# Patient Record
Sex: Female | Born: 1945
Health system: Southern US, Community
[De-identification: ages and names within clinical notes are randomized; demographics above are authoritative.]

## PROBLEM LIST (undated history)

## (undated) DIAGNOSIS — R112 Nausea with vomiting, unspecified: Secondary | ICD-10-CM

## (undated) DIAGNOSIS — K219 Gastro-esophageal reflux disease without esophagitis: Secondary | ICD-10-CM

## (undated) DIAGNOSIS — Z9221 Personal history of antineoplastic chemotherapy: Secondary | ICD-10-CM

## (undated) DIAGNOSIS — R0600 Dyspnea, unspecified: Secondary | ICD-10-CM

## (undated) DIAGNOSIS — M1990 Unspecified osteoarthritis, unspecified site: Secondary | ICD-10-CM

## (undated) DIAGNOSIS — M8580 Other specified disorders of bone density and structure, unspecified site: Secondary | ICD-10-CM

## (undated) DIAGNOSIS — Z9889 Other specified postprocedural states: Secondary | ICD-10-CM

## (undated) DIAGNOSIS — E161 Other hypoglycemia: Secondary | ICD-10-CM

## (undated) DIAGNOSIS — C50919 Malignant neoplasm of unspecified site of unspecified female breast: Secondary | ICD-10-CM

## (undated) DIAGNOSIS — R42 Dizziness and giddiness: Secondary | ICD-10-CM

## (undated) HISTORY — PX: GANGLION CYST EXCISION: SHX1691

## (undated) HISTORY — DX: Gastro-esophageal reflux disease without esophagitis: K21.9

## (undated) HISTORY — DX: Malignant neoplasm of unspecified site of unspecified female breast: C50.919

## (undated) HISTORY — PX: TONSILLECTOMY: SUR1361

## (undated) HISTORY — DX: Other specified disorders of bone density and structure, unspecified site: M85.80

## (undated) HISTORY — PX: SHOULDER SURGERY: SHX246

## (undated) HISTORY — PX: OTHER SURGICAL HISTORY: SHX169

## (undated) HISTORY — PX: CATARACT EXTRACTION, BILATERAL: SHX1313

## (undated) HISTORY — DX: Dizziness and giddiness: R42

## (undated) HISTORY — PX: COLONOSCOPY: SHX174

## (undated) HISTORY — PX: TUBAL LIGATION: SHX77

---

## 2002-10-21 ENCOUNTER — Other Ambulatory Visit: Admission: RE | Admit: 2002-10-21 | Discharge: 2002-10-21 | Payer: Self-pay | Admitting: Obstetrics and Gynecology

## 2003-12-08 ENCOUNTER — Ambulatory Visit: Payer: Self-pay | Admitting: Family Medicine

## 2003-12-18 ENCOUNTER — Ambulatory Visit: Payer: Self-pay | Admitting: Internal Medicine

## 2003-12-21 HISTORY — PX: OTHER SURGICAL HISTORY: SHX169

## 2004-01-03 ENCOUNTER — Ambulatory Visit: Payer: Self-pay

## 2004-01-10 ENCOUNTER — Ambulatory Visit: Payer: Self-pay | Admitting: Family Medicine

## 2004-07-31 ENCOUNTER — Ambulatory Visit: Payer: Self-pay | Admitting: Family Medicine

## 2005-01-20 DIAGNOSIS — R42 Dizziness and giddiness: Secondary | ICD-10-CM

## 2005-01-20 HISTORY — DX: Dizziness and giddiness: R42

## 2005-02-19 ENCOUNTER — Ambulatory Visit: Payer: Self-pay | Admitting: Family Medicine

## 2005-03-25 ENCOUNTER — Ambulatory Visit: Payer: Self-pay | Admitting: Family Medicine

## 2005-04-01 ENCOUNTER — Encounter: Admission: RE | Admit: 2005-04-01 | Discharge: 2005-04-01 | Payer: Self-pay | Admitting: Family Medicine

## 2005-04-03 ENCOUNTER — Ambulatory Visit: Payer: Self-pay | Admitting: Family Medicine

## 2005-04-06 ENCOUNTER — Encounter: Admission: RE | Admit: 2005-04-06 | Discharge: 2005-04-06 | Payer: Self-pay | Admitting: Family Medicine

## 2005-04-15 ENCOUNTER — Ambulatory Visit: Payer: Self-pay | Admitting: Family Medicine

## 2005-04-24 ENCOUNTER — Ambulatory Visit: Payer: Self-pay | Admitting: Family Medicine

## 2005-05-09 ENCOUNTER — Ambulatory Visit: Payer: Self-pay | Admitting: Family Medicine

## 2005-09-18 ENCOUNTER — Ambulatory Visit: Payer: Self-pay | Admitting: Family Medicine

## 2005-10-20 HISTORY — PX: HAND SURGERY: SHX662

## 2006-02-24 ENCOUNTER — Ambulatory Visit: Payer: Self-pay | Admitting: Family Medicine

## 2006-04-02 ENCOUNTER — Encounter: Payer: Self-pay | Admitting: Family Medicine

## 2007-08-03 ENCOUNTER — Encounter: Admission: RE | Admit: 2007-08-03 | Discharge: 2007-08-03 | Payer: Self-pay | Admitting: Family Medicine

## 2007-08-12 ENCOUNTER — Encounter: Admission: RE | Admit: 2007-08-12 | Discharge: 2007-08-12 | Payer: Self-pay | Admitting: Family Medicine

## 2007-08-12 ENCOUNTER — Encounter: Payer: Self-pay | Admitting: Family Medicine

## 2007-08-12 ENCOUNTER — Encounter (INDEPENDENT_AMBULATORY_CARE_PROVIDER_SITE_OTHER): Payer: Self-pay | Admitting: Diagnostic Radiology

## 2007-08-19 ENCOUNTER — Encounter: Admission: RE | Admit: 2007-08-19 | Discharge: 2007-08-19 | Payer: Self-pay | Admitting: Family Medicine

## 2007-08-21 HISTORY — PX: MASTECTOMY: SHX3

## 2007-08-23 ENCOUNTER — Encounter: Payer: Self-pay | Admitting: Family Medicine

## 2007-08-24 ENCOUNTER — Ambulatory Visit: Payer: Self-pay | Admitting: Oncology

## 2007-09-01 ENCOUNTER — Encounter: Payer: Self-pay | Admitting: Family Medicine

## 2007-09-01 ENCOUNTER — Ambulatory Visit (HOSPITAL_COMMUNITY): Admission: RE | Admit: 2007-09-01 | Discharge: 2007-09-01 | Payer: Self-pay | Admitting: Oncology

## 2007-09-03 ENCOUNTER — Ambulatory Visit (HOSPITAL_COMMUNITY): Admission: RE | Admit: 2007-09-03 | Discharge: 2007-09-03 | Payer: Self-pay | Admitting: Oncology

## 2007-09-06 ENCOUNTER — Encounter: Payer: Self-pay | Admitting: Oncology

## 2007-09-06 ENCOUNTER — Ambulatory Visit: Payer: Self-pay

## 2007-09-16 ENCOUNTER — Encounter: Payer: Self-pay | Admitting: Family Medicine

## 2007-09-16 ENCOUNTER — Encounter (INDEPENDENT_AMBULATORY_CARE_PROVIDER_SITE_OTHER): Payer: Self-pay | Admitting: Surgery

## 2007-09-16 ENCOUNTER — Ambulatory Visit (HOSPITAL_BASED_OUTPATIENT_CLINIC_OR_DEPARTMENT_OTHER): Admission: RE | Admit: 2007-09-16 | Discharge: 2007-09-17 | Payer: Self-pay | Admitting: Surgery

## 2007-09-16 DIAGNOSIS — C50919 Malignant neoplasm of unspecified site of unspecified female breast: Secondary | ICD-10-CM

## 2007-09-16 HISTORY — DX: Malignant neoplasm of unspecified site of unspecified female breast: C50.919

## 2007-10-05 ENCOUNTER — Ambulatory Visit: Payer: Self-pay | Admitting: Oncology

## 2007-10-07 ENCOUNTER — Encounter: Payer: Self-pay | Admitting: Family Medicine

## 2007-10-12 LAB — CBC WITH DIFFERENTIAL/PLATELET
Basophils Absolute: 0.1 10*3/uL (ref 0.0–0.1)
Eosinophils Absolute: 0.2 10*3/uL (ref 0.0–0.5)
HGB: 13.7 g/dL (ref 11.6–15.9)
MCV: 86.4 fL (ref 81.0–101.0)
MONO#: 0.7 10*3/uL (ref 0.1–0.9)
MONO%: 10.2 % (ref 0.0–13.0)
NEUT#: 4.4 10*3/uL (ref 1.5–6.5)
RDW: 11.7 % (ref 11.3–14.5)
lymph#: 1.5 10*3/uL (ref 0.9–3.3)

## 2007-10-19 LAB — CBC WITH DIFFERENTIAL/PLATELET
Basophils Absolute: 0 10*3/uL (ref 0.0–0.1)
Eosinophils Absolute: 0.2 10*3/uL (ref 0.0–0.5)
HCT: 39 % (ref 34.8–46.6)
HGB: 13.4 g/dL (ref 11.6–15.9)
LYMPH%: 30.1 % (ref 14.0–48.0)
MCV: 88.7 fL (ref 81.0–101.0)
MONO%: 7.8 % (ref 0.0–13.0)
NEUT#: 3.6 10*3/uL (ref 1.5–6.5)
NEUT%: 58.8 % (ref 39.6–76.8)
Platelets: 243 10*3/uL (ref 145–400)

## 2007-10-25 ENCOUNTER — Encounter: Payer: Self-pay | Admitting: Family Medicine

## 2007-10-25 LAB — CBC WITH DIFFERENTIAL/PLATELET
BASO%: 0.7 % (ref 0.0–2.0)
Basophils Absolute: 0 10*3/uL (ref 0.0–0.1)
EOS%: 1.9 % (ref 0.0–7.0)
HGB: 13.1 g/dL (ref 11.6–15.9)
MCH: 30.6 pg (ref 26.0–34.0)
RDW: 12.4 % (ref 11.3–14.5)
lymph#: 1.4 10*3/uL (ref 0.9–3.3)

## 2007-11-02 ENCOUNTER — Encounter: Payer: Self-pay | Admitting: Family Medicine

## 2007-11-02 LAB — CBC WITH DIFFERENTIAL/PLATELET
Basophils Absolute: 0 10*3/uL (ref 0.0–0.1)
Eosinophils Absolute: 0.1 10*3/uL (ref 0.0–0.5)
HGB: 13.1 g/dL (ref 11.6–15.9)
MCV: 89.3 fL (ref 81.0–101.0)
MONO#: 0.4 10*3/uL (ref 0.1–0.9)
NEUT#: 4.3 10*3/uL (ref 1.5–6.5)
RDW: 12.9 % (ref 11.3–14.5)
lymph#: 1.5 10*3/uL (ref 0.9–3.3)

## 2007-11-09 ENCOUNTER — Encounter: Payer: Self-pay | Admitting: Family Medicine

## 2007-11-09 LAB — CBC WITH DIFFERENTIAL/PLATELET
Basophils Absolute: 0 10*3/uL (ref 0.0–0.1)
Eosinophils Absolute: 0.1 10*3/uL (ref 0.0–0.5)
HCT: 37.7 % (ref 34.8–46.6)
HGB: 12.9 g/dL (ref 11.6–15.9)
LYMPH%: 21.5 % (ref 14.0–48.0)
MCV: 89.8 fL (ref 81.0–101.0)
MONO%: 11.6 % (ref 0.0–13.0)
NEUT#: 4.5 10*3/uL (ref 1.5–6.5)
Platelets: 324 10*3/uL (ref 145–400)

## 2007-11-09 LAB — COMPREHENSIVE METABOLIC PANEL
Albumin: 3.9 g/dL (ref 3.5–5.2)
Alkaline Phosphatase: 70 U/L (ref 39–117)
BUN: 17 mg/dL (ref 6–23)
CO2: 22 mEq/L (ref 19–32)
Glucose, Bld: 99 mg/dL (ref 70–99)
Potassium: 4.2 mEq/L (ref 3.5–5.3)
Total Bilirubin: 0.4 mg/dL (ref 0.3–1.2)

## 2007-11-16 LAB — CBC WITH DIFFERENTIAL/PLATELET
Basophils Absolute: 0.1 10*3/uL (ref 0.0–0.1)
Eosinophils Absolute: 0.2 10*3/uL (ref 0.0–0.5)
HGB: 12.9 g/dL (ref 11.6–15.9)
MCV: 89.9 fL (ref 81.0–101.0)
MONO#: 0.5 10*3/uL (ref 0.1–0.9)
MONO%: 8 % (ref 0.0–13.0)
NEUT#: 4 10*3/uL (ref 1.5–6.5)
RDW: 13.4 % (ref 11.3–14.5)
WBC: 6.2 10*3/uL (ref 3.9–10.0)

## 2007-11-23 ENCOUNTER — Ambulatory Visit: Payer: Self-pay | Admitting: Oncology

## 2007-11-23 LAB — CBC WITH DIFFERENTIAL/PLATELET
BASO%: 0.7 % (ref 0.0–2.0)
EOS%: 3.7 % (ref 0.0–7.0)
LYMPH%: 20.1 % (ref 14.0–48.0)
MCH: 31 pg (ref 26.0–34.0)
MCHC: 34.1 g/dL (ref 32.0–36.0)
MONO#: 0.6 10*3/uL (ref 0.1–0.9)
RBC: 3.9 10*6/uL (ref 3.70–5.32)
WBC: 5.8 10*3/uL (ref 3.9–10.0)
lymph#: 1.2 10*3/uL (ref 0.9–3.3)

## 2007-11-30 LAB — CBC WITH DIFFERENTIAL/PLATELET
BASO%: 0.8 % (ref 0.0–2.0)
EOS%: 2.5 % (ref 0.0–7.0)
HCT: 34.5 % — ABNORMAL LOW (ref 34.8–46.6)
HGB: 12 g/dL (ref 11.6–15.9)
MCH: 31.3 pg (ref 26.0–34.0)
MCHC: 34.7 g/dL (ref 32.0–36.0)
MONO#: 0.6 10*3/uL (ref 0.1–0.9)
NEUT%: 64.4 % (ref 39.6–76.8)
RDW: 14.3 % (ref 11.3–14.5)
WBC: 6.9 10*3/uL (ref 3.9–10.0)
lymph#: 1.6 10*3/uL (ref 0.9–3.3)

## 2007-12-07 LAB — COMPREHENSIVE METABOLIC PANEL
ALT: 9 U/L (ref 0–35)
AST: 11 U/L (ref 0–37)
Albumin: 3.8 g/dL (ref 3.5–5.2)
CO2: 26 mEq/L (ref 19–32)
Calcium: 9 mg/dL (ref 8.4–10.5)
Chloride: 107 mEq/L (ref 96–112)
Creatinine, Ser: 0.9 mg/dL (ref 0.40–1.20)
Potassium: 4.5 mEq/L (ref 3.5–5.3)
Total Protein: 6.2 g/dL (ref 6.0–8.3)

## 2007-12-07 LAB — CBC WITH DIFFERENTIAL/PLATELET
BASO%: 1.7 % (ref 0.0–2.0)
EOS%: 2.2 % (ref 0.0–7.0)
MCH: 30.5 pg (ref 26.0–34.0)
MCHC: 34.7 g/dL (ref 32.0–36.0)
MONO#: 0.8 10*3/uL (ref 0.1–0.9)
NEUT%: 64.4 % (ref 39.6–76.8)
RBC: 3.98 10*6/uL (ref 3.70–5.32)
RDW: 12.6 % (ref 11.3–14.5)
WBC: 7.2 10*3/uL (ref 3.9–10.0)
lymph#: 1.4 10*3/uL (ref 0.9–3.3)

## 2007-12-14 LAB — CBC WITH DIFFERENTIAL/PLATELET
BASO%: 1.3 % (ref 0.0–2.0)
Basophils Absolute: 0.1 10*3/uL (ref 0.0–0.1)
EOS%: 2.7 % (ref 0.0–7.0)
HCT: 38.4 % (ref 34.8–46.6)
HGB: 13 g/dL (ref 11.6–15.9)
MCH: 31 pg (ref 26.0–34.0)
MONO#: 0.5 10*3/uL (ref 0.1–0.9)
NEUT#: 4.2 10*3/uL (ref 1.5–6.5)
NEUT%: 64 % (ref 39.6–76.8)
RDW: 13.9 % (ref 11.3–14.5)
WBC: 6.6 10*3/uL (ref 3.9–10.0)
lymph#: 1.6 10*3/uL (ref 0.9–3.3)

## 2007-12-21 ENCOUNTER — Encounter: Payer: Self-pay | Admitting: Family Medicine

## 2007-12-21 LAB — CBC WITH DIFFERENTIAL/PLATELET
BASO%: 0.6 % (ref 0.0–2.0)
LYMPH%: 22.7 % (ref 14.0–48.0)
MCH: 31.3 pg (ref 26.0–34.0)
MCHC: 34.2 g/dL (ref 32.0–36.0)
MCV: 91.7 fL (ref 81.0–101.0)
MONO%: 5.4 % (ref 0.0–13.0)
Platelets: 293 10*3/uL (ref 145–400)
RBC: 3.96 10*6/uL (ref 3.70–5.32)

## 2008-01-03 ENCOUNTER — Encounter: Payer: Self-pay | Admitting: Family Medicine

## 2008-01-03 LAB — CBC WITH DIFFERENTIAL/PLATELET
BASO%: 0.7 % (ref 0.0–2.0)
EOS%: 1.4 % (ref 0.0–7.0)
LYMPH%: 17.5 % (ref 14.0–48.0)
MCH: 31.5 pg (ref 26.0–34.0)
MCHC: 34.5 g/dL (ref 32.0–36.0)
MONO#: 0.9 10*3/uL (ref 0.1–0.9)
RBC: 4.01 10*6/uL (ref 3.70–5.32)
WBC: 8.7 10*3/uL (ref 3.9–10.0)
lymph#: 1.5 10*3/uL (ref 0.9–3.3)

## 2008-01-03 LAB — COMPREHENSIVE METABOLIC PANEL
ALT: 16 U/L (ref 0–35)
AST: 15 U/L (ref 0–37)
CO2: 23 mEq/L (ref 19–32)
Chloride: 106 mEq/L (ref 96–112)
Sodium: 143 mEq/L (ref 135–145)
Total Bilirubin: 0.4 mg/dL (ref 0.3–1.2)
Total Protein: 6.7 g/dL (ref 6.0–8.3)

## 2008-01-10 ENCOUNTER — Encounter: Payer: Self-pay | Admitting: Oncology

## 2008-01-10 ENCOUNTER — Ambulatory Visit: Payer: Self-pay

## 2008-01-11 ENCOUNTER — Ambulatory Visit: Payer: Self-pay | Admitting: Oncology

## 2008-01-11 LAB — CBC WITH DIFFERENTIAL/PLATELET
HCT: 37.7 % (ref 34.8–46.6)
LYMPH%: 27.5 % (ref 14.0–48.0)
MCH: 31.6 pg (ref 26.0–34.0)
MCHC: 34.4 g/dL (ref 32.0–36.0)
MONO#: 0.4 10*3/uL (ref 0.1–0.9)
MONO%: 7.3 % (ref 0.0–13.0)
NEUT%: 62.6 % (ref 39.6–76.8)
Platelets: 336 10*3/uL (ref 145–400)
RBC: 4.1 10*6/uL (ref 3.70–5.32)
WBC: 5.9 10*3/uL (ref 3.9–10.0)

## 2008-01-18 ENCOUNTER — Encounter: Payer: Self-pay | Admitting: Family Medicine

## 2008-01-18 LAB — COMPREHENSIVE METABOLIC PANEL
Albumin: 4.1 g/dL (ref 3.5–5.2)
BUN: 16 mg/dL (ref 6–23)
CO2: 24 mEq/L (ref 19–32)
Calcium: 8.9 mg/dL (ref 8.4–10.5)
Chloride: 108 mEq/L (ref 96–112)
Glucose, Bld: 86 mg/dL (ref 70–99)
Potassium: 4.1 mEq/L (ref 3.5–5.3)

## 2008-01-18 LAB — CBC WITH DIFFERENTIAL/PLATELET
BASO%: 0.7 % (ref 0.0–2.0)
LYMPH%: 25.5 % (ref 14.0–48.0)
MCH: 31.9 pg (ref 26.0–34.0)
MCHC: 34.7 g/dL (ref 32.0–36.0)
MCV: 92.1 fL (ref 81.0–101.0)
MONO%: 5.3 % (ref 0.0–13.0)
NEUT%: 67.1 % (ref 39.6–76.8)
Platelets: 280 10*3/uL (ref 145–400)
RBC: 3.84 10*6/uL (ref 3.70–5.32)
WBC: 5.9 10*3/uL (ref 3.9–10.0)

## 2008-01-25 LAB — COMPREHENSIVE METABOLIC PANEL
ALT: 13 U/L (ref 0–35)
AST: 14 U/L (ref 0–37)
Albumin: 4.3 g/dL (ref 3.5–5.2)
Calcium: 8.9 mg/dL (ref 8.4–10.5)
Glucose, Bld: 86 mg/dL (ref 70–99)
Sodium: 139 mEq/L (ref 135–145)
Total Protein: 6.6 g/dL (ref 6.0–8.3)

## 2008-01-25 LAB — CBC WITH DIFFERENTIAL/PLATELET
Eosinophils Absolute: 0.1 10*3/uL (ref 0.0–0.5)
LYMPH%: 25.9 % (ref 14.0–48.0)
MCV: 93.2 fL (ref 81.0–101.0)
MONO%: 5.6 % (ref 0.0–13.0)
NEUT#: 4.6 10*3/uL (ref 1.5–6.5)
Platelets: 314 10*3/uL (ref 145–400)
RBC: 3.99 10*6/uL (ref 3.70–5.32)

## 2008-02-15 ENCOUNTER — Encounter: Payer: Self-pay | Admitting: Family Medicine

## 2008-02-15 LAB — COMPREHENSIVE METABOLIC PANEL
ALT: 10 U/L (ref 0–35)
AST: 14 U/L (ref 0–37)
Alkaline Phosphatase: 55 U/L (ref 39–117)
Creatinine, Ser: 0.85 mg/dL (ref 0.40–1.20)
Sodium: 140 mEq/L (ref 135–145)
Total Bilirubin: 0.4 mg/dL (ref 0.3–1.2)
Total Protein: 6.2 g/dL (ref 6.0–8.3)

## 2008-03-03 ENCOUNTER — Ambulatory Visit: Payer: Self-pay | Admitting: Oncology

## 2008-03-07 ENCOUNTER — Encounter: Payer: Self-pay | Admitting: Family Medicine

## 2008-03-07 LAB — CBC WITH DIFFERENTIAL/PLATELET
Basophils Absolute: 0 10*3/uL (ref 0.0–0.1)
Eosinophils Absolute: 0.1 10*3/uL (ref 0.0–0.5)
HCT: 41.7 % (ref 34.8–46.6)
HGB: 14 g/dL (ref 11.6–15.9)
LYMPH%: 21.4 % (ref 14.0–48.0)
MCV: 91.3 fL (ref 81.0–101.0)
MONO%: 6.2 % (ref 0.0–13.0)
NEUT#: 5 10*3/uL (ref 1.5–6.5)
Platelets: 297 10*3/uL (ref 145–400)
RDW: 13.3 % (ref 11.3–14.5)

## 2008-03-07 LAB — COMPREHENSIVE METABOLIC PANEL
Alkaline Phosphatase: 66 U/L (ref 39–117)
BUN: 16 mg/dL (ref 6–23)
Creatinine, Ser: 0.89 mg/dL (ref 0.40–1.20)
Glucose, Bld: 67 mg/dL — ABNORMAL LOW (ref 70–99)
Total Bilirubin: 0.5 mg/dL (ref 0.3–1.2)

## 2008-03-28 LAB — CBC WITH DIFFERENTIAL/PLATELET
Basophils Absolute: 0.1 10*3/uL (ref 0.0–0.1)
Eosinophils Absolute: 0.1 10*3/uL (ref 0.0–0.5)
HGB: 13.4 g/dL (ref 11.6–15.9)
MCV: 89.7 fL (ref 79.5–101.0)
MONO#: 0.7 10*3/uL (ref 0.1–0.9)
MONO%: 8.6 % (ref 0.0–14.0)
NEUT#: 5.2 10*3/uL (ref 1.5–6.5)
RBC: 4.42 10*6/uL (ref 3.70–5.45)
RDW: 12.6 % (ref 11.2–14.5)
WBC: 7.9 10*3/uL (ref 3.9–10.3)
lymph#: 1.9 10*3/uL (ref 0.9–3.3)

## 2008-04-05 ENCOUNTER — Ambulatory Visit: Admission: RE | Admit: 2008-04-05 | Discharge: 2008-04-05 | Payer: Self-pay | Admitting: Oncology

## 2008-04-05 ENCOUNTER — Ambulatory Visit: Payer: Self-pay | Admitting: Cardiology

## 2008-04-05 ENCOUNTER — Encounter: Payer: Self-pay | Admitting: Oncology

## 2008-04-14 ENCOUNTER — Ambulatory Visit: Payer: Self-pay | Admitting: Oncology

## 2008-04-18 ENCOUNTER — Encounter: Payer: Self-pay | Admitting: Family Medicine

## 2008-04-18 LAB — CBC WITH DIFFERENTIAL/PLATELET
Basophils Absolute: 0 10*3/uL (ref 0.0–0.1)
Eosinophils Absolute: 0.1 10*3/uL (ref 0.0–0.5)
HGB: 13.7 g/dL (ref 11.6–15.9)
LYMPH%: 22.9 % (ref 14.0–49.7)
MCV: 88.7 fL (ref 79.5–101.0)
MONO#: 0.5 10*3/uL (ref 0.1–0.9)
MONO%: 7.1 % (ref 0.0–14.0)
NEUT#: 4.7 10*3/uL (ref 1.5–6.5)
Platelets: 255 10*3/uL (ref 145–400)
RBC: 4.56 10*6/uL (ref 3.70–5.45)
WBC: 6.8 10*3/uL (ref 3.9–10.3)

## 2008-04-18 LAB — COMPREHENSIVE METABOLIC PANEL
ALT: 9 U/L (ref 0–35)
Alkaline Phosphatase: 62 U/L (ref 39–117)
Sodium: 139 mEq/L (ref 135–145)
Total Bilirubin: 0.4 mg/dL (ref 0.3–1.2)
Total Protein: 6.7 g/dL (ref 6.0–8.3)

## 2008-05-02 ENCOUNTER — Encounter: Payer: Self-pay | Admitting: Family Medicine

## 2008-05-02 ENCOUNTER — Telehealth: Payer: Self-pay | Admitting: Family Medicine

## 2008-05-06 ENCOUNTER — Encounter: Payer: Self-pay | Admitting: Family Medicine

## 2008-05-09 LAB — CBC WITH DIFFERENTIAL/PLATELET
BASO%: 0.5 % (ref 0.0–2.0)
Basophils Absolute: 0 10*3/uL (ref 0.0–0.1)
EOS%: 1.1 % (ref 0.0–7.0)
HGB: 14 g/dL (ref 11.6–15.9)
MCH: 30 pg (ref 25.1–34.0)
MCHC: 34.1 g/dL (ref 31.5–36.0)
MCV: 87.9 fL (ref 79.5–101.0)
MONO%: 7.9 % (ref 0.0–14.0)
RBC: 4.67 10*6/uL (ref 3.70–5.45)
RDW: 12.5 % (ref 11.2–14.5)
lymph#: 1.5 10*3/uL (ref 0.9–3.3)

## 2008-05-26 ENCOUNTER — Ambulatory Visit: Payer: Self-pay | Admitting: Oncology

## 2008-05-30 ENCOUNTER — Encounter: Payer: Self-pay | Admitting: Family Medicine

## 2008-05-30 LAB — CBC WITH DIFFERENTIAL/PLATELET
Basophils Absolute: 0 10*3/uL (ref 0.0–0.1)
EOS%: 1.4 % (ref 0.0–7.0)
Eosinophils Absolute: 0.1 10*3/uL (ref 0.0–0.5)
RBC: 4.51 10*6/uL (ref 3.70–5.45)
RDW: 12.8 % (ref 11.2–14.5)
WBC: 7.2 10*3/uL (ref 3.9–10.3)
lymph#: 1.5 10*3/uL (ref 0.9–3.3)

## 2008-05-30 LAB — COMPREHENSIVE METABOLIC PANEL
AST: 16 U/L (ref 0–37)
Albumin: 4.2 g/dL (ref 3.5–5.2)
Alkaline Phosphatase: 64 U/L (ref 39–117)
BUN: 17 mg/dL (ref 6–23)
Calcium: 9.4 mg/dL (ref 8.4–10.5)
Chloride: 107 mEq/L (ref 96–112)
Potassium: 4.1 mEq/L (ref 3.5–5.3)
Sodium: 142 mEq/L (ref 135–145)
Total Protein: 6.7 g/dL (ref 6.0–8.3)

## 2008-06-20 LAB — CBC WITH DIFFERENTIAL/PLATELET
EOS%: 1.8 % (ref 0.0–7.0)
MCH: 30.3 pg (ref 25.1–34.0)
MCHC: 34.3 g/dL (ref 31.5–36.0)
MCV: 88.3 fL (ref 79.5–101.0)
MONO%: 8.2 % (ref 0.0–14.0)
RBC: 4.36 10*6/uL (ref 3.70–5.45)
RDW: 13.4 % (ref 11.2–14.5)

## 2008-06-27 ENCOUNTER — Ambulatory Visit: Payer: Self-pay | Admitting: Family Medicine

## 2008-06-30 ENCOUNTER — Telehealth: Payer: Self-pay | Admitting: Family Medicine

## 2008-07-04 ENCOUNTER — Ambulatory Visit: Payer: Self-pay | Admitting: Internal Medicine

## 2008-07-04 ENCOUNTER — Ambulatory Visit: Admission: RE | Admit: 2008-07-04 | Discharge: 2008-07-04 | Payer: Self-pay | Admitting: Oncology

## 2008-07-04 ENCOUNTER — Encounter: Payer: Self-pay | Admitting: Oncology

## 2008-07-07 ENCOUNTER — Ambulatory Visit: Payer: Self-pay | Admitting: Oncology

## 2008-07-11 LAB — CBC WITH DIFFERENTIAL/PLATELET
BASO%: 0.6 % (ref 0.0–2.0)
Eosinophils Absolute: 0.1 10*3/uL (ref 0.0–0.5)
MONO#: 0.7 10*3/uL (ref 0.1–0.9)
MONO%: 8.8 % (ref 0.0–14.0)
NEUT#: 5 10*3/uL (ref 1.5–6.5)
RBC: 4.41 10*6/uL (ref 3.70–5.45)
RDW: 13.3 % (ref 11.2–14.5)
WBC: 7.6 10*3/uL (ref 3.9–10.3)

## 2008-07-11 LAB — COMPREHENSIVE METABOLIC PANEL
ALT: 11 U/L (ref 0–35)
Albumin: 4.1 g/dL (ref 3.5–5.2)
Alkaline Phosphatase: 64 U/L (ref 39–117)
CO2: 23 mEq/L (ref 19–32)
Glucose, Bld: 88 mg/dL (ref 70–99)
Potassium: 4.3 mEq/L (ref 3.5–5.3)
Sodium: 140 mEq/L (ref 135–145)
Total Protein: 6.6 g/dL (ref 6.0–8.3)

## 2008-07-31 ENCOUNTER — Ambulatory Visit: Payer: Self-pay | Admitting: Family Medicine

## 2008-07-31 LAB — CONVERTED CEMR LAB
BUN: 15 mg/dL (ref 6–23)
CO2: 28 meq/L (ref 19–32)
Calcium: 9 mg/dL (ref 8.4–10.5)
Chloride: 107 meq/L (ref 96–112)
Creatinine, Ser: 0.9 mg/dL (ref 0.4–1.2)
GFR calc non Af Amer: 67.06 mL/min (ref >60)
Glucose, Bld: 74 mg/dL (ref 70–99)
Potassium: 4.4 meq/L (ref 3.5–5.1)
Sodium: 142 meq/L (ref 135–145)

## 2008-08-01 LAB — CBC WITH DIFFERENTIAL/PLATELET
Basophils Absolute: 0 10*3/uL (ref 0.0–0.1)
EOS%: 1.2 % (ref 0.0–7.0)
HCT: 38.5 % (ref 34.8–46.6)
HGB: 13.4 g/dL (ref 11.6–15.9)
MCH: 30.7 pg (ref 25.1–34.0)
MCV: 88 fL (ref 79.5–101.0)
MONO%: 8.4 % (ref 0.0–14.0)
NEUT%: 65.7 % (ref 38.4–76.8)
RDW: 12.9 % (ref 11.2–14.5)

## 2008-08-03 ENCOUNTER — Encounter: Admission: RE | Admit: 2008-08-03 | Discharge: 2008-08-03 | Payer: Self-pay | Admitting: Oncology

## 2008-08-04 ENCOUNTER — Encounter: Admission: RE | Admit: 2008-08-04 | Discharge: 2008-08-04 | Payer: Self-pay | Admitting: Family Medicine

## 2008-08-08 ENCOUNTER — Encounter (INDEPENDENT_AMBULATORY_CARE_PROVIDER_SITE_OTHER): Payer: Self-pay | Admitting: *Deleted

## 2008-08-18 ENCOUNTER — Ambulatory Visit: Payer: Self-pay | Admitting: Oncology

## 2008-08-22 LAB — CBC WITH DIFFERENTIAL/PLATELET
Eosinophils Absolute: 0.1 10*3/uL (ref 0.0–0.5)
HCT: 39.8 % (ref 34.8–46.6)
HGB: 13.5 g/dL (ref 11.6–15.9)
LYMPH%: 19.6 % (ref 14.0–49.7)
MONO#: 0.6 10*3/uL (ref 0.1–0.9)
NEUT#: 5 10*3/uL (ref 1.5–6.5)
NEUT%: 70.2 % (ref 38.4–76.8)
Platelets: 232 10*3/uL (ref 145–400)
WBC: 7.2 10*3/uL (ref 3.9–10.3)

## 2008-09-12 ENCOUNTER — Encounter: Payer: Self-pay | Admitting: Family Medicine

## 2008-09-12 LAB — CBC WITH DIFFERENTIAL/PLATELET
BASO%: 0.6 % (ref 0.0–2.0)
Basophils Absolute: 0 10*3/uL (ref 0.0–0.1)
HCT: 39.7 % (ref 34.8–46.6)
LYMPH%: 21.7 % (ref 14.0–49.7)
MCH: 30.3 pg (ref 25.1–34.0)
MCHC: 34.1 g/dL (ref 31.5–36.0)
MONO#: 0.6 10*3/uL (ref 0.1–0.9)
NEUT%: 68.7 % (ref 38.4–76.8)
Platelets: 257 10*3/uL (ref 145–400)

## 2008-09-12 LAB — COMPREHENSIVE METABOLIC PANEL
AST: 15 U/L (ref 0–37)
Albumin: 3.9 g/dL (ref 3.5–5.2)
Alkaline Phosphatase: 59 U/L (ref 39–117)
BUN: 19 mg/dL (ref 6–23)
Potassium: 4.2 mEq/L (ref 3.5–5.3)

## 2008-09-28 ENCOUNTER — Ambulatory Visit: Payer: Self-pay | Admitting: Cardiology

## 2008-09-28 ENCOUNTER — Ambulatory Visit: Admission: RE | Admit: 2008-09-28 | Discharge: 2008-09-28 | Payer: Self-pay | Admitting: Oncology

## 2008-09-28 ENCOUNTER — Encounter: Payer: Self-pay | Admitting: Oncology

## 2008-09-29 ENCOUNTER — Ambulatory Visit: Payer: Self-pay | Admitting: Oncology

## 2008-10-03 ENCOUNTER — Encounter: Payer: Self-pay | Admitting: Family Medicine

## 2008-10-03 LAB — CBC WITH DIFFERENTIAL/PLATELET
Basophils Absolute: 0.1 10*3/uL (ref 0.0–0.1)
Eosinophils Absolute: 0.1 10*3/uL (ref 0.0–0.5)
LYMPH%: 22 % (ref 14.0–49.7)
MCV: 89.3 fL (ref 79.5–101.0)
MONO%: 6.4 % (ref 0.0–14.0)
NEUT#: 5.1 10*3/uL (ref 1.5–6.5)
Platelets: 264 10*3/uL (ref 145–400)
RBC: 4.45 10*6/uL (ref 3.70–5.45)

## 2008-10-03 LAB — COMPREHENSIVE METABOLIC PANEL
Alkaline Phosphatase: 62 U/L (ref 39–117)
BUN: 20 mg/dL (ref 6–23)
Creatinine, Ser: 0.86 mg/dL (ref 0.40–1.20)
Glucose, Bld: 89 mg/dL (ref 70–99)
Sodium: 142 mEq/L (ref 135–145)
Total Bilirubin: 0.4 mg/dL (ref 0.3–1.2)

## 2008-10-05 ENCOUNTER — Encounter: Admission: RE | Admit: 2008-10-05 | Discharge: 2008-10-05 | Payer: Self-pay | Admitting: Surgery

## 2008-10-09 ENCOUNTER — Telehealth (INDEPENDENT_AMBULATORY_CARE_PROVIDER_SITE_OTHER): Payer: Self-pay | Admitting: *Deleted

## 2008-10-10 ENCOUNTER — Ambulatory Visit (HOSPITAL_COMMUNITY): Admission: RE | Admit: 2008-10-10 | Discharge: 2008-10-10 | Payer: Self-pay | Admitting: Surgery

## 2009-01-20 HISTORY — PX: HAND SURGERY: SHX662

## 2009-03-30 ENCOUNTER — Ambulatory Visit: Payer: Self-pay | Admitting: Oncology

## 2009-04-03 ENCOUNTER — Encounter: Payer: Self-pay | Admitting: Family Medicine

## 2009-04-03 LAB — COMPREHENSIVE METABOLIC PANEL
Albumin: 4.3 g/dL (ref 3.5–5.2)
BUN: 14 mg/dL (ref 6–23)
CO2: 24 mEq/L (ref 19–32)
Calcium: 9.3 mg/dL (ref 8.4–10.5)
Chloride: 106 mEq/L (ref 96–112)
Glucose, Bld: 86 mg/dL (ref 70–99)
Potassium: 4.1 mEq/L (ref 3.5–5.3)

## 2009-04-03 LAB — CBC WITH DIFFERENTIAL/PLATELET
Basophils Absolute: 0.1 10*3/uL (ref 0.0–0.1)
Eosinophils Absolute: 0.1 10*3/uL (ref 0.0–0.5)
HCT: 40.7 % (ref 34.8–46.6)
HGB: 14 g/dL (ref 11.6–15.9)
NEUT#: 5.1 10*3/uL (ref 1.5–6.5)
NEUT%: 65.3 % (ref 38.4–76.8)
RDW: 12.5 % (ref 11.2–14.5)
lymph#: 1.9 10*3/uL (ref 0.9–3.3)

## 2009-04-03 LAB — CANCER ANTIGEN 27.29: CA 27.29: 16 U/mL (ref 0–39)

## 2009-04-03 LAB — LACTATE DEHYDROGENASE: LDH: 136 U/L (ref 94–250)

## 2009-04-03 LAB — VITAMIN D 25 HYDROXY (VIT D DEFICIENCY, FRACTURES): Vit D, 25-Hydroxy: 31 ng/mL (ref 30–89)

## 2009-07-20 LAB — HM MAMMOGRAPHY: HM Mammogram: NORMAL

## 2009-08-07 ENCOUNTER — Encounter: Admission: RE | Admit: 2009-08-07 | Discharge: 2009-08-07 | Payer: Self-pay | Admitting: Oncology

## 2009-10-11 LAB — CONVERTED CEMR LAB: Pap Smear: NORMAL

## 2009-10-12 ENCOUNTER — Ambulatory Visit: Payer: Self-pay | Admitting: Oncology

## 2009-10-16 LAB — CBC WITH DIFFERENTIAL/PLATELET
BASO%: 0.7 % (ref 0.0–2.0)
EOS%: 1.7 % (ref 0.0–7.0)
HCT: 42.8 % (ref 34.8–46.6)
MCH: 30.3 pg (ref 25.1–34.0)
MCHC: 33.6 g/dL (ref 31.5–36.0)
MONO#: 0.5 10*3/uL (ref 0.1–0.9)
NEUT%: 66.5 % (ref 38.4–76.8)
RBC: 4.75 10*6/uL (ref 3.70–5.45)
RDW: 12.5 % (ref 11.2–14.5)
WBC: 8.2 10*3/uL (ref 3.9–10.3)
lymph#: 2.1 10*3/uL (ref 0.9–3.3)
nRBC: 0 % (ref 0–0)

## 2009-10-16 LAB — COMPREHENSIVE METABOLIC PANEL
ALT: 13 U/L (ref 0–35)
AST: 13 U/L (ref 0–37)
Albumin: 4.3 g/dL (ref 3.5–5.2)
Alkaline Phosphatase: 67 U/L (ref 39–117)
BUN: 21 mg/dL (ref 6–23)
Chloride: 105 mEq/L (ref 96–112)
Potassium: 3.8 mEq/L (ref 3.5–5.3)

## 2009-10-16 LAB — VITAMIN D 25 HYDROXY (VIT D DEFICIENCY, FRACTURES): Vit D, 25-Hydroxy: 34 ng/mL (ref 30–89)

## 2009-10-16 LAB — AFP TUMOR MARKER: AFP-Tumor Marker: 4.9 ng/mL (ref 0.0–8.0)

## 2009-10-30 ENCOUNTER — Encounter: Payer: Self-pay | Admitting: Family Medicine

## 2009-11-09 ENCOUNTER — Telehealth: Payer: Self-pay | Admitting: Family Medicine

## 2009-11-14 ENCOUNTER — Encounter: Admission: RE | Admit: 2009-11-14 | Discharge: 2009-11-14 | Payer: Self-pay | Admitting: Oncology

## 2010-01-08 ENCOUNTER — Ambulatory Visit: Payer: Self-pay | Admitting: Family Medicine

## 2010-01-08 LAB — CONVERTED CEMR LAB
Albumin: 3.8 g/dL (ref 3.5–5.2)
BUN: 13 mg/dL (ref 6–23)
CO2: 27 meq/L (ref 19–32)
Calcium: 9.4 mg/dL (ref 8.4–10.5)
Chloride: 104 meq/L (ref 96–112)
Cholesterol: 182 mg/dL (ref 0–200)
Creatinine, Ser: 0.9 mg/dL (ref 0.4–1.2)
GFR calc non Af Amer: 64.28 mL/min (ref >60.00)
Glucose, Bld: 78 mg/dL (ref 70–99)
HDL: 40.6 mg/dL (ref >39.00)
LDL Cholesterol: 127 mg/dL — ABNORMAL HIGH (ref 0–99)
Phosphorus: 4.2 mg/dL (ref 2.3–4.6)
Potassium: 4.3 meq/L (ref 3.5–5.1)
Sodium: 138 meq/L (ref 135–145)
Total CHOL/HDL Ratio: 4
Triglycerides: 73 mg/dL (ref 0.0–149.0)
VLDL: 14.6 mg/dL (ref 0.0–40.0)

## 2010-01-10 ENCOUNTER — Telehealth (INDEPENDENT_AMBULATORY_CARE_PROVIDER_SITE_OTHER): Payer: Self-pay | Admitting: *Deleted

## 2010-01-23 ENCOUNTER — Ambulatory Visit
Admission: RE | Admit: 2010-01-23 | Discharge: 2010-01-23 | Payer: Self-pay | Source: Home / Self Care | Attending: Family Medicine | Admitting: Family Medicine

## 2010-01-23 DIAGNOSIS — Z853 Personal history of malignant neoplasm of breast: Secondary | ICD-10-CM | POA: Insufficient documentation

## 2010-01-23 DIAGNOSIS — K219 Gastro-esophageal reflux disease without esophagitis: Secondary | ICD-10-CM | POA: Insufficient documentation

## 2010-02-11 ENCOUNTER — Encounter: Payer: Self-pay | Admitting: Oncology

## 2010-02-19 NOTE — Letter (Signed)
[facility]   Imported By: Lanelle Bal 04/24/2009 12:00:49  _____________________________________________________________________  External Attachment:    Type:   Image     Comment:   External Document

## 2010-02-19 NOTE — Progress Notes (Signed)
Summary: seeing black dots  Phone Note Call from Patient   Caller: Patient Summary of Call: Pt called and said she is seeing black dots, feels fine otherwise.  I suggested that she call her eye doctor. Initial call taken by: Lowella Petties CMA,  November 09, 2009 3:08 PM  Follow-up for Phone Call        that can come from a lot of different things - including migraine  if worse or loss of vision or other symptoms - call back or seek care  I do agree with plan to see eye doc - let me know if she needs ref Follow-up by: Judith Part MD,  November 09, 2009 3:10 PM  Additional Follow-up for Phone Call Additional follow up Details #1::        I advised pt to call back here if no help with her eye doctor. Additional Follow-up by: Lowella Petties CMA,  November 09, 2009 3:15 PM

## 2010-02-19 NOTE — Letter (Signed)
Summary[facility]   Imported By: Maryln Gottron 11/21/2009 14:29:17  _____________________________________________________________________  External Attachment:    Type:   Image     Comment:   External Document

## 2010-02-19 NOTE — Letter (Signed)
[facility]   Imported By: Lanelle Bal 03/19/2009 11:49:39  _____________________________________________________________________  External Attachment:    Type:   Image     Comment:   External Document

## 2010-02-21 NOTE — Progress Notes (Signed)
----   Converted from flag ---- ---- 01/07/2010 1:33 PM, Colon Flattery Tower MD wrote: she does not have any chronic problems to use - please ask her if she has any supplemental insurance so I can use v70.0 for wellness labs  if not will do renal and lipid for lipoid screen and diabetes screen -thanks   ---- 01/07/2010 10:38 AM, Liane Comber CMA (AAMA) wrote: Lab orders please! Good Morning! This pt is scheduled for cpx labs Tuesday, which labs to draw and dx codes to use? Thanks Tasha ------------------------------

## 2010-02-21 NOTE — Assessment & Plan Note (Signed)
Summary: MEDICARE PHYSICAL/CLE   Vital Signs:  Patient profile:   64 year old female Height:      63 inches Weight:      135.25 pounds BMI:     24.05 Temp:     98.3 degrees F oral Pulse rate:   80 / minute Pulse rhythm:   regular BP sitting:   104 / 60  (left arm) Cuff size:   regular  Vitals Entered By: Lewanda Rife LPN (January 23, 2010 10:21 AM) CC: check up of chronic med problems    History of Present Illness: here for check up of chronic med problems and to review health mt list  is feeling fine  no new problems  typical occ aches and pains   omeprazole for heartburn -- used at [employer] when diet was bad better now    gyn care- Debbora Dus at [facility] last visit 9/22 -- had a pap and breast and pelvic exam   wt is up 4 lb with good bmi of 24  hx of breast ca with unilat masectect  still doing mammogram of the L breast   openia - past  dexa-- was in october - normal bones - better than last time ca and d- is good about taking daily  colon screen--has not had a colonosc plans to go ahead and do that in april  will call Dr Ewing Schlein and make her own appt   lipids fair with trig 73 and HDL 40 and LDL 127 diet is getting back to normal after the holidays not enough exercise    Td -- ? when - did it at [facility] in past  flu- does not get  pneumovax does not get  not allergic to eggs   zoster -- does not want this vaccine    Allergies: 1)  Augmentin  Past History:  Past Surgical History: Last updated: 09/23/2007 Dexa - mild osteopenia 2000 Stress myoview - low risk 12/05 Nuclear Stress Test - negative  EF 77% 8/09 breast cancer with mastectomy  Family History: Last updated: 04/02/2006 Father: alive 49 Mother: Alive 39 Siblings: 3 brothers, 1 sister  Family History Hypertension Father's side Family History of Stroke M 1st degree relative <50 - maternal uncle  Risk Factors: Smoking Status: quit (04/02/2006)  Past Medical History: breast cancer  with mastectomy osteopenia past - now nl bmd   Review of Systems General:  Denies fatigue, fever, loss of appetite, and malaise. Eyes:  Denies blurring and eye irritation. CV:  Denies chest pain or discomfort, lightheadness, and near fainting. Resp:  Denies cough, shortness of breath, and wheezing. GI:  Denies abdominal pain, change in bowel habits, indigestion, and nausea. GU:  Denies dysuria and urinary frequency. MS:  Denies muscle aches and cramps. Derm:  Denies itching, lesion(s), poor wound healing, and rash. Neuro:  Denies numbness and tingling. Psych:  Denies anxiety and depression. Endo:  Denies excessive thirst and excessive urination. Heme:  Denies abnormal bruising and bleeding.  Physical Exam  General:  Well-developed,well-nourished,in no acute distress; alert,appropriate and cooperative throughout examination Head:  normocephalic, atraumatic, and no abnormalities observed.   Eyes:  vision grossly intact, pupils equal, pupils round, and pupils reactive to light.  no conjunctival pallor, injection or icterus  Ears:  R ear normal and L ear normal.   Nose:  no nasal discharge.   Neck:  supple with full rom and no masses or thyromegally, no JVD or carotid bruit  Chest Wall:  No deformities, masses, or  tenderness noted. Lungs:  Normal respiratory effort, chest expands symmetrically. Lungs are clear to auscultation, no crackles or wheezes. Heart:  Normal rate and regular rhythm. S1 and S2 normal without gallop, murmur, click, rub or other extra sounds. Abdomen:  Bowel sounds positive,abdomen soft and non-tender without masses, organomegaly or hernias noted. no renal bruits  Msk:  No deformity or scoliosis noted of thoracic or lumbar spine.  no acute joint changes  Pulses:  R and L carotid,radial,femoral,dorsalis pedis and posterior tibial pulses are full and equal bilaterally Extremities:  No clubbing, cyanosis, edema, or deformity noted with normal full range of motion of all  joints.   Neurologic:  sensation intact to light touch, gait normal, and DTRs symmetrical and normal.   Skin:  Intact without suspicious lesions or rashes Cervical Nodes:  No lymphadenopathy noted Inguinal Nodes:  No significant adenopathy Psych:  normal affect, talkative and pleasant    Impression & Recommendations:  Problem # 1:  SCREENING FOR LIPOID DISORDERS (ICD-V77.91) Assessment Comment Only lipids are fair with LDL of 127 disc low sat fat diet and plan for indoor exercise in the winter   Problem # 2:  SCREENING FOR DIABETES MELLITUS (ICD-V77.1) Assessment: Comment Only sugar is good urged to stay slim and avoid sweets   Problem # 3:  Preventive Health Care (ICD-V70.0) Assessment: Comment Only rev imms due  pt declines pneumovax and flu for now  absolutely does not want zoster she will call her ins about Td coverage  is utd gyn care will plan her own colonosc  nl recent dexa- good news   Problem # 4:  NEOPLASM, MALIGNANT, BREAST, HX OF (ICD-V10.3) Assessment: Unchanged this is stable after mastectomy and chemo  has regular f/u with her oncol  dexa shows nl bone density  Problem # 5:  GERD (ICD-530.81) Assessment: Unchanged will continue omeprazole as needed watching bone density Her updated medication list for this problem includes:    Tums 500 Mg Chew (Calcium carbonate antacid) ..... Sometimes    Omeprazole 20 Mg Tbec (Omeprazole) .[name] Kitchen... Take 1 tablet by mouth once a day as needed  Complete Medication List: 1)  Aspirin 81 Mg Tabs (Aspirin) .... One daily 2)  Tylenol Extra Strength 500 Mg Tabs (Acetaminophen) .... Otc as directed 3)  Tums 500 Mg Chew (Calcium carbonate antacid) .... Sometimes 4)  Fish Oil 1200 Mg Caps (Omega-3 fatty acids) .... Take 1 capsule by mouth once a day 5)  Vitamin D3 1000 Unit Tabs (Cholecalciferol) .... Take 1 tablet by mouth once a day 6)  Caltrate 600+d 600-400 Mg-unit Tabs (Calcium carbonate-vitamin d) .... Take 1 tablet by mouth  once a day 7)  Benadryl 25 Mg Tabs (Diphenhydramine hcl) .... Otc as directed. 8)  Omeprazole 20 Mg Tbec (Omeprazole) .... Take 1 tablet by mouth once a day as needed  Patient Instructions: 1)  call Dr Ewing Schlein yourself to schedule colonosc this year  2)  if you are interested in a tetnus shot - call your insurance about coverage  3)  if you want a flu shot - you can get it at a pharmacy 4)  call us back if you want a pneumonia vaccine in the future 5)  no change in medicines   Orders Added: 1)  Est. Patient Level III [40347]    Current Allergies (reviewed today): AUGMENTIN   Preventive Care Screening  Bone Density:    Date:  10/20/2009    Results:  normal std dev  Pap Smear:  Date:  10/11/2009    Results:  normal   Mammogram:    Date:  07/20/2009    Results:  normal left   Contraindications of Treatment or Deferment of Test/Procedure:    Test/Procedure: Zoster vaccine    Reason for deferment: declined

## 2010-04-24 ENCOUNTER — Other Ambulatory Visit: Payer: Self-pay | Admitting: Oncology

## 2010-04-24 ENCOUNTER — Encounter (HOSPITAL_BASED_OUTPATIENT_CLINIC_OR_DEPARTMENT_OTHER): Payer: Medicare Other | Admitting: Oncology

## 2010-04-24 DIAGNOSIS — C50419 Malignant neoplasm of upper-outer quadrant of unspecified female breast: Secondary | ICD-10-CM

## 2010-04-24 LAB — CBC WITH DIFFERENTIAL/PLATELET
BASO%: 0.5 % (ref 0.0–2.0)
EOS%: 2 % (ref 0.0–7.0)
HCT: 42.7 % (ref 34.8–46.6)
MCH: 30 pg (ref 25.1–34.0)
MCHC: 33.7 g/dL (ref 31.5–36.0)
NEUT%: 59.5 % (ref 38.4–76.8)
RBC: 4.8 10*6/uL (ref 3.70–5.45)
RDW: 12.7 % (ref 11.2–14.5)
lymph#: 2.4 10*3/uL (ref 0.9–3.3)

## 2010-04-25 LAB — COMPREHENSIVE METABOLIC PANEL
ALT: 10 U/L (ref 0–35)
AST: 13 U/L (ref 0–37)
Calcium: 10.2 mg/dL (ref 8.4–10.5)
Chloride: 105 mEq/L (ref 96–112)
Creatinine, Ser: 0.96 mg/dL (ref 0.40–1.20)

## 2010-04-26 LAB — CBC
Hemoglobin: 13.7 g/dL (ref 12.0–15.0)
MCHC: 34.1 g/dL (ref 30.0–36.0)
MCV: 90.7 fL (ref 78.0–100.0)
RBC: 4.44 MIL/uL (ref 3.87–5.11)

## 2010-05-02 ENCOUNTER — Encounter (HOSPITAL_BASED_OUTPATIENT_CLINIC_OR_DEPARTMENT_OTHER): Payer: Medicare Other | Admitting: Oncology

## 2010-05-02 ENCOUNTER — Other Ambulatory Visit: Payer: Self-pay | Admitting: Oncology

## 2010-05-02 DIAGNOSIS — C50419 Malignant neoplasm of upper-outer quadrant of unspecified female breast: Secondary | ICD-10-CM

## 2010-05-02 DIAGNOSIS — Z1231 Encounter for screening mammogram for malignant neoplasm of breast: Secondary | ICD-10-CM

## 2010-05-02 DIAGNOSIS — Z9011 Acquired absence of right breast and nipple: Secondary | ICD-10-CM

## 2010-05-14 ENCOUNTER — Encounter: Payer: Self-pay | Admitting: Family Medicine

## 2010-05-15 ENCOUNTER — Ambulatory Visit (INDEPENDENT_AMBULATORY_CARE_PROVIDER_SITE_OTHER): Payer: Medicare Other | Admitting: Family Medicine

## 2010-05-15 ENCOUNTER — Encounter: Payer: Self-pay | Admitting: Family Medicine

## 2010-05-15 VITALS — BP 106/60 | HR 88 | Temp 98.7°F | Wt 135.0 lb

## 2010-05-15 DIAGNOSIS — J069 Acute upper respiratory infection, unspecified: Secondary | ICD-10-CM

## 2010-05-15 DIAGNOSIS — J029 Acute pharyngitis, unspecified: Secondary | ICD-10-CM

## 2010-05-15 MED ORDER — AMOXICILLIN 875 MG PO TABS: 875.0000 mg | ORAL_TABLET | Freq: Two times a day (BID) | ORAL | Status: AC

## 2010-05-15 MED ORDER — FLUTICASONE PROPIONATE 50 MCG/ACT NA SUSP: NASAL | Status: DC

## 2010-05-15 NOTE — Patient Instructions (Signed)
Drink plenty of fluids, take tylenol as needed, and gargle with warm salt water for your throat.  This should gradually improve.  Use the flonase and warm compresses on your face. Take care.  Let us know if you have other concerns.  I would start the amoxil next week if you aren't improved.

## 2010-05-15 NOTE — Progress Notes (Signed)
L ear pain since Monday afternoon. L max sinus pressure.  Congestion.  Today with R ear itching. ST.  No fevers, no vomiting. Dec in appetite.  Dry cough, worse at night with 'tickle' in throat.  No known sick contacts, but had been visiting at [facility] recently.  Used afrin yesterday, she knows to use it for very brief intervals.   Meds, vitals, and allergies reviewed.  I specifically asked her about the amoxil.  She has GI upset from the clavulanic acid, not a true allergy with amoxil itself.   ROS: See HPI.  Otherwise, noncontributory.  GEN: nad, alert and oriented HEENT: mucous membranes moist, tm w/o erythema, nasal exam w/o erythema but L>R side is stuffy, clear discharge noted,  OP with cobblestoning, L max sinus is slightly ttp NECK: supple w/o LA CV: rrr.   PULM: ctab, no inc wob EXT: no edema SKIN: no acute rash  RST neg.

## 2010-05-15 NOTE — Assessment & Plan Note (Signed)
Hold abx for now.  Use nasal saline.  Okay to use afrin sparingly.  Start flonase and use warm compresses on face.  Fu prn.

## 2010-06-04 NOTE — Op Note (Signed)
NAMEBYRD, TERRERO NO.:  [id number]   MEDICAL RECORD NO.:  [id number]          PATIENT TYPE:  AMB   LOCATION:  [facility]                          FACILITY:  [facility]   PHYSICIAN:  Thomas A. Cornett, M.D.DATE OF BIRTH:  11/02/1945   DATE OF PROCEDURE:  09/16/2007  DATE OF DISCHARGE:                               OPERATIVE REPORT   PREOPERATIVE DIAGNOSIS:  Multifocal right breast cancer.   POSTOPERATIVE DIAGNOSIS:  Multifocal right breast cancer.   PROCEDURES:  1. Right simple mastectomy.  2. Right axillary sentinel lymph node mapping with injection of      methylene blue dye.  3. Placement of 8-French left subclavian Port-A-Cath with fluoroscopy.   SURGEON:  Maisie Fus A. Cornett, MD   ASSISTANT:  Almond Lint, MD   ANESTHESIA:  LMA anesthesia with 0.25% Sensorcaine with epinephrine for  Port-A-Cath.   ESTIMATED BLOOD LOSS:  30 mL.   SPECIMENS:  1. Right breast.  2. Three right axillary sentinel lymph nodes, negative by touch prep.   DRAINS:  #19 Blake drains to right mastectomy incision.   INDICATIONS FOR PROCEDURE:  The patient is a 65 year old female found to  have multifocal right breast cancer.  She will need a right mastectomy  for this as well as chemotherapy, and presents today for a right simple  mastectomy with sentinel lymph node mapping and placement of a Port-A-  Cath for postop chemotherapy after seeing the oncologist.  Risk of  bleeding, infection, cardiac tamponade, pneumothorax, injury to other  structures in the mediastinum or neck were discussed with the patient  prior to Port-A-Cath placement.  I also talked about migration as well  as the catheter malfunctioning or getting infected or clotted.  Risk of  the mastectomy, bleeding, infection, wound healing issues, skin  necrosis, numbness, pain, stiffness, and weakness of the right shoulder.  She understood the above and agreed to proceed.   DESCRIPTION OF PROCEDURE:  The patient was brought  to the operating room  and placed supine.  Right arm was extended and left arm was tucked.  Methylene blue dye 4 mL were injected into the right nipple in a  subareolar position after induction of LMA anesthesia and massaged for 5  minutes sterilely.  We then prepped and draped the entire upper chest.  The left subclavian Port-A-Cath was placed first.  The patient was  placed in Trendelenburg.  We then were able to cannulate the left  subclavian vein with the needle and pass a wire through this.  The wire  was then visualized to go down the left subclavian vein across  innominate vein into the superior vena cava without difficulty.  After  we assured adequate wire placed with the fluoroscopy, I removed the  needle.  We then made a small incision below the needle entrance site  after flattening the patient out to create a pocket for a Port-A-Cath  down to the chest wall.  An 8-French PowerPort was used and the catheter  was attached to the actual port hub.  This was flushed out and secured.  We then tunneled the port  from the lower incision to the upper incision  where the wire exited.  We then cut the catheter to about 21 cm at the  skin edge.  I then placed the port down into the actual pocket created  for and put the patient back in Trendelenburg.  We then fed a dilator  over the wire, moving the dilator to-and-fro, as we advanced the  dilator, we were meeting no resistance.  We then placed a dilator  introducer complex together.  We put this over the wire and again  advanced this to-and-fro without resistance.  Once this entire complex  was in, I removed the wire and dilator and passed this off the field.  The catheter was then put down the peel-away sheath and the peel-away  sheath was peeled away without difficulty.  Fluoroscopy then showed the  tip of the catheter to be actually in the right atrium, so I pulled this  back.  After I did this, it appeared to be at the junction of the  SVC  and right atrium.  There was a small amount of redundant catheter within  the port wound itself, which I flattened out.  We then secured the Port-  A-Cath to the chest wall with 2-0 Prolene.  The Port-A-Cath drew back  very easily and flushed quite easily.  Dark nonpulsatile blood was  noted.  I did not see any signs of the pneumothorax, of note, on the  fluoroscopic views.  The tip appeared to be the junction of the superior  vena cava and the right atrium.  We then closed these wounds after  placing 5 mL of 100 mL per unit of heparin into the port itself.  Wound  was closed with interrupted 3-0 Vicryl and 4-0 Monocryl subcuticular  stitches.  Dermabond was applied.   Next, the right mastectomy was done.  Superior and inferior skin  incisions around the nipple-areolar complex were used to create a  superior and inferior flap.  This was taken all the way up to the  clavicle and down to the inframammary fold.  Once these flaps were  created, we then used a NeoProbe and did our axillary lymph node mapping  procedure.  Of note, she was injected in the holding area with  technetium sulfur colloid.  We were able to identify 3 blue hot sentinel  nodes, and these were sent to pathology, and were negative by touch  prep.  These were level 1 axillary nodes.  There was no other background  activity at this point of any significance upon reinspection of the  right axillary bed.  We then after doing this, did the inferior flap and  took this all the way down to the inframammary fold.  The breast was  then excised off the chest wall in a medial to lateral fashion and  passed off the field.  Hemostasis was achieved.  Irrigation was used and  suction down and it was clear.  Through 2 separate stab wounds, 19 Blake  drains were placed and secured to the skin with 2-0 nylon.  We then  closed the mastectomy wound with a deep layer of 3-0 Vicryl to secure  the skin down to the chest wall and then used  3-0 Monocryl in a  subcuticular fashion to close the skin edges.  All final counts of  sponge, needle, and instruments were found to be correct at this portion  of the case.  Dermabond was applied.  The patient was  then awoken and  taken to recovery in satisfactory condition.      Thomas A. Cornett, M.D.  Electronically Signed     TAC/MEDQ  D:  09/16/2007  T:  09/17/2007  Job:  [id number]   cc:   Marne A. Milinda Antis, MD  Pierce Crane, MD

## 2010-08-13 ENCOUNTER — Ambulatory Visit
Admission: RE | Admit: 2010-08-13 | Discharge: 2010-08-13 | Disposition: A | Discharge status: Home / Self Care | Payer: Medicare Other | Source: Ambulatory Visit | Attending: Oncology | Admitting: Oncology

## 2010-08-13 DIAGNOSIS — Z9011 Acquired absence of right breast and nipple: Secondary | ICD-10-CM

## 2010-08-13 DIAGNOSIS — Z1231 Encounter for screening mammogram for malignant neoplasm of breast: Secondary | ICD-10-CM

## 2010-10-24 ENCOUNTER — Other Ambulatory Visit: Payer: Self-pay | Admitting: Oncology

## 2010-10-24 ENCOUNTER — Encounter (HOSPITAL_BASED_OUTPATIENT_CLINIC_OR_DEPARTMENT_OTHER): Payer: Medicare Other | Admitting: Oncology

## 2010-10-24 DIAGNOSIS — Z5111 Encounter for antineoplastic chemotherapy: Secondary | ICD-10-CM

## 2010-10-24 DIAGNOSIS — C50419 Malignant neoplasm of upper-outer quadrant of unspecified female breast: Secondary | ICD-10-CM

## 2010-10-24 LAB — CBC WITH DIFFERENTIAL/PLATELET
BASO%: 0.6 % (ref 0.0–2.0)
HCT: 39.4 % (ref 34.8–46.6)
LYMPH%: 22.8 % (ref 14.0–49.7)
MCHC: 35 g/dL (ref 31.5–36.0)
MCV: 88.3 fL (ref 79.5–101.0)
MONO%: 7.3 % (ref 0.0–14.0)
NEUT%: 68 % (ref 38.4–76.8)
Platelets: 240 10*3/uL (ref 145–400)
RBC: 4.46 10*6/uL (ref 3.70–5.45)

## 2010-10-25 LAB — COMPREHENSIVE METABOLIC PANEL
Albumin: 4.1 g/dL (ref 3.5–5.2)
BUN: 13 mg/dL (ref 6–23)
Calcium: 9 mg/dL (ref 8.4–10.5)
Chloride: 106 mEq/L (ref 96–112)
Creatinine, Ser: 0.87 mg/dL (ref 0.50–1.10)
Glucose, Bld: 139 mg/dL — ABNORMAL HIGH (ref 70–99)
Potassium: 4 mEq/L (ref 3.5–5.3)

## 2010-10-25 LAB — VITAMIN D 25 HYDROXY (VIT D DEFICIENCY, FRACTURES): Vit D, 25-Hydroxy: 38 ng/mL (ref 30–89)

## 2010-10-31 ENCOUNTER — Encounter (HOSPITAL_BASED_OUTPATIENT_CLINIC_OR_DEPARTMENT_OTHER): Payer: Medicare Other | Admitting: Oncology

## 2010-10-31 DIAGNOSIS — Z9010 Acquired absence of unspecified breast and nipple: Secondary | ICD-10-CM

## 2010-10-31 DIAGNOSIS — Z853 Personal history of malignant neoplasm of breast: Secondary | ICD-10-CM

## 2010-12-18 NOTE — Progress Notes (Signed)
CC:   Gina Williams, M.D.  PROBLEM:  ER PR negative, HER-2 positive breast cancer status post mastectomy and node dissection followed by 4 cycles of Taxol with Herceptin, Herceptin given for a year, last treated 10/04/2010 with regular followup.  INTERVAL HISTORY:  Gina Williams is here for followup.  She is doing pretty well.  She has no intercurrent complaints.  Appetite is good. Weight is stable.  Her last mammogram was in July of the left breast. This was unremarkable.  Last bone density test done a year ago showed mild osteopenia T score -2.2 in the spine, -1.9 in the hip.  MEDICATIONS:  Medication list was reviewed.  She does take some vitamin E and multivitamins.  PHYSICAL EXAMINATION:  Pleasant, alert woman looking stated age.  Vital signs:  Blood pressure 113/75, temperature 98, pulse 78, respiratory rate 20, weight 136.  No palpable adenopathy in the head or neck area. Oropharynx normal.  Lungs:  Clear.  Cardiovascular:  Heart sounds are normal.  She is status post mastectomy.  Contralateral breast is normal. No masses, no nipple retraction or skin changes.  Both axilla negative. There is no palpable hepatosplenomegaly.  No inguinal adenopathy.  No peripheral cyanosis, clubbing or edema.  LABORATORY DATA:  From 10/4 are within normal limits.  Vitamin D level 38.  Tumor marker normal.  IMPRESSION/PLAN:  Gina Williams is doing well with no clinical evidence of recurrence.  I will see her again in followup in 6 months time.  Of note is that the last mammogram performed of the left breast in July of this year was normal.    ______________________________ Pierce Crane, M.D., F.R.C.P.C. PR/MEDQ  D:  12/18/2010  T:  12/18/2010  Job:  [id number]

## 2010-12-23 ENCOUNTER — Telehealth: Payer: Self-pay | Admitting: *Deleted

## 2010-12-23 NOTE — Telephone Encounter (Signed)
patient confirmed over the phone the new date and time on 03-25-2011 starting at 8:30am

## 2010-12-26 ENCOUNTER — Encounter: Payer: Self-pay | Admitting: Family Medicine

## 2010-12-26 ENCOUNTER — Ambulatory Visit (INDEPENDENT_AMBULATORY_CARE_PROVIDER_SITE_OTHER): Payer: Medicare Other | Admitting: Family Medicine

## 2010-12-26 VITALS — BP 126/80 | HR 84 | Temp 98.0°F | Wt 139.0 lb

## 2010-12-26 DIAGNOSIS — R21 Rash and other nonspecific skin eruption: Secondary | ICD-10-CM

## 2010-12-26 NOTE — Assessment & Plan Note (Signed)
?  allergic rxn to soap, rec stop. ?bug bites. Has already washed all bedding. Not consistent with shingles. Will treat with OTC anti-itch and sarna cream. Update [place] if sxs not improving as expected. Did not use oral steroids given recent cataract surgery, planned future cataract surgery.

## 2010-12-26 NOTE — Patient Instructions (Signed)
I'm not sure where this rash is coming from.  Could be allergic reaction to something or less likely bug bites. It's not shingles. May continue anti itch cream as well as try sarna cream. Let us know if not improving Good to see you today, call us with questions.

## 2010-12-26 NOTE — Progress Notes (Signed)
  Subjective:    Patient ID: Gina Williams, female    DOB: 13-Apr-1945, 65 y.o.   MRN: [id number]  HPI CC: rash  3d h/o rash on lower back that itches.  Also spot on upper neck that itches as well.  Does not cross midline.  Significant pruritis.  Red bumps.   No tingling/burning prior to rash.  No pain.  Not vesicular.  No fevers/chills, abd pain, n/v, oral lesions.  No new lotions, detergents, soaps, shampoos, meds, foods.  No h/o shingles in past.  Has used new soap for last 1 month. Her chihuahua may have gotten into sumac or other outdoor allergen, and does cuddle with her.  Review of Systems Per HPI    Objective:   Physical Exam  Nursing note and vitals reviewed. Constitutional: She appears well-developed and well-nourished. No distress.  HENT:  Mouth/Throat: Oropharynx is clear and moist. No oropharyngeal exudate.  Eyes: Conjunctivae and EOM are normal. Pupils are equal, round, and reactive to light. No scleral icterus.  Skin: Skin is warm and dry. Rash noted.          Faint papular pruritic rash throughout abd and back, some on neck, some with central punctum, some excoriations. Spares arms, legs, soles, palms, and face nonvesicular       Assessment & Plan:

## 2011-01-10 ENCOUNTER — Encounter: Payer: Self-pay | Admitting: *Deleted

## 2011-01-10 NOTE — Progress Notes (Unsigned)
Pt reports that  she rec'd a bill for services 04/24/2010 that should have been covered by her insurance. Pt states that there has been a mistake in diagnosis coding. Message left  for  [employer] in revenue cycle for assistance

## 2011-02-05 ENCOUNTER — Other Ambulatory Visit: Payer: Self-pay | Admitting: Internal Medicine

## 2011-02-05 MED ORDER — ESOMEPRAZOLE MAGNESIUM 40 MG PO CPDR: 40.0000 mg | DELAYED_RELEASE_CAPSULE | Freq: Every day | ORAL | Status: DC

## 2011-02-05 NOTE — Telephone Encounter (Signed)
She was seen on 12.6.12 by Dr. Reece Agar but her last appointment with you on 01/08/10.  They are requesting Nexium 40mg  1x daily but in her chart it's Omeprazole 20mg  1 daily PRN.  Please advise.

## 2011-02-05 NOTE — Telephone Encounter (Signed)
Spoke with pt. She has not taken the Nexium since 2011 but the Omeprazole is not working now and pt wants to try taking Nexium again. Pt said she would ck with [facility] on Thur for refill so no need to call her back.  I added Nexium to med list incase Dr Milinda Antis will prescribe.

## 2011-02-05 NOTE — Telephone Encounter (Signed)
Will change to Nexium  Will refill electronically Schedule f/u this winter please

## 2011-02-05 NOTE — Telephone Encounter (Signed)
Please call pt to clarify this

## 2011-03-03 DIAGNOSIS — M65839 Other synovitis and tenosynovitis, unspecified forearm: Secondary | ICD-10-CM | POA: Diagnosis not present

## 2011-03-03 DIAGNOSIS — M65849 Other synovitis and tenosynovitis, unspecified hand: Secondary | ICD-10-CM | POA: Diagnosis not present

## 2011-03-03 DIAGNOSIS — M7130 Other bursal cyst, unspecified site: Secondary | ICD-10-CM | POA: Diagnosis not present

## 2011-03-03 DIAGNOSIS — M659 Synovitis and tenosynovitis, unspecified: Secondary | ICD-10-CM | POA: Diagnosis not present

## 2011-03-25 ENCOUNTER — Other Ambulatory Visit: Payer: Medicare Other | Admitting: Lab

## 2011-03-25 ENCOUNTER — Ambulatory Visit: Payer: Medicare Other | Admitting: Oncology

## 2011-04-29 DIAGNOSIS — M659 Synovitis and tenosynovitis, unspecified: Secondary | ICD-10-CM | POA: Diagnosis not present

## 2011-05-07 ENCOUNTER — Ambulatory Visit (HOSPITAL_BASED_OUTPATIENT_CLINIC_OR_DEPARTMENT_OTHER): Payer: Medicare Other | Admitting: Oncology

## 2011-05-07 ENCOUNTER — Other Ambulatory Visit (HOSPITAL_BASED_OUTPATIENT_CLINIC_OR_DEPARTMENT_OTHER): Payer: Medicare Other | Admitting: Lab

## 2011-05-07 ENCOUNTER — Telehealth: Payer: Self-pay | Admitting: *Deleted

## 2011-05-07 VITALS — BP 112/68 | HR 76 | Temp 97.8°F | Ht 63.0 in | Wt 136.2 lb

## 2011-05-07 DIAGNOSIS — Z853 Personal history of malignant neoplasm of breast: Secondary | ICD-10-CM

## 2011-05-07 DIAGNOSIS — Z171 Estrogen receptor negative status [ER-]: Secondary | ICD-10-CM

## 2011-05-07 DIAGNOSIS — C50419 Malignant neoplasm of upper-outer quadrant of unspecified female breast: Secondary | ICD-10-CM

## 2011-05-07 DIAGNOSIS — Z170 Estrogen receptor positive status [ER+]: Secondary | ICD-10-CM

## 2011-05-07 DIAGNOSIS — Z5112 Encounter for antineoplastic immunotherapy: Secondary | ICD-10-CM

## 2011-05-07 DIAGNOSIS — E559 Vitamin D deficiency, unspecified: Secondary | ICD-10-CM

## 2011-05-07 LAB — CBC WITH DIFFERENTIAL/PLATELET
BASO%: 1.1 % (ref 0.0–2.0)
HCT: 41.5 % (ref 34.8–46.6)
HGB: 13.8 g/dL (ref 11.6–15.9)
MCHC: 33.2 g/dL (ref 31.5–36.0)
MONO#: 0.6 10*3/uL (ref 0.1–0.9)
NEUT%: 67 % (ref 38.4–76.8)
WBC: 8.3 10*3/uL (ref 3.9–10.3)
lymph#: 1.9 10*3/uL (ref 0.9–3.3)

## 2011-05-07 NOTE — Telephone Encounter (Signed)
made patient appointment for mammogram in 07-2011 printed out calendar and gave to the patient

## 2011-05-08 LAB — COMPREHENSIVE METABOLIC PANEL
ALT: 14 U/L (ref 0–35)
AST: 16 U/L (ref 0–37)
Albumin: 4.1 g/dL (ref 3.5–5.2)
BUN: 16 mg/dL (ref 6–23)
Calcium: 9.1 mg/dL (ref 8.4–10.5)
Chloride: 108 mEq/L (ref 96–112)
Potassium: 4.2 mEq/L (ref 3.5–5.3)
Total Protein: 6.5 g/dL (ref 6.0–8.3)

## 2011-05-08 NOTE — Progress Notes (Signed)
Hematology and Oncology Follow Up Visit  MYLDRED RAJU [id number] [birth year] 66 y.o. 05/08/2011 7:12 AM PCP  Principle Diagnosis: 66 year old woman with history of T1 A. N0 ER/PR negative HER-2 positive breast cancer, status post mastectomy 09/16/2007, status post 4 cycles of Taxol Herceptin, Herceptin completed for a year in 10/04/2010.  Interim History:  There have been no intercurrent illness, hospitalizations or medication changes. She feels well no current problems  Medications: I have reviewed the patient's current medications.  Allergies:  Allergies  Allergen Reactions  . WUJ:WJXBJYNWGNF+AOZHYQMVH+QIONGEXBMW Acid+Aspartame     REACTION: nausea (tolerates amoxil)  . Omeprazole     ineffective     Past Medical History, Surgical history, Social history, and Family History were reviewed and updated.  Review of Systems: Constitutional:  Negative for fever, chills, night sweats, anorexia, weight loss, pain. Cardiovascular: no chest pain or dyspnea on exertion Respiratory: negative Neurological: negative Dermatological: negative ENT: negative Skin Gastrointestinal: negative Genito-Urinary: negative Hematological and Lymphatic: negative Breast: negative Musculoskeletal: negative Remaining ROS negative.  Physical Exam: Blood pressure 112/68, pulse 76, temperature 97.8 F (36.6 C), height 5\' 3"  (1.6 m), weight 136 lb 3.2 oz (61.78 kg). ECOG:  General appearance: alert, cooperative and appears stated age Head: Normocephalic, without obvious abnormality, atraumatic Neck: no adenopathy, no carotid bruit, no JVD, supple, symmetrical, trachea midline and thyroid not enlarged, symmetric, no tenderness/mass/nodules Lymph nodes: Cervical, supraclavicular, and axillary nodes normal. Cardiac : regular rate and rhythm, no murmurs or gallops Pulmonary:clear to auscultation bilaterally and normal percussion bilaterally Breasts: inspection negative, no nipple discharge or bleeding,  no masses or nodularity palpable Abdomen:soft, non-tender; bowel sounds normal; no masses,  no organomegaly Extremities negative Neuro: alert, oriented, normal speech, no focal findings or movement disorder noted  Lab Results: Lab Results  Component Value Date   WBC 8.3 05/07/2011   HGB 13.8 05/07/2011   HCT 41.5 05/07/2011   MCV 88.8 05/07/2011   PLT 266 05/07/2011     Chemistry      Component Value Date/Time   NA 140 05/07/2011 1332   K 4.2 05/07/2011 1332   CL 108 05/07/2011 1332   CO2 24 05/07/2011 1332   BUN 16 05/07/2011 1332   CREATININE 0.84 05/07/2011 1332      Component Value Date/Time   CALCIUM 9.1 05/07/2011 1332   ALKPHOS 69 05/07/2011 1332   AST 16 05/07/2011 1332   ALT 14 05/07/2011 1332   BILITOT 0.4 05/07/2011 1332      .pathology. Radiological Studies: chest X-ray n/a Mammogram Due 7/13 Bone density Due 10/13  Impression and Plan: Iwalani is doing well. She is here with her aunt. It is apparent that despite her very strong family history genetic testing has not been done despite my encouragement oh over the past few years. I scheduled her and to go for genetic testing at least to get some input regarding the indications to do so. I did explain the risk of subsequent hearing cancer. Neither she nor her aunt have any children.  I will see Delma back in 6 months.  More than 50% of the visit was spent in patient-related counselling   Pierce Crane, MD 4/18/20137:12 AM

## 2011-05-14 ENCOUNTER — Telehealth: Payer: Self-pay | Admitting: *Deleted

## 2011-05-14 NOTE — Telephone Encounter (Signed)
Pt. Called re: labs from 05/07/11.  Discussed with Dr. Donnie Coffin.   Her level is low and she should increase her Vitamin D from 1000 units to 2000units. Called pt.  Let her know level is 31  Normal is 30-89.   She is currently not taking any Vitamin D.  She should go again and start the 2000 units daily.

## 2011-08-14 ENCOUNTER — Ambulatory Visit: Payer: Medicare Other

## 2011-08-14 ENCOUNTER — Ambulatory Visit
Admission: RE | Admit: 2011-08-14 | Discharge: 2011-08-14 | Disposition: A | Discharge status: Home / Self Care | Payer: Medicare Other | Source: Ambulatory Visit | Attending: Oncology | Admitting: Oncology

## 2011-08-14 DIAGNOSIS — Z853 Personal history of malignant neoplasm of breast: Secondary | ICD-10-CM

## 2011-08-14 DIAGNOSIS — Z1231 Encounter for screening mammogram for malignant neoplasm of breast: Secondary | ICD-10-CM

## 2011-08-14 DIAGNOSIS — E559 Vitamin D deficiency, unspecified: Secondary | ICD-10-CM

## 2011-08-21 DIAGNOSIS — D235 Other benign neoplasm of skin of trunk: Secondary | ICD-10-CM | POA: Diagnosis not present

## 2011-08-21 DIAGNOSIS — L820 Inflamed seborrheic keratosis: Secondary | ICD-10-CM | POA: Diagnosis not present

## 2011-09-08 ENCOUNTER — Other Ambulatory Visit: Payer: Self-pay | Admitting: *Deleted

## 2011-09-08 MED ORDER — FLUTICASONE PROPIONATE 50 MCG/ACT NA SUSP: NASAL | Status: DC

## 2011-09-08 NOTE — Telephone Encounter (Signed)
Will refill electronically  

## 2011-09-08 NOTE — Telephone Encounter (Signed)
Patient not seen for cpx just acute in past year okay to refill?

## 2011-12-11 ENCOUNTER — Ambulatory Visit (HOSPITAL_BASED_OUTPATIENT_CLINIC_OR_DEPARTMENT_OTHER): Payer: Medicare Other | Admitting: Oncology

## 2011-12-11 ENCOUNTER — Telehealth: Payer: Self-pay | Admitting: *Deleted

## 2011-12-11 VITALS — BP 119/76 | HR 78 | Temp 98.3°F | Resp 20 | Ht 63.0 in | Wt 138.9 lb

## 2011-12-11 DIAGNOSIS — C50419 Malignant neoplasm of upper-outer quadrant of unspecified female breast: Secondary | ICD-10-CM | POA: Diagnosis not present

## 2011-12-11 DIAGNOSIS — Z853 Personal history of malignant neoplasm of breast: Secondary | ICD-10-CM

## 2011-12-11 DIAGNOSIS — M899 Disorder of bone, unspecified: Secondary | ICD-10-CM | POA: Diagnosis not present

## 2011-12-11 DIAGNOSIS — M8580 Other specified disorders of bone density and structure, unspecified site: Secondary | ICD-10-CM

## 2011-12-11 NOTE — Progress Notes (Signed)
Hematology and Oncology Follow Up Visit  Gina Williams [id number] 1945/06/18 66 y.o. 12/11/2011 11:15 AM PCP  Principle Diagnosis: 66 year old woman with history of T1 A. N0 ER/PR negative HER-2 positive breast cancer, status post mastectomy 09/16/2007, status post 4 cycles of Taxol Herceptin, Herceptin completed for a year in 10/04/2010.  Interim History:  There have been no intercurrent illness, hospitalizations or medication changes. She feels well no current problems, her brother was hospitalized with a small stroke and so she was looking after him and his he is now in a nursing home. His brother as well another brother both mentally retarded and she looks after both. She also looks after her sister who is here today who also has a history of breast cancer and multiple other siblings. She is off her medications. She is taking vitamin D.  Medications: I have reviewed the patient's current medications.  Allergies:  Allergies  Allergen Reactions  . Amoxicillin-Pot Clavulanate     REACTION: nausea (tolerates amoxil)  . Omeprazole     ineffective     Past Medical History, Surgical history, Social history, and Family History were reviewed and updated.  Review of Systems: Constitutional:  Negative for fever, chills, night sweats, anorexia, weight loss, pain. Cardiovascular: no chest pain or dyspnea on exertion Respiratory: negative Neurological: negative Dermatological: negative ENT: negative Skin Gastrointestinal: negative Genito-Urinary: negative Hematological and Lymphatic: negative Breast: negative Musculoskeletal: negative Remaining ROS negative.  Physical Exam: Blood pressure 119/76, pulse 78, temperature 98.3 F (36.8 C), resp. rate 20, height 5\' 3"  (1.6 m), weight 138 lb 14.4 oz (63.005 kg). ECOG:  General appearance: alert, cooperative and appears stated age Head: Normocephalic, without obvious abnormality, atraumatic Neck: no adenopathy, no carotid bruit, no  JVD, supple, symmetrical, trachea midline and thyroid not enlarged, symmetric, no tenderness/mass/nodules Lymph nodes: Cervical, supraclavicular, and axillary nodes normal. Cardiac : regular rate and rhythm, no murmurs or gallops Pulmonary:clear to auscultation bilaterally and normal percussion bilaterally Breasts: inspection negative, no nipple discharge or bleeding, no masses or nodularity palpable, status post right mastectomy, chest wall is normal. Left breast normal both axilla negative. Abdomen:soft, non-tender; bowel sounds normal; no masses,  no organomegaly Extremities negative Neuro: alert, oriented, normal speech, no focal findings or movement disorder noted  Lab Results: Lab Results  Component Value Date   WBC 8.3 05/07/2011   HGB 13.8 05/07/2011   HCT 41.5 05/07/2011   MCV 88.8 05/07/2011   PLT 266 05/07/2011     Chemistry      Component Value Date/Time   NA 140 05/07/2011 1332   K 4.2 05/07/2011 1332   CL 108 05/07/2011 1332   CO2 24 05/07/2011 1332   BUN 16 05/07/2011 1332   CREATININE 0.84 05/07/2011 1332      Component Value Date/Time   CALCIUM 9.1 05/07/2011 1332   ALKPHOS 69 05/07/2011 1332   AST 16 05/07/2011 1332   ALT 14 05/07/2011 1332   BILITOT 0.4 05/07/2011 1332      .pathology. Radiological Studies: chest X-ray n/a Mammogram Due 7/13-within normal limits Bone density Due 10/13  Impression and Plan: Gina Williams is doing well. No evidence of recurrence. All see her in 6 months. We'll schedule her bone density test in October. I will schedule labs when she returns. She and her aunt who were with her today they are not inclined to pursue genetic testing as per previous discussions..  I will see Gina Williams back in 6 months.  More than 50% of the visit was spent in  patient-related counselling   Pierce Crane, MD 11/21/201311:15 AM

## 2011-12-11 NOTE — Telephone Encounter (Signed)
Gave patient appointment for 06-10-2012

## 2011-12-12 ENCOUNTER — Other Ambulatory Visit: Payer: Medicare Other | Admitting: Lab

## 2011-12-30 ENCOUNTER — Ambulatory Visit
Admission: RE | Admit: 2011-12-30 | Discharge: 2011-12-30 | Disposition: A | Discharge status: Home / Self Care | Payer: Medicare Other | Source: Ambulatory Visit | Attending: Oncology | Admitting: Oncology

## 2011-12-30 DIAGNOSIS — M8580 Other specified disorders of bone density and structure, unspecified site: Secondary | ICD-10-CM

## 2011-12-30 DIAGNOSIS — M899 Disorder of bone, unspecified: Secondary | ICD-10-CM | POA: Diagnosis not present

## 2011-12-30 DIAGNOSIS — M949 Disorder of cartilage, unspecified: Secondary | ICD-10-CM | POA: Diagnosis not present

## 2011-12-30 DIAGNOSIS — Z853 Personal history of malignant neoplasm of breast: Secondary | ICD-10-CM

## 2012-02-16 ENCOUNTER — Telehealth: Payer: Self-pay | Admitting: *Deleted

## 2012-02-16 ENCOUNTER — Encounter: Payer: Self-pay | Admitting: Oncology

## 2012-02-16 NOTE — Telephone Encounter (Signed)
Pt called about her appt.  Confirmed 06/10/12 appt w/ pt.  Mailed letter & calendar to pt.

## 2012-03-11 DIAGNOSIS — H52209 Unspecified astigmatism, unspecified eye: Secondary | ICD-10-CM | POA: Diagnosis not present

## 2012-03-11 DIAGNOSIS — H01009 Unspecified blepharitis unspecified eye, unspecified eyelid: Secondary | ICD-10-CM | POA: Diagnosis not present

## 2012-03-11 DIAGNOSIS — Z961 Presence of intraocular lens: Secondary | ICD-10-CM | POA: Diagnosis not present

## 2012-03-11 DIAGNOSIS — H43819 Vitreous degeneration, unspecified eye: Secondary | ICD-10-CM | POA: Diagnosis not present

## 2012-03-30 ENCOUNTER — Other Ambulatory Visit: Payer: Self-pay | Admitting: Family Medicine

## 2012-03-30 NOTE — Telephone Encounter (Signed)
Ok to refill? No recent appt and no future appt 

## 2012-03-30 NOTE — Telephone Encounter (Signed)
Please give her 6 months of refils 

## 2012-06-10 ENCOUNTER — Ambulatory Visit: Payer: Medicare Other | Admitting: Oncology

## 2012-06-10 ENCOUNTER — Ambulatory Visit (HOSPITAL_BASED_OUTPATIENT_CLINIC_OR_DEPARTMENT_OTHER): Payer: Medicare Other | Admitting: Family

## 2012-06-10 ENCOUNTER — Telehealth: Payer: Self-pay | Admitting: *Deleted

## 2012-06-10 ENCOUNTER — Encounter: Payer: Self-pay | Admitting: Family

## 2012-06-10 ENCOUNTER — Other Ambulatory Visit: Payer: Medicare Other | Admitting: Lab

## 2012-06-10 VITALS — BP 112/73 | HR 71 | Temp 98.1°F | Resp 20 | Ht 63.0 in | Wt 134.0 lb

## 2012-06-10 DIAGNOSIS — Z853 Personal history of malignant neoplasm of breast: Secondary | ICD-10-CM

## 2012-06-10 NOTE — Patient Instructions (Addendum)
Please contact us at [phone] if you have any questions or concerns.  Please continue to do well and enjoy life!!!  Get plenty of rest, drink plenty of water, exercise daily (walking), eat a balanced diet.  Continue to take calcium daily in addition to vitamin D3 daily.   Complete monthly self-breast examinations.  Have a clinical breast exam by a physician every year  Have your mammogram completed every year.

## 2012-06-10 NOTE — Progress Notes (Signed)
[facility]  [phone] [phone]  OFFICE PROGRESS NOTE   ID: Gina Williams   DOB: February 02, 1945  MR#: [id number]  GNF#:[id number]   PCP: Gina Manns, MD GYN: Gina Better, MD SU: Gina Bouillon, MD OTHER MD:   HISTORY OF PRESENT ILLNESS: From Dr. Theron Arista Williams's 2 patient evaluation note dated 09/01/2007: "This is a delightful 67 year old woman with a strong family history of breast cancer.  She has not had a recent mammogram since about 2001.  She, after some duress when her aunt was seen in our office for evaluation of breast cancer, she ultimately had a mammogram on 08/03/07.  This showed calcifications in the right breast.  Additional views were recommended.  Right diagnostic mammogram showed an area of calcification measuring 2 cm in length in the upper outer quadrant.  There was a smaller cluster of calcifications in the lower outer quadrant in the right breast 5.8 cm from the first cluster.  These calcifications were suspicious but varied in shape and size.  Biopsies of both these areas were performed on 08/12/07 and the right breast lateral superior area showed invasive mammary carcinoma.  This was ER and PR negative.  Proliferative index 78%, HER-2 was 3+.  The other area was DCIS, which was strongly ER and PR positive at 97% and 98% respectively.  She was seen by Dr. Luisa Williams and a discussion of mastectomy occurred because of the distance between these two areas.  An MRI scan has been performed on 08/19/07.  This showed a larger area in the upper outer posterior portion of the right breast measuring 2.5 x 1.2 x 2.6 cm.  In the lower outer quadrant, there is a smaller area measuring 1.4 x 1.5 x 1.5 cm.  No other abnormalities were seen."  Her subsequent history is as detailed below.  INTERVAL HISTORY: Dr. Darnelle Williams and I saw Gina Williams today for followup of invasive ductal carcinoma and DCIS of the right breast .  The patient was last seen by Dr.  Donnie Williams on 12/11/2011 .  Since her last office visit, the patient has been doing relatively well.  She is establishing herself with Dr. Darrall Williams service today.  REVIEW OF SYSTEMS: A 10 point review of systems was completed and is negative except occasional constipation.  The patient denies any other symptomatology.   PAST MEDICAL HISTORY: Past Medical History  Diagnosis Date  . Osteopenia     In the past, now nl. BMD  . Breast cancer 09/16/2007    Mastectomy  . Vertigo 2007  . GERD (gastroesophageal reflux disease)     PAST SURGICAL HISTORY: Past Surgical History  Procedure Laterality Date  . Mastectomy  8/09    Breast Cancer  . Stress myoview  12/05    Low risk  . Nuclear stress test      Negative  EF 77%  . Hand surgery Left 10/2005    Nerve damage  . Ganglion cyst excision Left 1970s    Multiple surgeries  . Tonsillectomy  Late 1970s  . Hand surgery Left 2011    Torn ligament surgery  . Shoulder surgery Bilateral Late 1980s    Frozen shoulders bilaterally  Includes torn ligament, left hand surgery 10/2005, tonsillectomy in late 70s, ganglion left wrist early 70s, and frozen shoulder in late 80s.   FAMILY HISTORY Family History  Problem Relation Age of Onset  . Asthma Mother   . Aortic aneurysm Mother   . Stroke Maternal Uncle   .  Hypertension Other     HTN on Father's side of family  . Stroke Father   . Alzheimer's disease Father   . Cancer Paternal Aunt     Breast Cancer  . Cancer Paternal Aunt     Breast Cancer  . Cancer Paternal Aunt     Breast Cancer  She has three paternal aunts who have had breast cancer, has one cousin with breast cancer.     GYNECOLOGIC HISTORY: Menarche age 5, menopause 61.  Prempro for eight years, discontinued a number of years ago.  No recent hormone replacement therapy.  SOCIAL HISTORY: Gina Williams was divorced in 1999.  She has been divorced twice, each marriage lasting about 15 years.  She has no children.  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  She retired from [employer] as a Merchandiser, retail. She, while not having any children of her own, does look after two mentally retarded brothers and a father who had a stroke a while ago.  She is also the caregiver of her mother.  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  In her spare time she enjoys gardening and going to church.   ADVANCED DIRECTIVES:  Not on file  HEALTH MAINTENANCE: History  Substance Use Topics  . Smoking status: Former Smoker    Quit date: 05/21/1998  . Smokeless tobacco: Never Used  . Alcohol Use: No    Colonoscopy:  Not on file PAP:  10/11/2009 Bone density:  The patient's last bone density scan on 12/30/2011 should a T score of -2.1 (osteopenia). Lipid panel:  Not on file  Allergies  Allergen Reactions  . Amoxicillin-Pot Clavulanate     REACTION: nausea (tolerates amoxil)  . Omeprazole     ineffective     Current Outpatient Prescriptions  Medication Sig Dispense Refill  . acetaminophen (TYLENOL) 500 MG tablet Take 500 mg by mouth every 4 (four) hours as needed.        . calcium carbonate (TUMS - DOSED IN MG ELEMENTAL CALCIUM) 500 MG chewable tablet Chew 1 tablet by mouth daily as needed.        . cholecalciferol (VITAMIN D) 1000 UNITS tablet Take 1,000 Units by mouth daily.        . Fish Oil-Cholecalciferol (OMEGA-3 FISH OIL-VITAMIN D3 PO) Take 1 capsule by mouth daily. Fish oil is 1200 mg and Vitamin D3 is  360 IUs      . ibuprofen (ADVIL,MOTRIN) 200 MG tablet Take 200 mg by mouth as needed.      [name] Kitchen NEXIUM 40 MG capsule TAKE 1 CAPSULE BY MOUTH DAILY BEFORE BREAKFAST.  30 capsule  5   No current facility-administered medications for this visit.    OBJECTIVE: Filed Vitals:   06/10/12 1322  BP: 112/73  Pulse: 71  Temp: 98.1 F (36.7 C)  Resp: 20     Body mass index is 23.74 kg/(m^2).      ECOG FS: 0 - Asymptomatic  General  appearance: Alert, cooperative, well nourished, no apparent distress Head: Normocephalic, without obvious abnormality, atraumatic Eyes: Arcus senilis, PERRLA, EOMI Nose: Nares, septum and mucosa are normal, no drainage or sinus tenderness Neck: No adenopathy, supple, symmetrical, trachea midline, thyroid not enlarged, no tenderness Resp: Clear to auscultation bilaterally Cardio: Regular rate and rhythm, S1, S2 normal, no murmur, click, rub or gallop Breasts:  Right breast is surgically absent, right chest wall has well-healed  surgical scars, breast is pendulous, no lymphadenopathy, no nipple inversion, no axilla fullness GI: Soft, slightly distended, non-tender, hypoactive bowel sounds, no organomegaly Extremities: Extremities normal, atraumatic, no cyanosis or edema, bilateral lower extremity varicose veins Lymph nodes: Cervical, supraclavicular, and axillary nodes normal Neurologic: Grossly normal   LAB RESULTS: Lab Results  Component Value Date   WBC 8.3 05/07/2011   NEUTROABS 5.5 05/07/2011   HGB 13.8 05/07/2011   HCT 41.5 05/07/2011   MCV 88.8 05/07/2011   PLT 266 05/07/2011      Chemistry      Component Value Date/Time   NA 140 05/07/2011 1332   K 4.2 05/07/2011 1332   CL 108 05/07/2011 1332   CO2 24 05/07/2011 1332   BUN 16 05/07/2011 1332   CREATININE 0.84 05/07/2011 1332      Component Value Date/Time   CALCIUM 9.1 05/07/2011 1332   ALKPHOS 69 05/07/2011 1332   AST 16 05/07/2011 1332   ALT 14 05/07/2011 1332   BILITOT 0.4 05/07/2011 1332       Lab Results  Component Value Date   LABCA2 16 10/24/2010    Urinalysis No results found for this basename: colorurine,  appearanceur,  labspec,  phurine,  glucoseu,  hgbur,  bilirubinur,  ketonesur,  proteinur,  urobilinogen,  nitrite,  leukocytesur    STUDIES: No results found.  ASSESSMENT: 67 y.o. [place], [place] woman: 1.  Status post right breast lateral superior needle core biopsy which showed invasive mammary  carcinoma and right breast lateral lower needle core biopsy which showed ductal carcinoma in situ on 08/12/2007, right breast lateral superior prognostic marker panel showed ER 0%, PR 0%, Ki-67 78%, HER-2/neu 3+, right breast lateral lower prognostic marker panel showed ER 97%, PR 98%.  2.  Status post 2-D echocardiogram on 09/06/2007 which showed a left ventricular ejection fraction estimated range being 60% to 65%.  3.  Status post right breast mastectomy with right axilla sentinel node biopsy on 09/16/2007 which showed a stage I, pT1a pN0, 0.4 cm invasive ductal carcinoma grade 3 and ductal carcinoma in situ high-grade, margins not involved, ER 0%, PR 0%, Ki-67 78%, HER-2/neu by FISH 3+, 0/3 positive lymph nodes.  4.  Status post adjuvant chemotherapy with Taxol and Herceptin from 10/12/2007 through 01/25/2008  x 4 cycles.  Herceptin therapy continued until 10/03/2008.  5.  The patient's last bone density scan on 12/30/2011 showed a T score of -2.1 (osteopenia).  6.  The patient's last unilateral left digital screening mammogram on 08/14/2011 showed no evidence of malignancy.  PLAN: The patient is nearing 6  years from her time of diagnosis and will officially become a graduate of [employer]'s breast cancer program today.  We asked that she continue annual clinical breast examinations by a physician in addition to annual mammography.  Her last mammogram results are listed above. She is due for her annual mammogram in 07/2012, and we will order this for her.  The patient is taking calcium and vitamin D daily for osteopenia.  All questions were answered.  The patient was encouraged to contact us with any problems, questions or concerns.   Larina Bras, NP-C 06/12/2012, 11:00 PM

## 2012-06-10 NOTE — Telephone Encounter (Signed)
appts made and printed...td 

## 2012-06-29 DIAGNOSIS — Z23 Encounter for immunization: Secondary | ICD-10-CM | POA: Diagnosis not present

## 2012-06-29 DIAGNOSIS — N8111 Cystocele, midline: Secondary | ICD-10-CM | POA: Diagnosis not present

## 2012-06-29 DIAGNOSIS — Z01419 Encounter for gynecological examination (general) (routine) without abnormal findings: Secondary | ICD-10-CM | POA: Diagnosis not present

## 2012-06-29 DIAGNOSIS — Z124 Encounter for screening for malignant neoplasm of cervix: Secondary | ICD-10-CM | POA: Diagnosis not present

## 2012-06-29 DIAGNOSIS — R350 Frequency of micturition: Secondary | ICD-10-CM | POA: Diagnosis not present

## 2012-06-29 DIAGNOSIS — Z1151 Encounter for screening for human papillomavirus (HPV): Secondary | ICD-10-CM | POA: Diagnosis not present

## 2012-08-17 ENCOUNTER — Ambulatory Visit: Payer: Medicare Other

## 2012-08-18 ENCOUNTER — Ambulatory Visit: Payer: Medicare Other

## 2012-08-19 ENCOUNTER — Ambulatory Visit
Admission: RE | Admit: 2012-08-19 | Discharge: 2012-08-19 | Disposition: A | Discharge status: Home / Self Care | Payer: Medicare Other | Source: Ambulatory Visit | Attending: Family | Admitting: Family

## 2012-08-19 DIAGNOSIS — Z853 Personal history of malignant neoplasm of breast: Secondary | ICD-10-CM

## 2012-08-19 DIAGNOSIS — Z1231 Encounter for screening mammogram for malignant neoplasm of breast: Secondary | ICD-10-CM | POA: Diagnosis not present

## 2012-08-20 ENCOUNTER — Telehealth: Payer: Self-pay | Admitting: Family

## 2012-08-20 NOTE — Telephone Encounter (Signed)
Let the patient know that her recent unilateral left digital screening mammogram stated no mammographic evidence of malignancy.  The patient voiced understanding.

## 2012-10-26 ENCOUNTER — Telehealth: Payer: Self-pay | Admitting: Family Medicine

## 2012-10-26 DIAGNOSIS — Z0000 Encounter for general adult medical examination without abnormal findings: Secondary | ICD-10-CM | POA: Insufficient documentation

## 2012-10-26 NOTE — Telephone Encounter (Signed)
Message copied by Judy Pimple on Tue Oct 26, 2012  8:46 AM ------      Message from: Alvina Chou      Created: Wed Oct 20, 2012 11:27 AM      Regarding: Lab orders for Wednesday, 10.8.14       Patient is scheduled for CPX labs, please order future labs, Thanks , Terri       ------

## 2012-10-27 ENCOUNTER — Other Ambulatory Visit (INDEPENDENT_AMBULATORY_CARE_PROVIDER_SITE_OTHER): Payer: Medicare Other

## 2012-10-27 DIAGNOSIS — E785 Hyperlipidemia, unspecified: Secondary | ICD-10-CM | POA: Diagnosis not present

## 2012-10-27 DIAGNOSIS — Z0000 Encounter for general adult medical examination without abnormal findings: Secondary | ICD-10-CM | POA: Diagnosis not present

## 2012-10-27 LAB — COMPREHENSIVE METABOLIC PANEL
AST: 21 U/L (ref 0–37)
Albumin: 4.2 g/dL (ref 3.5–5.2)
Alkaline Phosphatase: 67 U/L (ref 39–117)
BUN: 15 mg/dL (ref 6–23)
Creatinine, Ser: 0.9 mg/dL (ref 0.4–1.2)
Potassium: 4.1 mEq/L (ref 3.5–5.1)

## 2012-10-27 LAB — CBC WITH DIFFERENTIAL/PLATELET
Basophils Relative: 0.9 % (ref 0.0–3.0)
Eosinophils Absolute: 0.2 10*3/uL (ref 0.0–0.7)
MCHC: 34.1 g/dL (ref 30.0–36.0)
MCV: 87.6 fl (ref 78.0–100.0)
Monocytes Absolute: 0.6 10*3/uL (ref 0.1–1.0)
Neutrophils Relative %: 63.7 % (ref 43.0–77.0)
Platelets: 302 10*3/uL (ref 150.0–400.0)
RBC: 4.98 Mil/uL (ref 3.87–5.11)
RDW: 13.2 % (ref 11.5–14.6)

## 2012-10-27 LAB — LIPID PANEL
HDL: 51.7 mg/dL (ref >39.00)
Total CHOL/HDL Ratio: 4
Triglycerides: 83 mg/dL (ref 0.0–149.0)

## 2012-10-27 LAB — TSH: TSH: 1.4 u[IU]/mL (ref 0.35–5.50)

## 2012-11-03 ENCOUNTER — Ambulatory Visit (INDEPENDENT_AMBULATORY_CARE_PROVIDER_SITE_OTHER): Payer: Medicare Other | Admitting: Family Medicine

## 2012-11-03 ENCOUNTER — Encounter: Payer: Self-pay | Admitting: Family Medicine

## 2012-11-03 VITALS — BP 108/66 | HR 77 | Temp 97.3°F | Ht 63.0 in | Wt 135.5 lb

## 2012-11-03 DIAGNOSIS — Z853 Personal history of malignant neoplasm of breast: Secondary | ICD-10-CM | POA: Diagnosis not present

## 2012-11-03 DIAGNOSIS — E785 Hyperlipidemia, unspecified: Secondary | ICD-10-CM | POA: Diagnosis not present

## 2012-11-03 DIAGNOSIS — Z0000 Encounter for general adult medical examination without abnormal findings: Secondary | ICD-10-CM | POA: Diagnosis not present

## 2012-11-03 DIAGNOSIS — Z1211 Encounter for screening for malignant neoplasm of colon: Secondary | ICD-10-CM | POA: Diagnosis not present

## 2012-11-03 MED ORDER — ESOMEPRAZOLE MAGNESIUM 40 MG PO CPDR: 40.0000 mg | DELAYED_RELEASE_CAPSULE | Freq: Every day | ORAL | Status: DC

## 2012-11-03 NOTE — Patient Instructions (Addendum)
Please do IFOB card for colon cancer screening  Cholesterol went up and we want to watch that (Avoid red meat/ fried foods/ egg yolks/ fatty breakfast meats/ butter, cheese and high fat dairy/ and shellfish  ) Take care of yourself

## 2012-11-03 NOTE — Assessment & Plan Note (Signed)
Disc goals for lipids and reasons to control them Rev labs with pt Rev low sat fat diet in detail  Higher than past Info given  Re check  1y

## 2012-11-03 NOTE — Assessment & Plan Note (Signed)
Reviewed health habits including diet and exercise and skin cancer prevention Also reviewed health mt list, fam hx and immunizations  See HPI Rev last labs

## 2012-11-03 NOTE — Progress Notes (Signed)
Subjective:    Patient ID: Gina Williams, female    DOB: 1945-12-15, 67 y.o.   MRN: [id number]  HPI I have personally reviewed the Medicare Annual Wellness questionnaire and have noted 1. The patient's medical and social history 2. Their use of alcohol, tobacco or illicit drugs 3. Their current medications and supplements 4. The patient's functional ability including ADL's, fall risks, home safety risks and hearing or visual             impairment. 5. Diet and physical activities 6. Evidence for depression or mood disorders  The patients weight, height, BMI have been recorded in the chart and visual acuity is per eye clinic.  I have made referrals, counseling and provided education to the patient based review of the above and I have provided the pt with a written personalized care plan for preventive services.  Nothing new going on   See scanned forms.  Routine anticipatory guidance given to patient.  See health maintenance. Flu- has not had a flu vaccine, thinks she got sick from one  Shingles- declines vaccine  PNA= declines  Tetanus- had it last year  Colon cancer screen - not ready for a colonoscopy yet- will do a stool card  Breast cancer screening-hx of breast cancer - was turned loose from her oncologist  unilat mammo 7/14  Pap smear also 7/14 - normal  Self exam - no lumps or changes  Advance directive does not have a living will - got papers and has not filled them out - she will work on that  Cognitive function addressed- see scanned forms- and if abnormal then additional documentation follows.   Falls none   Mood- is pretty perky despite stress- does pretty well/ not depressed   PMH and SH reviewed  Meds, vitals, and allergies reviewed.   ROS: See HPI.  Otherwise negative.    Is very very active  Labs:     Chemistry      Component Value Date/Time   NA 143 10/27/2012 0836   K 4.1 10/27/2012 0836   CL 107 10/27/2012 0836   CO2 26 10/27/2012 0836   BUN 15  10/27/2012 0836   CREATININE 0.9 10/27/2012 0836      Component Value Date/Time   CALCIUM 9.4 10/27/2012 0836   ALKPHOS 67 10/27/2012 0836   AST 21 10/27/2012 0836   ALT 22 10/27/2012 0836   BILITOT 0.9 10/27/2012 0836     Lab Results  Component Value Date   CHOL 228* 10/27/2012   CHOL 182 01/08/2010   Lab Results  Component Value Date   HDL 51.70 10/27/2012   HDL 40.98 01/08/2010   Lab Results  Component Value Date   LDLCALC 127* 01/08/2010   Lab Results  Component Value Date   TRIG 83.0 10/27/2012   TRIG 73.0 01/08/2010   Lab Results  Component Value Date   CHOLHDL 4 10/27/2012   CHOLHDL 4 01/08/2010   Lab Results  Component Value Date   LDLDIRECT 156.2 10/27/2012   gets more exercise  Eating too many fatty sweets lately   Patient Active Problem List   Diagnosis Date Noted  . Encounter for Medicare annual wellness exam 10/26/2012  . Skin rash 12/26/2010  . GERD 01/23/2010  . NEOPLASM, MALIGNANT, BREAST, HX OF 01/23/2010   Past Medical History  Diagnosis Date  . Osteopenia     In the past, now nl. BMD  . Breast cancer 09/16/2007    Mastectomy  . Vertigo 2007  .  GERD (gastroesophageal reflux disease)    Past Surgical History  Procedure Laterality Date  . Mastectomy  8/09    Breast Cancer  . Stress myoview  12/05    Low risk  . Nuclear stress test      Negative  EF 77%  . Hand surgery Left 10/2005    Nerve damage  . Ganglion cyst excision Left 1970s    Multiple surgeries  . Tonsillectomy  Late 1970s  . Hand surgery Left 2011    Torn ligament surgery  . Shoulder surgery Bilateral Late 1980s    Frozen shoulders bilaterally   History  Substance Use Topics  . Smoking status: Former Smoker    Quit date: 05/21/1998  . Smokeless tobacco: Never Used  . Alcohol Use: No   Family History  Problem Relation Age of Onset  . Asthma Mother   . Aortic aneurysm Mother   . Stroke Maternal Uncle   . Hypertension Other     HTN on Father's side of family  . Stroke  Father   . Alzheimer's disease Father   . Cancer Paternal Aunt     Breast Cancer  . Cancer Paternal Aunt     Breast Cancer  . Cancer Paternal Aunt     Breast Cancer   Allergies  Allergen Reactions  . Amoxicillin-Pot Clavulanate     REACTION: nausea (tolerates amoxil)  . Omeprazole     ineffective    Current Outpatient Prescriptions on File Prior to Visit  Medication Sig Dispense Refill  . acetaminophen (TYLENOL) 500 MG tablet Take 500 mg by mouth every 4 (four) hours as needed.        . calcium carbonate (TUMS - DOSED IN MG ELEMENTAL CALCIUM) 500 MG chewable tablet Chew 1 tablet by mouth daily as needed.        . cholecalciferol (VITAMIN D) 1000 UNITS tablet Take 1,000 Units by mouth daily.        . Fish Oil-Cholecalciferol (OMEGA-3 FISH OIL-VITAMIN D3 PO) Take 1 capsule by mouth daily. Fish oil is 1200 mg and Vitamin D3 is  360 IUs      . ibuprofen (ADVIL,MOTRIN) 200 MG tablet Take 200 mg by mouth as needed.      [name] Kitchen NEXIUM 40 MG capsule TAKE 1 CAPSULE BY MOUTH DAILY BEFORE BREAKFAST.  30 capsule  5   No current facility-administered medications on file prior to visit.     Review of Systems Review of Systems  Constitutional: Negative for fever, appetite change, fatigue and unexpected weight change.  Eyes: Negative for pain and visual disturbance.  Respiratory: Negative for cough and shortness of breath.   Cardiovascular: Negative for cp or palpitations    Gastrointestinal: Negative for nausea, diarrhea and constipation.  Genitourinary: Negative for urgency and frequency.  Skin: Negative for pallor or rash   Neurological: Negative for weakness, light-headedness, numbness and headaches.  Hematological: Negative for adenopathy. Does not bruise/bleed easily.  Psychiatric/Behavioral: Negative for dysphoric mood. The patient is not nervous/anxious.          Objective:   Physical Exam  Constitutional: She appears well-developed and well-nourished. No distress.  HENT:  Head:  Normocephalic and atraumatic.  Right Ear: External ear normal.  Left Ear: External ear normal.  Mouth/Throat: Oropharynx is clear and moist.  Eyes: Conjunctivae and EOM are normal. Pupils are equal, round, and reactive to light. No scleral icterus.  Neck: Normal range of motion. Neck supple. No JVD present. Carotid bruit is not present. No thyromegaly  present.  Cardiovascular: Normal rate, regular rhythm, normal heart sounds and intact distal pulses.  Exam reveals no gallop.   Pulmonary/Chest: Effort normal and breath sounds normal. No respiratory distress. She has no wheezes. She exhibits no tenderness.  Abdominal: Soft. Bowel sounds are normal. She exhibits no distension, no abdominal bruit and no mass. There is no tenderness.  Genitourinary:  Per gyn visit 7/14   Musculoskeletal: Normal range of motion. She exhibits no edema and no tenderness.  Lymphadenopathy:    She has no cervical adenopathy.  Neurological: She is alert. She has normal reflexes. No cranial nerve deficit. She exhibits normal muscle tone. Coordination normal.  Skin: Skin is warm and dry. No rash noted. No erythema. No pallor.  SKs diffusely  Psychiatric: She has a normal mood and affect.          Assessment & Plan:

## 2012-11-04 NOTE — Assessment & Plan Note (Signed)
D/w patient [id number] for colon cancer screening, including IFOB vs. colonoscopy.  Risks and benefits of both were discussed and patient voiced understanding.  Pt elects for: IFOB (she declines colonosc)

## 2012-11-04 NOTE — Assessment & Plan Note (Signed)
Doing well -nl unilat mammogram  No longer seeing oncology Nl self exam  Gyn does her yearly gyn care

## 2012-11-16 ENCOUNTER — Other Ambulatory Visit (INDEPENDENT_AMBULATORY_CARE_PROVIDER_SITE_OTHER): Payer: Medicare Other

## 2012-11-16 DIAGNOSIS — Z1211 Encounter for screening for malignant neoplasm of colon: Secondary | ICD-10-CM | POA: Diagnosis not present

## 2012-11-17 ENCOUNTER — Telehealth: Payer: Self-pay | Admitting: Family Medicine

## 2012-11-17 DIAGNOSIS — R195 Other fecal abnormalities: Secondary | ICD-10-CM

## 2012-11-17 DIAGNOSIS — Z1211 Encounter for screening for malignant neoplasm of colon: Secondary | ICD-10-CM

## 2012-11-17 NOTE — Telephone Encounter (Signed)
Message copied by Judy Pimple on Wed Nov 17, 2012  1:28 PM ------      Message from: Shon Millet      Created: Wed Nov 17, 2012 12:53 PM       Pt notified of ifob results and agrees with colonoscopy, I advise pt Marion/Adlean will call to schedule appt ------

## 2012-11-17 NOTE — Telephone Encounter (Signed)
GI ref 

## 2012-11-18 ENCOUNTER — Ambulatory Visit (INDEPENDENT_AMBULATORY_CARE_PROVIDER_SITE_OTHER): Payer: Medicare Other

## 2012-11-18 DIAGNOSIS — Z23 Encounter for immunization: Secondary | ICD-10-CM

## 2012-12-15 DIAGNOSIS — K921 Melena: Secondary | ICD-10-CM | POA: Diagnosis not present

## 2012-12-15 DIAGNOSIS — Z1211 Encounter for screening for malignant neoplasm of colon: Secondary | ICD-10-CM | POA: Diagnosis not present

## 2013-01-31 DIAGNOSIS — Z1211 Encounter for screening for malignant neoplasm of colon: Secondary | ICD-10-CM | POA: Diagnosis not present

## 2013-04-01 DIAGNOSIS — K2100 Gastro-esophageal reflux disease with esophagitis, without bleeding: Secondary | ICD-10-CM | POA: Diagnosis not present

## 2013-04-01 DIAGNOSIS — K921 Melena: Secondary | ICD-10-CM | POA: Diagnosis not present

## 2013-04-11 DIAGNOSIS — Z961 Presence of intraocular lens: Secondary | ICD-10-CM | POA: Diagnosis not present

## 2013-04-11 DIAGNOSIS — H04129 Dry eye syndrome of unspecified lacrimal gland: Secondary | ICD-10-CM | POA: Diagnosis not present

## 2013-04-11 DIAGNOSIS — H52209 Unspecified astigmatism, unspecified eye: Secondary | ICD-10-CM | POA: Diagnosis not present

## 2013-07-07 DIAGNOSIS — D485 Neoplasm of uncertain behavior of skin: Secondary | ICD-10-CM | POA: Diagnosis not present

## 2013-07-07 DIAGNOSIS — D235 Other benign neoplasm of skin of trunk: Secondary | ICD-10-CM | POA: Diagnosis not present

## 2013-07-19 ENCOUNTER — Other Ambulatory Visit: Payer: Self-pay

## 2013-07-19 DIAGNOSIS — Z853 Personal history of malignant neoplasm of breast: Secondary | ICD-10-CM

## 2013-07-19 DIAGNOSIS — Z9011 Acquired absence of right breast and nipple: Secondary | ICD-10-CM

## 2013-07-19 DIAGNOSIS — Z1231 Encounter for screening mammogram for malignant neoplasm of breast: Secondary | ICD-10-CM

## 2013-08-31 ENCOUNTER — Ambulatory Visit: Payer: Medicare Other

## 2013-09-07 ENCOUNTER — Other Ambulatory Visit: Payer: Self-pay

## 2013-09-07 ENCOUNTER — Ambulatory Visit
Admission: RE | Admit: 2013-09-07 | Discharge: 2013-09-07 | Disposition: A | Discharge status: Home / Self Care | Payer: Medicare Other | Source: Ambulatory Visit

## 2013-09-07 DIAGNOSIS — Z853 Personal history of malignant neoplasm of breast: Secondary | ICD-10-CM

## 2013-09-07 DIAGNOSIS — Z1231 Encounter for screening mammogram for malignant neoplasm of breast: Secondary | ICD-10-CM

## 2013-09-08 ENCOUNTER — Encounter: Payer: Self-pay | Admitting: *Deleted

## 2013-11-04 ENCOUNTER — Other Ambulatory Visit: Payer: Self-pay | Admitting: Family Medicine

## 2013-11-07 NOTE — Telephone Encounter (Signed)
Please schedule appt for annual exam and refill until then

## 2013-11-07 NOTE — Telephone Encounter (Signed)
Pt left v/m requesting status of refill. Pt request cb.

## 2013-11-07 NOTE — Telephone Encounter (Signed)
Electronic refill request, no recent/future appt., please advise  

## 2013-11-08 NOTE — Telephone Encounter (Signed)
Pt is out of town so she will call back tomorrow to schedule appt

## 2013-11-10 NOTE — Telephone Encounter (Signed)
appt scheduled with front desk and med refilled

## 2013-11-10 NOTE — Telephone Encounter (Signed)
Pt called to verify nexium had been refilled; advised pt sent to [facility] this AM at 8:30. Pt voiced understanding.

## 2013-11-13 ENCOUNTER — Telehealth: Payer: Self-pay | Admitting: Family Medicine

## 2013-11-13 DIAGNOSIS — Z0000 Encounter for general adult medical examination without abnormal findings: Secondary | ICD-10-CM

## 2013-11-13 DIAGNOSIS — E785 Hyperlipidemia, unspecified: Secondary | ICD-10-CM

## 2013-11-13 NOTE — Telephone Encounter (Signed)
Message copied by Abner Greenspan on Sun Nov 13, 2013 10:44 AM ------      Message from: Ellamae Sia      Created: Wed Nov 09, 2013  4:19 PM      Regarding: Lab orders for Monday, 10.26.15       Patient is scheduled for CPX labs, please order future labs, Thanks , Terri       ------

## 2013-11-14 ENCOUNTER — Other Ambulatory Visit (INDEPENDENT_AMBULATORY_CARE_PROVIDER_SITE_OTHER): Payer: Medicare Other

## 2013-11-14 DIAGNOSIS — E785 Hyperlipidemia, unspecified: Secondary | ICD-10-CM | POA: Diagnosis not present

## 2013-11-14 DIAGNOSIS — Z0000 Encounter for general adult medical examination without abnormal findings: Secondary | ICD-10-CM | POA: Diagnosis not present

## 2013-11-14 LAB — LIPID PANEL
CHOLESTEROL: 196 mg/dL (ref 0–200)
HDL: 50.9 mg/dL (ref >39.00)
LDL Cholesterol: 129 mg/dL — ABNORMAL HIGH (ref 0–99)
NONHDL: 145.1
Total CHOL/HDL Ratio: 4
Triglycerides: 83 mg/dL (ref 0.0–149.0)
VLDL: 16.6 mg/dL (ref 0.0–40.0)

## 2013-11-14 LAB — COMPREHENSIVE METABOLIC PANEL
ALT: 17 U/L (ref 0–35)
AST: 20 U/L (ref 0–37)
Albumin: 3.5 g/dL (ref 3.5–5.2)
Alkaline Phosphatase: 66 U/L (ref 39–117)
BUN: 12 mg/dL (ref 6–23)
CO2: 25 mEq/L (ref 19–32)
Calcium: 9.2 mg/dL (ref 8.4–10.5)
Chloride: 105 mEq/L (ref 96–112)
Creatinine, Ser: 0.9 mg/dL (ref 0.4–1.2)
GFR: 64.33 mL/min (ref >60.00)
Glucose, Bld: 81 mg/dL (ref 70–99)
Potassium: 4.2 mEq/L (ref 3.5–5.1)
Sodium: 138 mEq/L (ref 135–145)
Total Bilirubin: 0.7 mg/dL (ref 0.2–1.2)
Total Protein: 7.2 g/dL (ref 6.0–8.3)

## 2013-11-14 LAB — CBC WITH DIFFERENTIAL/PLATELET
Basophils Absolute: 0 10*3/uL (ref 0.0–0.1)
Basophils Relative: 0.6 % (ref 0.0–3.0)
Eosinophils Absolute: 0.2 10*3/uL (ref 0.0–0.7)
Eosinophils Relative: 2.3 % (ref 0.0–5.0)
HCT: 43.8 % (ref 36.0–46.0)
Hemoglobin: 14.3 g/dL (ref 12.0–15.0)
Lymphocytes Relative: 26.1 % (ref 12.0–46.0)
Lymphs Abs: 2.1 10*3/uL (ref 0.7–4.0)
MCHC: 32.7 g/dL (ref 30.0–36.0)
MCV: 90.6 fl (ref 78.0–100.0)
Monocytes Absolute: 0.7 10*3/uL (ref 0.1–1.0)
Monocytes Relative: 9.1 % (ref 3.0–12.0)
Neutro Abs: 5 10*3/uL (ref 1.4–7.7)
Neutrophils Relative %: 61.9 % (ref 43.0–77.0)
Platelets: 282 10*3/uL (ref 150.0–400.0)
RBC: 4.83 Mil/uL (ref 3.87–5.11)
RDW: 13.4 % (ref 11.5–15.5)
WBC: 8.1 10*3/uL (ref 4.0–10.5)

## 2013-11-14 LAB — TSH: TSH: 1.82 u[IU]/mL (ref 0.35–4.50)

## 2013-11-18 ENCOUNTER — Encounter: Payer: Medicare Other | Admitting: Family Medicine

## 2013-11-28 ENCOUNTER — Ambulatory Visit (INDEPENDENT_AMBULATORY_CARE_PROVIDER_SITE_OTHER): Payer: Medicare Other | Admitting: Family Medicine

## 2013-11-28 ENCOUNTER — Encounter: Payer: Self-pay | Admitting: Family Medicine

## 2013-11-28 VITALS — BP 102/66 | HR 83 | Temp 98.0°F | Ht 63.0 in | Wt 136.5 lb

## 2013-11-28 DIAGNOSIS — E785 Hyperlipidemia, unspecified: Secondary | ICD-10-CM | POA: Diagnosis not present

## 2013-11-28 DIAGNOSIS — Z0000 Encounter for general adult medical examination without abnormal findings: Secondary | ICD-10-CM | POA: Diagnosis not present

## 2013-11-28 DIAGNOSIS — M8580 Other specified disorders of bone density and structure, unspecified site: Secondary | ICD-10-CM | POA: Diagnosis not present

## 2013-11-28 DIAGNOSIS — K219 Gastro-esophageal reflux disease without esophagitis: Secondary | ICD-10-CM

## 2013-11-28 MED ORDER — NEXIUM 40 MG PO CPDR: DELAYED_RELEASE_CAPSULE | ORAL | Status: DC

## 2013-11-28 NOTE — Assessment & Plan Note (Signed)
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

## 2013-11-28 NOTE — Progress Notes (Signed)
Subjective:    Patient ID: Gina Williams, female    DOB: 1945-02-27, 68 y.o.   MRN: [id number]  HPI Here for annual medicare wellness visit and also for chronic medical problems  Wt is up 1 lb with bmi of 24  I have personally reviewed the Medicare Annual Wellness questionnaire and have noted 1. The patient's medical and social history 2. Their use of alcohol, tobacco or illicit drugs 3. Their current medications and supplements 4. The patient's functional ability including ADL's, fall risks, home safety risks and hearing or visual             impairment. 5. Diet and physical activities 6. Evidence for depression or mood disorders  The patients weight, height, BMI have been recorded in the chart and visual acuity is per eye clinic.  I have made referrals, counseling and provided education to the patient based review of the above and I have provided the pt with a written personalized care plan for preventive services.  Doing pretty well  Can't complain   See scanned forms.  Routine anticipatory guidance given to patient.  See health maintenance. Colon cancer screening 1/15 - was normal / 10 year recall  Breast cancer screening 8/15 - was ok / unilat (hx of mastectomy for breast cancer) Self breast exam-no lumps  Still goes to gyn - 2014 (gets pap every 2 years in light of prev breast cancer)  Flu vaccine prefers not to take / declines  Tetanus vaccine 1/13 - is up to date  Pneumovax- declines  zostavax -declines    Advance directive- does not have a living will or POA - given a packet today and will look into it  Cognitive function addressed- see scanned forms- and if abnormal then additional documentation follows. - she sees some changes with age/ but no significant problems  She is active / not a big social person - she does talk a lot on the phone quite a bit  She has to care for a lot of people and keep track of their appointments   PMH and SH reviewed  Meds,  vitals, and allergies reviewed.   ROS: See HPI.  Otherwise negative.     Hyperlipidemia Lab Results  Component Value Date   CHOL 196 11/14/2013   CHOL 228* 10/27/2012   CHOL 182 01/08/2010   Lab Results  Component Value Date   HDL 50.90 11/14/2013   HDL 51.70 10/27/2012   HDL 40.60 01/08/2010   Lab Results  Component Value Date   LDLCALC 129* 11/14/2013   LDLCALC 127* 01/08/2010   Lab Results  Component Value Date   TRIG 83.0 11/14/2013   TRIG 83.0 10/27/2012   TRIG 73.0 01/08/2010   Lab Results  Component Value Date   CHOLHDL 4 11/14/2013   CHOLHDL 4 10/27/2012   CHOLHDL 4 01/08/2010   Lab Results  Component Value Date   LDLDIRECT 156.2 10/27/2012   diet controlled  Has managed to keep this stable  She cut back on fats (ice cream)  Walks regularly  Likes candy quite a bit   GERD-on nexium and well controlled  On DAW and ins will stop paying for it next year unfortunately  No heartburn while on this   Osteopenia - dexa was 12/13  She would like to get that done in Jan or Feb  No falls or fx   Patient Active Problem List   Diagnosis Date Noted  . Osteopenia 11/28/2013  . Positive occult stool  blood test 11/17/2012  . Colon cancer screening 11/03/2012  . Hyperlipidemia 11/03/2012  . Encounter for Medicare annual wellness exam 10/26/2012  . Skin rash 12/26/2010  . GERD 01/23/2010  . NEOPLASM, MALIGNANT, BREAST, HX OF 01/23/2010   Past Medical History  Diagnosis Date  . Osteopenia     In the past, now nl. BMD  . Breast cancer 09/16/2007    Mastectomy  . Vertigo 2007  . GERD (gastroesophageal reflux disease)    Past Surgical History  Procedure Laterality Date  . Mastectomy  8/09    Breast Cancer  . Stress myoview  12/05    Low risk  . Nuclear stress test      Negative  EF 77%  . Hand surgery Left 10/2005    Nerve damage  . Ganglion cyst excision Left 1970s    Multiple surgeries  . Tonsillectomy  Late 1970s  . Hand surgery Left 2011     Torn ligament surgery  . Shoulder surgery Bilateral Late 1980s    Frozen shoulders bilaterally   History  Substance Use Topics  . Smoking status: Former Smoker    Quit date: 05/21/1998  . Smokeless tobacco: Never Used  . Alcohol Use: No   Family History  Problem Relation Age of Onset  . Asthma Mother   . Aortic aneurysm Mother   . Stroke Maternal Uncle   . Hypertension Other     HTN on Father's side of family  . Stroke Father   . Alzheimer's disease Father   . Cancer Paternal Aunt     Breast Cancer  . Cancer Paternal Aunt     Breast Cancer  . Cancer Paternal Aunt     Breast Cancer   Allergies  Allergen Reactions  . Amoxicillin-Pot Clavulanate     REACTION: nausea (tolerates amoxil)  . Omeprazole     ineffective    Current Outpatient Prescriptions on File Prior to Visit  Medication Sig Dispense Refill  . NEXIUM 40 MG capsule TAKE ONE (1) CAPSULE BY MOUTH EACH DAY BEFORE BREAKFAST 30 capsule 0   No current facility-administered medications on file prior to visit.       Review of Systems    Review of Systems  Constitutional: Negative for fever, appetite change, fatigue and unexpected weight change.  Eyes: Negative for pain and visual disturbance.  Respiratory: Negative for cough and shortness of breath.   Cardiovascular: Negative for cp or palpitations    Gastrointestinal: Negative for nausea, diarrhea and constipation.  Genitourinary: Negative for urgency and frequency.  Skin: Negative for pallor or rash   Neurological: Negative for weakness, light-headedness, numbness and headaches.  Hematological: Negative for adenopathy. Does not bruise/bleed easily.  Psychiatric/Behavioral: Negative for dysphoric mood. The patient is not nervous/anxious.      Objective:   Physical Exam  Constitutional: She appears well-developed and well-nourished. No distress.  HENT:  Head: Normocephalic and atraumatic.  Right Ear: External ear normal.  Left Ear: External ear  normal.  Nose: Nose normal.  Mouth/Throat: Oropharynx is clear and moist.  Eyes: Conjunctivae and EOM are normal. Pupils are equal, round, and reactive to light. Right eye exhibits no discharge. Left eye exhibits no discharge. No scleral icterus.  Neck: Normal range of motion. Neck supple. No JVD present. No thyromegaly present.  Cardiovascular: Normal rate, regular rhythm, normal heart sounds and intact distal pulses.  Exam reveals no gallop.   Pulmonary/Chest: Effort normal and breath sounds normal. No respiratory distress. She has no wheezes. She has  no rales.  Abdominal: Soft. Bowel sounds are normal. She exhibits no distension and no mass. There is no tenderness.  Genitourinary:  L breast --Breast exam: No mass, nodules, thickening, tenderness, bulging, retraction, inflamation, nipple discharge or skin changes noted.  No axillary or clavicular LA.    S/p R mastectomy-site is clear   Musculoskeletal: She exhibits no edema or tenderness.  Lymphadenopathy:    She has no cervical adenopathy.  Neurological: She is alert. She has normal reflexes. No cranial nerve deficit. She exhibits normal muscle tone. Coordination normal.  Skin: Skin is warm and dry. No rash noted. No erythema. No pallor.  Psychiatric: She has a normal mood and affect.          Assessment & Plan:   Problem List Items Addressed This Visit      Digestive   GERD    Refilled nexium which works well  She pref DAW -generic has not worked well for her in the past  Unsure if ins will continue to cover this     Relevant Medications      NEXIUM 40 MG capsule     Musculoskeletal and Integument   Osteopenia - Primary    Disc need for calcium/ vitamin D/ wt bearing exercise and bone density test every 2 y to monitor Disc safety/ fracture risk in detail   Schedule dexa for early 2016- 2 year follow up  No falls or fractures     Relevant Orders      DG Bone Density     Other   Encounter for Medicare annual wellness  exam    Reviewed health habits including diet and exercise and skin cancer prevention Reviewed appropriate screening tests for age  Also reviewed health mt list, fam hx and immunization status , as well as social and family history   See HPI 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

## 2013-11-28 NOTE — Assessment & Plan Note (Signed)
Disc need for calcium/ vitamin D/ wt bearing exercise and bone density test every 2 y to monitor Disc safety/ fracture risk in detail   Schedule dexa for early 2016- 2 year follow up  No falls or fractures

## 2013-11-28 NOTE — Patient Instructions (Signed)
Work on an Scientist, physiological - take a look at the materials I gave you  Stop at check out for referral for bone density test Take calcium and vitamin D for your bones   (Try to get 1200-1500 mg of calcium per day with at least 1000 iu of vitamin D - for bone health) Labs look ok overall  For cholesterol (Avoid red meat/ fried foods/ egg yolks/ fatty breakfast meats/ butter, cheese and high fat dairy/ and shellfish )

## 2013-11-28 NOTE — Assessment & Plan Note (Signed)
Fairly stable Disc goals for lipids and reasons to control them Rev labs with pt Rev low sat fat diet in detail

## 2013-11-28 NOTE — Progress Notes (Signed)
Pre visit review using our clinic review tool, if applicable. No additional management support is needed unless otherwise documented below in the visit note. 

## 2013-11-28 NOTE — Assessment & Plan Note (Signed)
Refilled nexium which works well  She pref DAW -generic has not worked well for her in the past  Unsure if ins will continue to cover this

## 2014-04-25 DIAGNOSIS — H01004 Unspecified blepharitis left upper eyelid: Secondary | ICD-10-CM | POA: Diagnosis not present

## 2014-04-25 DIAGNOSIS — H01001 Unspecified blepharitis right upper eyelid: Secondary | ICD-10-CM | POA: Diagnosis not present

## 2014-04-25 DIAGNOSIS — Z961 Presence of intraocular lens: Secondary | ICD-10-CM | POA: Diagnosis not present

## 2014-04-25 DIAGNOSIS — H04123 Dry eye syndrome of bilateral lacrimal glands: Secondary | ICD-10-CM | POA: Diagnosis not present

## 2014-07-25 ENCOUNTER — Other Ambulatory Visit: Payer: Self-pay

## 2014-07-25 DIAGNOSIS — Z853 Personal history of malignant neoplasm of breast: Secondary | ICD-10-CM

## 2014-07-25 DIAGNOSIS — Z1231 Encounter for screening mammogram for malignant neoplasm of breast: Secondary | ICD-10-CM

## 2014-07-25 DIAGNOSIS — Z9011 Acquired absence of right breast and nipple: Secondary | ICD-10-CM

## 2014-07-26 DIAGNOSIS — N8111 Cystocele, midline: Secondary | ICD-10-CM | POA: Diagnosis not present

## 2014-07-26 DIAGNOSIS — Z124 Encounter for screening for malignant neoplasm of cervix: Secondary | ICD-10-CM | POA: Diagnosis not present

## 2014-08-05 HISTORY — PX: OTHER SURGICAL HISTORY: SHX169

## 2014-09-13 ENCOUNTER — Ambulatory Visit
Admission: RE | Admit: 2014-09-13 | Discharge: 2014-09-13 | Disposition: A | Discharge status: Home / Self Care | Payer: Medicare Other | Source: Ambulatory Visit

## 2014-09-13 DIAGNOSIS — Z853 Personal history of malignant neoplasm of breast: Secondary | ICD-10-CM

## 2014-09-13 DIAGNOSIS — Z1231 Encounter for screening mammogram for malignant neoplasm of breast: Secondary | ICD-10-CM

## 2014-09-13 DIAGNOSIS — Z9011 Acquired absence of right breast and nipple: Secondary | ICD-10-CM

## 2014-09-14 ENCOUNTER — Encounter: Payer: Self-pay | Admitting: *Deleted

## 2014-09-14 LAB — HM MAMMOGRAPHY: HM MAMMO: NORMAL

## 2014-11-23 ENCOUNTER — Telehealth: Payer: Self-pay | Admitting: Family Medicine

## 2014-11-23 DIAGNOSIS — Z0000 Encounter for general adult medical examination without abnormal findings: Secondary | ICD-10-CM

## 2014-11-23 DIAGNOSIS — E785 Hyperlipidemia, unspecified: Secondary | ICD-10-CM

## 2014-11-23 NOTE — Telephone Encounter (Signed)
-----   Message from Ellamae Sia sent at 11/21/2014  8:38 AM EDT ----- Regarding: Lab orders for Friday, 11.4.16 Patient is scheduled for CPX labs, please order future labs, Thanks , Karna Christmas

## 2014-11-24 ENCOUNTER — Other Ambulatory Visit (INDEPENDENT_AMBULATORY_CARE_PROVIDER_SITE_OTHER): Payer: Medicare Other

## 2014-11-24 DIAGNOSIS — E785 Hyperlipidemia, unspecified: Secondary | ICD-10-CM

## 2014-11-24 DIAGNOSIS — Z0000 Encounter for general adult medical examination without abnormal findings: Secondary | ICD-10-CM

## 2014-11-24 LAB — COMPREHENSIVE METABOLIC PANEL
ALK PHOS: 63 U/L (ref 39–117)
ALT: 15 U/L (ref 0–35)
AST: 16 U/L (ref 0–37)
Albumin: 3.9 g/dL (ref 3.5–5.2)
BUN: 12 mg/dL (ref 6–23)
CO2: 26 mEq/L (ref 19–32)
CREATININE: 0.82 mg/dL (ref 0.40–1.20)
Calcium: 9.4 mg/dL (ref 8.4–10.5)
Chloride: 108 mEq/L (ref 96–112)
GFR: 73.25 mL/min (ref >60.00)
GLUCOSE: 91 mg/dL (ref 70–99)
POTASSIUM: 4.2 meq/L (ref 3.5–5.1)
SODIUM: 141 meq/L (ref 135–145)
TOTAL PROTEIN: 6.7 g/dL (ref 6.0–8.3)
Total Bilirubin: 0.5 mg/dL (ref 0.2–1.2)

## 2014-11-24 LAB — CBC WITH DIFFERENTIAL/PLATELET
BASOS PCT: 0.8 % (ref 0.0–3.0)
Basophils Absolute: 0.1 10*3/uL (ref 0.0–0.1)
EOS PCT: 2.6 % (ref 0.0–5.0)
Eosinophils Absolute: 0.2 10*3/uL (ref 0.0–0.7)
HCT: 42.8 % (ref 36.0–46.0)
Hemoglobin: 14.4 g/dL (ref 12.0–15.0)
LYMPHS PCT: 23.1 % (ref 12.0–46.0)
Lymphs Abs: 1.8 10*3/uL (ref 0.7–4.0)
MCHC: 33.5 g/dL (ref 30.0–36.0)
MCV: 88.1 fl (ref 78.0–100.0)
MONOS PCT: 8.3 % (ref 3.0–12.0)
Monocytes Absolute: 0.7 10*3/uL (ref 0.1–1.0)
NEUTROS PCT: 65.2 % (ref 43.0–77.0)
Neutro Abs: 5.2 10*3/uL (ref 1.4–7.7)
Platelets: 282 10*3/uL (ref 150.0–400.0)
RBC: 4.86 Mil/uL (ref 3.87–5.11)
RDW: 13.3 % (ref 11.5–15.5)
WBC: 8 10*3/uL (ref 4.0–10.5)

## 2014-11-24 LAB — LIPID PANEL
Cholesterol: 170 mg/dL (ref 0–200)
HDL: 45.2 mg/dL (ref >39.00)
LDL CALC: 108 mg/dL — AB (ref 0–99)
NONHDL: 124.41
Total CHOL/HDL Ratio: 4
Triglycerides: 81 mg/dL (ref 0.0–149.0)
VLDL: 16.2 mg/dL (ref 0.0–40.0)

## 2014-11-24 LAB — TSH: TSH: 1.53 u[IU]/mL (ref 0.35–4.50)

## 2014-12-01 ENCOUNTER — Ambulatory Visit (INDEPENDENT_AMBULATORY_CARE_PROVIDER_SITE_OTHER): Payer: Medicare Other | Admitting: Family Medicine

## 2014-12-01 ENCOUNTER — Encounter: Payer: Self-pay | Admitting: Family Medicine

## 2014-12-01 VITALS — BP 112/66 | HR 71 | Temp 98.5°F | Ht 63.25 in | Wt 138.0 lb

## 2014-12-01 DIAGNOSIS — M8580 Other specified disorders of bone density and structure, unspecified site: Secondary | ICD-10-CM

## 2014-12-01 DIAGNOSIS — Z0000 Encounter for general adult medical examination without abnormal findings: Secondary | ICD-10-CM | POA: Insufficient documentation

## 2014-12-01 DIAGNOSIS — E785 Hyperlipidemia, unspecified: Secondary | ICD-10-CM

## 2014-12-01 DIAGNOSIS — E2839 Other primary ovarian failure: Secondary | ICD-10-CM | POA: Insufficient documentation

## 2014-12-01 NOTE — Progress Notes (Signed)
Pre visit review using our clinic review tool, if applicable. No additional management support is needed unless otherwise documented below in the visit note. 

## 2014-12-01 NOTE — Patient Instructions (Signed)
Make appointment with Dr Lorelei Pont about your hip Stop at check out for referral for bone density test  Try to get 1200-1500 mg of calcium per day with at least 1000 iu of vitamin D - for bone health  Please work on an advance directive as we discussed   Take care of yourself

## 2014-12-01 NOTE — Progress Notes (Signed)
Subjective:    Patient ID: Gina Williams, female    DOB: [birth year], 69 y.o.   MRN: [id number]  HPI Here for annual medicare wellness visit as well as chronic/acute medical problems as well as annual preventative exam  I have personally reviewed the Medicare Annual Wellness questionnaire and have noted 1. The patient's medical and social history 2. Their use of alcohol, tobacco or illicit drugs 3. Their current medications and supplements 4. The patient's functional ability including ADL's, fall risks, home safety risks and hearing or visual             impairment. 5. Diet and physical activities 6. Evidence for depression or mood disorders  The patients weight, height, BMI have been recorded in the chart and visual acuity is per eye clinic.  I have made referrals, counseling and provided education to the patient based review of the above and I have provided the pt with a written personalized care plan for preventive services. Reviewed and updated provider list, see scanned forms.  Has been doing very well   Is starting to have R hip    See scanned forms.  Routine anticipatory guidance given to patient.  See health maintenance. Colon cancer screening 1/15 - 10 year recall  Breast cancer screening 8/16 nl  Self breast exam-no lumps /is a cancer survivor with unilat mastectomy (gyn did a breast exam also) Gets pap every 2 y from gyn as well  Flu vaccine- declines  Tetanus vaccine 2013 up to date  Pneumovax- declines  Zoster vaccine - declines  dexa 12/13 osteopenia, wants to set that up (breast [facility]), no falls or fractures , not taking ca or D Advance directive - she does not have it (lost living will/ no POA)  Cognitive function addressed- see scanned forms- and if abnormal then additional documentation follows. - No big concerns about memory    PMH and SH reviewed  Meds, vitals, and allergies reviewed.   ROS: See HPI.  Otherwise negative.    Hx of  hyperlipidemia Lab Results  Component Value Date   CHOL 170 11/24/2014   CHOL 196 11/14/2013   CHOL 228* 10/27/2012   Lab Results  Component Value Date   HDL 45.20 11/24/2014   HDL 50.90 11/14/2013   HDL 51.70 10/27/2012   Lab Results  Component Value Date   LDLCALC 108* 11/24/2014   LDLCALC 129* 11/14/2013   LDLCALC 127* 01/08/2010   Lab Results  Component Value Date   TRIG 81.0 11/24/2014   TRIG 83.0 11/14/2013   TRIG 83.0 10/27/2012   Lab Results  Component Value Date   CHOLHDL 4 11/24/2014   CHOLHDL 4 11/14/2013   CHOLHDL 4 10/27/2012   Lab Results  Component Value Date   LDLDIRECT 156.2 10/27/2012   Overall improved LDL and slt dec HDL  Omega 3 and she still exercises (gardening a lot)  Eats a lot of fruit and veg  Some ice cream Otherwise avoids fats    Patient Active Problem List   Diagnosis Date Noted  . Routine general medical examination at a health care facility 12/01/2014  . Osteopenia 11/28/2013  . Colon cancer screening 11/03/2012  . Hyperlipidemia 11/03/2012  . Encounter for Medicare annual wellness exam 10/26/2012  . GERD 01/23/2010  . NEOPLASM, MALIGNANT, BREAST, HX OF 01/23/2010   Past Medical History  Diagnosis Date  . Osteopenia     In the past, now nl. BMD  . Breast cancer ([place]) 09/16/2007    Mastectomy  .  Vertigo 2007  . GERD (gastroesophageal reflux disease)    Past Surgical History  Procedure Laterality Date  . Mastectomy  8/09    Breast Cancer  . Stress myoview  12/05    Low risk  . Nuclear stress test      Negative  EF 77%  . Hand surgery Left 10/2005    Nerve damage  . Ganglion cyst excision Left 1970s    Multiple surgeries  . Tonsillectomy  Late 1970s  . Hand surgery Left 2011    Torn ligament surgery  . Shoulder surgery Bilateral Late 1980s    Frozen shoulders bilaterally   Social History  Substance Use Topics  . Smoking status: Former Smoker    Quit date: 05/21/1998  . Smokeless tobacco: Never Used  .  Alcohol Use: No   Family History  Problem Relation Age of Onset  . Asthma Mother   . Aortic aneurysm Mother   . Stroke Maternal Uncle   . Hypertension Other     HTN on Father's side of family  . Stroke Father   . Alzheimer's disease Father   . Cancer Paternal Aunt     Breast Cancer  . Cancer Paternal Aunt     Breast Cancer  . Cancer Paternal Aunt     Breast Cancer   Allergies  Allergen Reactions  . Amoxicillin-Pot Clavulanate     REACTION: nausea (tolerates amoxil)  . Omeprazole     ineffective    Current Outpatient Prescriptions on File Prior to Visit  Medication Sig Dispense Refill  . NEXIUM 40 MG capsule TAKE ONE (1) CAPSULE BY MOUTH EACH DAY BEFORE BREAKFAST 30 capsule 11   No current facility-administered medications on file prior to visit.    Review of Systems Review of Systems  Constitutional: Negative for fever, appetite change, fatigue and unexpected weight change.  Eyes: Negative for pain and visual disturbance.  Respiratory: Negative for cough and shortness of breath.   Cardiovascular: Negative for cp or palpitations    Gastrointestinal: Negative for nausea, diarrhea and constipation.  Genitourinary: Negative for urgency and frequency.  Skin: Negative for pallor or rash   Neurological: Negative for weakness, light-headedness, numbness and headaches.  Hematological: Negative for adenopathy. Does not bruise/bleed easily.  Psychiatric/Behavioral: Negative for dysphoric mood. The patient is not nervous/anxious.         Objective:   Physical Exam  Constitutional: She appears well-developed and well-nourished. No distress.  Well appearing   HENT:  Head: Normocephalic and atraumatic.  Right Ear: External ear normal.  Left Ear: External ear normal.  Mouth/Throat: Oropharynx is clear and moist.  Eyes: Conjunctivae and EOM are normal. Pupils are equal, round, and reactive to light. No scleral icterus.  Neck: Normal range of motion. Neck supple. No JVD  present. Carotid bruit is not present. No thyromegaly present.  Cardiovascular: Normal rate, regular rhythm, normal heart sounds and intact distal pulses.  Exam reveals no gallop.   Pulmonary/Chest: Effort normal and breath sounds normal. No respiratory distress. She has no wheezes. She exhibits no tenderness.  Abdominal: Soft. Bowel sounds are normal. She exhibits no distension, no abdominal bruit and no mass. There is no tenderness.  Genitourinary: No breast swelling, tenderness, discharge or bleeding.  S/p R mastectomy- site looks normal  L breast: Breast exam: No mass, nodules, thickening, tenderness, bulging, retraction, inflamation, nipple discharge or skin changes noted.  No axillary or clavicular LA.      Musculoskeletal: Normal range of motion. She exhibits no edema  or tenderness.  No kyphosis   Lymphadenopathy:    She has no cervical adenopathy.  Neurological: She is alert. She has normal reflexes. No cranial nerve deficit. She exhibits normal muscle tone. Coordination normal.  Skin: Skin is warm and dry. No rash noted. No erythema. No pallor.  Psychiatric: She has a normal mood and affect.          Assessment & Plan:   Problem List Items Addressed This Visit      Musculoskeletal and Integument   Osteopenia    Ref for dexa  No fractures Disc need for calcium/ vitamin D/ wt bearing exercise and bone density test every 2 y to monitor Disc safety/ fracture risk in detail           Other   Encounter for Medicare annual wellness exam - Primary    Reviewed health habits including diet and exercise and skin cancer prevention Reviewed appropriate screening tests for age  Also reviewed health mt list, fam hx and immunization status , as well as social and family history   See HPI Labs rev Make appointment with Dr Lorelei Pont about your hip Stop at check out for referral for bone density test  Try to get 1200-1500 mg of calcium per day with at least 1000 iu of vitamin D - for  bone health  Please work on an advance directive as we discussed  Pt declines some immunizations      Estrogen deficiency   Relevant Orders   DG Bone Density   Hyperlipidemia    Disc goals for lipids and reasons to control them Rev labs with pt Rev low sat fat diet in detail HDL down slt -enc exercise and omega 3 suppl  LDL improved       Routine general medical examination at a health care facility    Reviewed health habits including diet and exercise and skin cancer prevention Reviewed appropriate screening tests for age  Also reviewed health mt list, fam hx and immunization status , as well as social and family history   See HPI Labs rev Make appointment with Dr Lorelei Pont about your hip Stop at check out for referral for bone density test  Try to get 1200-1500 mg of calcium per day with at least 1000 iu of vitamin D - for bone health  Please work on an advance directive as we discussed  Pt declines some immunizations

## 2014-12-02 NOTE — Assessment & Plan Note (Signed)
Disc goals for lipids and reasons to control them Rev labs with pt Rev low sat fat diet in detail HDL down slt -enc exercise and omega 3 suppl  LDL improved

## 2014-12-02 NOTE — Assessment & Plan Note (Signed)
Reviewed health habits including diet and exercise and skin cancer prevention Reviewed appropriate screening tests for age  Also reviewed health mt list, fam hx and immunization status , as well as social and family history   See HPI Labs rev Make appointment with Dr Lorelei Pont about your hip Stop at check out for referral for bone density test  Try to get 1200-1500 mg of calcium per day with at least 1000 iu of vitamin D - for bone health  Please work on an advance directive as we discussed  Pt declines some immunizations

## 2014-12-02 NOTE — Assessment & Plan Note (Signed)
Ref for dexa  No fractures Disc need for calcium/ vitamin D/ wt bearing exercise and bone density test every 2 y to monitor Disc safety/ fracture risk in detail

## 2014-12-06 ENCOUNTER — Ambulatory Visit: Payer: Medicare Other | Admitting: Family Medicine

## 2014-12-20 ENCOUNTER — Ambulatory Visit (INDEPENDENT_AMBULATORY_CARE_PROVIDER_SITE_OTHER): Payer: Medicare Other | Admitting: Family Medicine

## 2014-12-20 ENCOUNTER — Encounter: Payer: Self-pay | Admitting: Family Medicine

## 2014-12-20 VITALS — BP 116/60 | HR 78 | Temp 98.3°F | Ht 63.25 in | Wt 138.5 lb

## 2014-12-20 DIAGNOSIS — M7061 Trochanteric bursitis, right hip: Secondary | ICD-10-CM | POA: Diagnosis not present

## 2014-12-20 NOTE — Progress Notes (Signed)
Pre visit review using our clinic review tool, if applicable. No additional management support is needed unless otherwise documented below in the visit note. 

## 2014-12-20 NOTE — Progress Notes (Signed)
Dr. Frederico Hamman T. Onelia Cadmus, MD, [facility] [place], [address] Phone: [phone] Fax: [phone]  12/20/2014  Patient: Gina Williams, MRN: [id number], DOB: [birth year], 69 y.o.  Primary Physician:  Loura Pardon, MD   Chief Complaint  Patient presents with  . Hip Pain    Right   Subjective:   Gina Williams is a 69 y.o. very pleasant female patient who presents with the following:  R hip, normally does not have issues and will have pain with lateral hip pain. 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 Does a lot of yarwork. No groin pain. No back pain. No trauma.   In the last 6 months. Gotten better in the last month.   Past Medical History, Surgical History, Social History, Family History, Problem List, Medications, and Allergies have been reviewed and updated if relevant.  Patient Active Problem List   Diagnosis Date Noted  . Routine general medical examination at a health care facility 12/01/2014  . Estrogen deficiency 12/01/2014  . Osteopenia 11/28/2013  . Colon cancer screening 11/03/2012  . Hyperlipidemia 11/03/2012  . Encounter for Medicare annual wellness exam 10/26/2012  . GERD 01/23/2010  . NEOPLASM, MALIGNANT, BREAST, HX OF 01/23/2010    Past Medical History  Diagnosis Date  . Osteopenia     In the past, now nl. BMD  . Breast cancer ([place]) 09/16/2007    Mastectomy  . Vertigo 2007  . GERD (gastroesophageal reflux disease)     Past Surgical History  Procedure Laterality Date  . Mastectomy  8/09    Breast Cancer  . Stress myoview  12/05    Low risk  . Nuclear stress test      Negative  EF 77%  . Hand surgery Left 10/2005    Nerve damage  . Ganglion cyst excision Left 1970s    Multiple surgeries  . Tonsillectomy  Late 1970s  . Hand surgery Left 2011    Torn ligament surgery  . Shoulder surgery Bilateral Late 1980s    Frozen shoulders bilaterally    Social History    Social History  . Marital Status: Single    Spouse Name: N/A  . Number of Children: N/A  . Years of Education: N/A   Occupational History  . Not on file.   Social History Main Topics  . Smoking status: Former Smoker    Quit date: 05/21/1998  . Smokeless tobacco: Never Used  . Alcohol Use: No  . Drug Use: No  . Sexual Activity: No   Other Topics Concern  . Not on file   Social History Narrative    Family History  Problem Relation Age of Onset  . Asthma Mother   . Aortic aneurysm Mother   . Stroke Maternal Uncle   . Hypertension Other     HTN on Father's side of family  . Stroke Father   . Alzheimer's disease Father   . Cancer Paternal Aunt     Breast Cancer  . Cancer Paternal Aunt     Breast Cancer  . Cancer Paternal Aunt     Breast Cancer    Allergies  Allergen Reactions  . Amoxicillin-Pot Clavulanate     REACTION: nausea (tolerates amoxil)  . Omeprazole     ineffective     Medication list reviewed and updated in full in [place].  GEN: No fevers, chills. Nontoxic. Primarily MSK c/o today. MSK: Detailed in the  HPI GI: tolerating PO intake without difficulty Neuro: No numbness, parasthesias, or tingling associated. Otherwise the pertinent positives of the ROS are noted above.   Objective:   BP 116/60 mmHg  Pulse 78  Temp(Src) 98.3 F (36.8 C) (Oral)  Ht 5' 3.25" (1.607 m)  Wt 138 lb 8 oz (62.823 kg)  BMI 24.33 kg/m2   GEN: WDWN, NAD, Non-toxic, Alert & Oriented x 3 HEENT: Atraumatic, Normocephalic.  Ears and Nose: No external deformity. EXTR: No clubbing/cyanosis/edema NEURO: Normal gait.  PSYCH: Normally interactive. Conversant. Not depressed or anxious appearing.  Calm demeanor.   HIP EXAM: SIDE: R ROM: Abduction, Flexion, Internal and External range of motion: full Pain with terminal IROM and EROM: minimal GTB: TTP on R SLR: NEG Knees: No effusion FABER: NT REVERSE FABER: NT, neg Piriformis: NT at direct palpation Str:  flexion: 5/5 abduction: 5/5 adduction: 5/5 Strength testing non-tender     Radiology: No results found.  Assessment and Plan:   Greater trochanteric bursitis, right  Basic rehab for now  A rehabilitation program from the [employer] was reviewed with the patient face to face for their condition.   Follow-up: No Follow-up on file.  Signed,  Maud Deed. Geraldin Habermehl, MD   Patient's Medications  New Prescriptions   No medications on file  Previous Medications   ACETAMINOPHEN (TYLENOL) 325 MG TABLET    Take 650 mg by mouth every 6 (six) hours as needed.   NEXIUM 40 MG CAPSULE    TAKE ONE (1) CAPSULE BY MOUTH EACH DAY BEFORE BREAKFAST   OMEGA-3 FATTY ACIDS (FISH OIL PO)    Take 1 capsule by mouth daily.  Modified Medications   No medications on file  Discontinued Medications   No medications on file

## 2014-12-26 ENCOUNTER — Other Ambulatory Visit: Payer: Self-pay

## 2014-12-26 MED ORDER — NEXIUM 40 MG PO CPDR: DELAYED_RELEASE_CAPSULE | ORAL | Status: DC

## 2014-12-26 NOTE — Telephone Encounter (Signed)
Pt request DAW Nexium sent to [facility] Advised pt done. Annual exam on 12/01/14.

## 2015-01-08 ENCOUNTER — Other Ambulatory Visit: Payer: Medicare Other

## 2015-02-02 ENCOUNTER — Ambulatory Visit
Admission: RE | Admit: 2015-02-02 | Discharge: 2015-02-02 | Disposition: A | Discharge status: Home / Self Care | Payer: Medicare Other | Source: Ambulatory Visit | Attending: Family Medicine | Admitting: Family Medicine

## 2015-02-02 DIAGNOSIS — M8589 Other specified disorders of bone density and structure, multiple sites: Secondary | ICD-10-CM | POA: Diagnosis not present

## 2015-02-02 DIAGNOSIS — E2839 Other primary ovarian failure: Secondary | ICD-10-CM

## 2015-02-02 LAB — HM DEXA SCAN

## 2015-02-06 ENCOUNTER — Encounter: Payer: Self-pay | Admitting: *Deleted

## 2015-02-06 ENCOUNTER — Encounter: Payer: Self-pay | Admitting: Family Medicine

## 2015-06-19 DIAGNOSIS — H00015 Hordeolum externum left lower eyelid: Secondary | ICD-10-CM | POA: Diagnosis not present

## 2015-08-09 ENCOUNTER — Other Ambulatory Visit: Payer: Self-pay | Admitting: Family Medicine

## 2015-08-09 DIAGNOSIS — Z1231 Encounter for screening mammogram for malignant neoplasm of breast: Secondary | ICD-10-CM

## 2015-09-17 ENCOUNTER — Ambulatory Visit
Admission: RE | Admit: 2015-09-17 | Discharge: 2015-09-17 | Disposition: A | Discharge status: Home / Self Care | Payer: Medicare Other | Source: Ambulatory Visit | Attending: Family Medicine | Admitting: Family Medicine

## 2015-09-17 DIAGNOSIS — Z1231 Encounter for screening mammogram for malignant neoplasm of breast: Secondary | ICD-10-CM | POA: Diagnosis not present

## 2015-09-17 LAB — HM MAMMOGRAPHY

## 2015-09-18 ENCOUNTER — Encounter: Payer: Self-pay | Admitting: *Deleted

## 2015-09-21 ENCOUNTER — Ambulatory Visit: Payer: Medicare Other | Admitting: Family Medicine

## 2015-11-27 DIAGNOSIS — N8111 Cystocele, midline: Secondary | ICD-10-CM | POA: Diagnosis not present

## 2015-11-27 DIAGNOSIS — N952 Postmenopausal atrophic vaginitis: Secondary | ICD-10-CM | POA: Diagnosis not present

## 2015-12-12 ENCOUNTER — Telehealth: Payer: Self-pay | Admitting: Family Medicine

## 2015-12-12 NOTE — Telephone Encounter (Signed)
LVM for pt to call back and schedule AWV + labs with Lesia and OV 30 with PCP. °

## 2015-12-19 DIAGNOSIS — N8111 Cystocele, midline: Secondary | ICD-10-CM | POA: Diagnosis not present

## 2015-12-19 HISTORY — PX: OTHER SURGICAL HISTORY: SHX169

## 2015-12-23 ENCOUNTER — Telehealth: Payer: Self-pay | Admitting: Family Medicine

## 2015-12-23 DIAGNOSIS — Z1159 Encounter for screening for other viral diseases: Secondary | ICD-10-CM | POA: Insufficient documentation

## 2015-12-23 DIAGNOSIS — K219 Gastro-esophageal reflux disease without esophagitis: Secondary | ICD-10-CM

## 2015-12-23 DIAGNOSIS — E7800 Pure hypercholesterolemia, unspecified: Secondary | ICD-10-CM

## 2015-12-23 NOTE — Telephone Encounter (Signed)
-----   Message from Eustace Pen, LPN sent at [phone]  1:52 PM EST ----- Regarding: Lab Orders 12/6 Please add Hep C to lab orders, if any. Thank you.

## 2015-12-26 ENCOUNTER — Ambulatory Visit (INDEPENDENT_AMBULATORY_CARE_PROVIDER_SITE_OTHER): Payer: Medicare Other

## 2015-12-26 VITALS — BP 106/62 | HR 79 | Temp 98.4°F | Ht 63.0 in | Wt 139.5 lb

## 2015-12-26 DIAGNOSIS — E7800 Pure hypercholesterolemia, unspecified: Secondary | ICD-10-CM

## 2015-12-26 DIAGNOSIS — Z1159 Encounter for screening for other viral diseases: Secondary | ICD-10-CM

## 2015-12-26 DIAGNOSIS — K219 Gastro-esophageal reflux disease without esophagitis: Secondary | ICD-10-CM

## 2015-12-26 DIAGNOSIS — Z0000 Encounter for general adult medical examination without abnormal findings: Secondary | ICD-10-CM | POA: Diagnosis not present

## 2015-12-26 LAB — LIPID PANEL
Cholesterol: 190 mg/dL (ref 0–200)
HDL: 51 mg/dL (ref >39.00)
LDL CALC: 118 mg/dL — AB (ref 0–99)
NONHDL: 138.83
Total CHOL/HDL Ratio: 4
Triglycerides: 102 mg/dL (ref 0.0–149.0)
VLDL: 20.4 mg/dL (ref 0.0–40.0)

## 2015-12-26 LAB — CBC WITH DIFFERENTIAL/PLATELET
BASOS PCT: 0.4 % (ref 0.0–3.0)
Basophils Absolute: 0 10*3/uL (ref 0.0–0.1)
EOS PCT: 1.6 % (ref 0.0–5.0)
Eosinophils Absolute: 0.2 10*3/uL (ref 0.0–0.7)
HCT: 41.5 % (ref 36.0–46.0)
Hemoglobin: 13.9 g/dL (ref 12.0–15.0)
LYMPHS ABS: 2.2 10*3/uL (ref 0.7–4.0)
Lymphocytes Relative: 22.5 % (ref 12.0–46.0)
MCHC: 33.6 g/dL (ref 30.0–36.0)
MCV: 88.4 fl (ref 78.0–100.0)
MONO ABS: 0.8 10*3/uL (ref 0.1–1.0)
Monocytes Relative: 8.4 % (ref 3.0–12.0)
NEUTROS PCT: 67.1 % (ref 43.0–77.0)
Neutro Abs: 6.6 10*3/uL (ref 1.4–7.7)
PLATELETS: 281 10*3/uL (ref 150.0–400.0)
RBC: 4.7 Mil/uL (ref 3.87–5.11)
RDW: 12.8 % (ref 11.5–15.5)
WBC: 9.8 10*3/uL (ref 4.0–10.5)

## 2015-12-26 LAB — COMPREHENSIVE METABOLIC PANEL
ALT: 14 U/L (ref 0–35)
AST: 16 U/L (ref 0–37)
Albumin: 4.2 g/dL (ref 3.5–5.2)
Alkaline Phosphatase: 63 U/L (ref 39–117)
BUN: 19 mg/dL (ref 6–23)
CHLORIDE: 105 meq/L (ref 96–112)
CO2: 27 meq/L (ref 19–32)
Calcium: 9.5 mg/dL (ref 8.4–10.5)
Creatinine, Ser: 0.9 mg/dL (ref 0.40–1.20)
GFR: 65.58 mL/min (ref >60.00)
GLUCOSE: 85 mg/dL (ref 70–99)
POTASSIUM: 5.1 meq/L (ref 3.5–5.1)
SODIUM: 139 meq/L (ref 135–145)
Total Bilirubin: 0.5 mg/dL (ref 0.2–1.2)
Total Protein: 7 g/dL (ref 6.0–8.3)

## 2015-12-26 LAB — TSH: TSH: 1.5 u[IU]/mL (ref 0.35–4.50)

## 2015-12-26 NOTE — Progress Notes (Signed)
Subjective:   Gina Williams is a 70 y.o. female who presents for Medicare Annual (Subsequent) preventive examination.  Review of Systems:  N/A Cardiac Risk Factors include: advanced age (>33men, >40 women);dyslipidemia     Objective:     Vitals: BP 106/62 (BP Location: Left Arm, Patient Position: Sitting, Cuff Size: Normal)   Pulse 79   Temp 98.4 F (36.9 C) (Oral)   Ht 5\' 3"  (1.6 m) Comment: no shoes  Wt 139 lb 8 oz (63.3 kg)   SpO2 96%   BMI 24.71 kg/m   Body mass index is 24.71 kg/m.   Tobacco History  Smoking Status  . Former Smoker  . Quit date: 05/21/1998  Smokeless Tobacco  . Never Used     Counseling given: No   Past Medical History:  Diagnosis Date  . Breast cancer ([place]) 09/16/2007   Mastectomy  . GERD (gastroesophageal reflux disease)   . Osteopenia    In the past, now nl. BMD  . Vertigo 2007   Past Surgical History:  Procedure Laterality Date  . GANGLION CYST EXCISION Left 1970s   Multiple surgeries  . HAND SURGERY Left 10/2005   Nerve damage  . HAND SURGERY Left 2011   Torn ligament surgery  . MASTECTOMY  8/09   Breast Cancer  . Nuclear Stress Test     Negative  EF 77%  . pap  08/05/2014   [facility]  . pessary  12/19/2015   [facility] Dr. Pamala Hurry  . SHOULDER SURGERY Bilateral Late 1980s   Frozen shoulders bilaterally  . Stress myoview  12/05   Low risk  . TONSILLECTOMY  Late 39s   Family History  Problem Relation Age of Onset  . Asthma Mother   . Aortic aneurysm Mother   . Stroke Father   . Alzheimer's disease Father   . Cancer Paternal Aunt     Breast Cancer  . Cancer Paternal Aunt     Breast Cancer  . Cancer Paternal Aunt     Breast Cancer  . Stroke Maternal Uncle   . Hypertension Other     HTN on Father's side of family   History  Sexual Activity  . Sexual activity: No    Outpatient Encounter Prescriptions as of 12/26/2015  Medication Sig  . acetaminophen (TYLENOL) 325 MG tablet Take 650 mg by mouth  every 6 (six) hours as needed.  [name] Kitchen NEXIUM 40 MG capsule TAKE ONE (1) CAPSULE BY MOUTH EACH DAY BEFORE BREAKFAST  . Omega-3 Fatty Acids (FISH OIL PO) Take 1 capsule by mouth daily.  . ranitidine (ZANTAC) 150 MG tablet Take 150 mg by mouth at bedtime as needed for heartburn.   No facility-administered encounter medications on file as of 12/26/2015.     Activities of Daily Living In your present state of health, do you have any difficulty performing the following activities: 12/26/2015  Hearing? Y  Vision? N  Difficulty concentrating or making decisions? N  Walking or climbing stairs? N  Dressing or bathing? N  Doing errands, shopping? N  Preparing Food and eating ? N  Using the Toilet? N  In the past six months, have you accidently leaked urine? Y  Do you have problems with loss of bowel control? N  Managing your Medications? N  Managing your Finances? N  Housekeeping or managing your Housekeeping? N  Some recent data might be hidden    Patient Care Team: Abner Greenspan, MD as PCP - General Marygrace Drought, MD  as Consulting Physician (Ophthalmology) Aloha Gell, MD as Consulting Physician (Obstetrics and Gynecology) Eula Listen, DDS as Referring Physician (Dentistry)    Assessment:     Hearing Screening   125Hz  250Hz  500Hz  1000Hz  2000Hz  3000Hz  4000Hz  6000Hz  8000Hz   Right ear:   40 40 40  40    Left ear:   40 40 40  40    Vision Screening Comments: Last vision exam in May 2017 with Dr. Satira Sark   Exercise Activities and Dietary recommendations Current Exercise Habits: Home exercise routine, Type of exercise: walking;Other - see comments (yard work, split and haul wood), Time (Minutes): > 60, Frequency (Times/Week): 7, Weekly Exercise (Minutes/Week): 0, Intensity: Moderate, Exercise limited by: None identified  Goals    . Increase physical activity          Starting 12/26/2015, I will continue to do yard work and walk at least 60 min daily as weather permits.       Fall  Risk Fall Risk  12/26/2015 12/01/2014 11/28/2013 11/03/2012  Falls in the past year? No No No No   Depression Screen PHQ 2/9 Scores 12/26/2015 12/01/2014 11/28/2013 11/03/2012  PHQ - 2 Score 0 0 0 0     Cognitive Function MMSE - Mini Mental State Exam 12/26/2015  Orientation to time 5  Orientation to Place 5  Registration 3  Attention/ Calculation 0  Recall 3  Language- name 2 objects 0  Language- repeat 1  Language- follow 3 step command 3  Language- read & follow direction 0  Write a sentence 0  Copy design 0  Total score 20       PLEASE NOTE: A Mini-Cog screen was completed. Maximum score is 20. A value of 0 denotes this part of [place] MMSE was not completed or the patient failed this part of the Mini-Cog screening.   Mini-Cog Screening Orientation to Time - Max 5 pts Orientation to Place - Max 5 pts Registration - Max 3 pts Recall - Max 3 pts Language Repeat - Max 1 pts Language Follow 3 Step Command - Max 3 pts   Immunization History  Administered Date(s) Administered  . Influenza,inj,Quad PF,36+ Mos 11/18/2012  . Td 06/29/2012   Screening Tests Health Maintenance  Topic Date Due  . INFLUENZA VACCINE  02/22/2020 (Originally 08/21/2015)  . ZOSTAVAX  02/22/2020 (Originally 11/13/2004)  . PNA vac Low Risk Adult (1 of 2 - PCV13) 12/25/2025 (Originally 11/13/2009)  . MAMMOGRAM  09/16/2016  . TETANUS/TDAP  06/30/2022  . COLONOSCOPY  02/01/2023  . DEXA SCAN  Completed  . Hepatitis C Screening  Completed      Plan:     I have personally reviewed and addressed the Medicare Annual Wellness questionnaire and have noted the following in the patient's chart:  A. Medical and social history B. Use of alcohol, tobacco or illicit drugs  C. Current medications and supplements D. Functional ability and status E.  Nutritional status F.  Physical activity G. Advance directives H. List of other physicians I.  Hospitalizations, surgeries, and ER visits in previous 12  months J.  [place] to include hearing, vision, cognitive, depression L. Referrals and appointments - none  In addition, I have reviewed and discussed with patient certain preventive protocols, quality metrics, and best practice recommendations. A written personalized care plan for preventive services as well as general preventive health recommendations were provided to patient.  See attached scanned questionnaire for additional information.   Signed,   Lindell Noe, MHA, BS, LPN  Health Coach

## 2015-12-26 NOTE — Progress Notes (Signed)
PCP notes:   Health maintenance:  Hep C screening - completed PCV13 - declined  Abnormal screenings:   None  Patient concerns:   None  Nurse concerns:  None  Next PCP appt:   01/01/16 @ 0900  I reviewed health advisor's note, was available for consultation, and agree with documentation and plan. Loura Pardon MD

## 2015-12-26 NOTE — Patient Instructions (Signed)
Gina Williams , Thank you for taking time to come for your Medicare Wellness Visit. I appreciate your ongoing commitment to your health goals. Please review the following plan we discussed and let me know if I can assist you in the future.   These are the goals we discussed: Goals    . Increase physical activity          Starting 12/26/2015, I will continue to do yard work and walk at least 60 min daily as weather permits.        This is a list of the screening recommended for you and due dates:  Health Maintenance  Topic Date Due  . Flu Shot  02/22/2020*  . Shingles Vaccine  02/22/2020*  . Pneumonia vaccines (1 of 2 - PCV13) 12/25/2025*  . Mammogram  09/16/2016  . Tetanus Vaccine  06/30/2022  . Colon Cancer Screening  02/01/2023  . DEXA scan (bone density measurement)  Completed  .  Hepatitis C: One time screening is recommended by Center for Disease Control  (CDC) for  adults born from [birth year] through 1966.   Completed  *Topic was postponed. The date shown is not the original due date.   Preventive Care for Adults  A healthy lifestyle and preventive care can promote health and wellness. Preventive health guidelines for adults include the following key practices.  . A routine yearly physical is a good way to check with your health care provider about your health and preventive screening. It is a chance to share any concerns and updates on your health and to receive a thorough exam.  . Visit your dentist for a routine exam and preventive care every 6 months. Brush your teeth twice a day and floss once a day. Good oral hygiene prevents tooth decay and gum disease.  . The frequency of eye exams is based on your age, health, family medical history, use  of contact lenses, and other factors. Follow your health care provider's ecommendations for frequency of eye exams.  . Eat a healthy diet. Foods like vegetables, fruits, whole grains, low-fat dairy products, and lean protein foods contain  the nutrients you need without too many calories. Decrease your intake of foods high in solid fats, added sugars, and salt. Eat the right amount of calories for you. Get information about a proper diet from your health care provider, if necessary.  . Regular physical exercise is one of the most important things you can do for your health. Most adults should get at least 150 minutes of moderate-intensity exercise (any activity that increases your heart rate and causes you to sweat) each week. In addition, most adults need muscle-strengthening exercises on 2 or more days a week.  Silver Sneakers may be a benefit available to you. To determine eligibility, you may visit the website: www.silversneakers.com or contact program at [phone] Mon-Fri between 8AM-8PM.   . Maintain a healthy weight. The body mass index (BMI) is a screening tool to identify possible weight problems. It provides an estimate of body fat based on height and weight. Your health care provider can find your BMI and can help you achieve or maintain a healthy weight.   For adults 20 years and older: ? A BMI below 18.5 is considered underweight. ? A BMI of 18.5 to 24.9 is normal. ? A BMI of 25 to 29.9 is considered overweight. ? A BMI of 30 and above is considered obese.   . Maintain normal blood lipids and cholesterol levels by exercising and  minimizing your intake of saturated fat. Eat a balanced diet with plenty of fruit and vegetables. Blood tests for lipids and cholesterol should begin at age 32 and be repeated every 5 years. If your lipid or cholesterol levels are high, you are over 50, or you are at high risk for heart disease, you may need your cholesterol levels checked more frequently. Ongoing high lipid and cholesterol levels should be treated with medicines if diet and exercise are not working.  . If you smoke, find out from your health care provider how to quit. If you do not use tobacco, please do not start.  . If  you choose to drink alcohol, please do not consume more than 2 drinks per day. One drink is considered to be 12 ounces (355 mL) of beer, 5 ounces (148 mL) of wine, or 1.5 ounces (44 mL) of liquor.  . If you are 43-76 years old, ask your health care provider if you should take aspirin to prevent strokes.  . Use sunscreen. Apply sunscreen liberally and repeatedly throughout the day. You should seek shade when your shadow is shorter than you. Protect yourself by wearing long sleeves, pants, a wide-brimmed hat, and sunglasses year round, whenever you are outdoors.  . Once a month, do a whole body skin exam, using a mirror to look at the skin on your back. Tell your health care provider of new moles, moles that have irregular borders, moles that are larger than a pencil eraser, or moles that have changed in shape or color.

## 2015-12-26 NOTE — Progress Notes (Signed)
Pre visit review using our clinic review tool, if applicable. No additional management support is needed unless otherwise documented below in the visit note. 

## 2015-12-27 LAB — HEPATITIS C ANTIBODY: HCV AB: NEGATIVE

## 2016-01-01 ENCOUNTER — Encounter: Payer: Self-pay | Admitting: Family Medicine

## 2016-01-01 ENCOUNTER — Ambulatory Visit (INDEPENDENT_AMBULATORY_CARE_PROVIDER_SITE_OTHER): Payer: Medicare Other | Admitting: Family Medicine

## 2016-01-01 VITALS — BP 118/58 | HR 79 | Temp 97.8°F | Ht 63.0 in | Wt 139.2 lb

## 2016-01-01 DIAGNOSIS — M8589 Other specified disorders of bone density and structure, multiple sites: Secondary | ICD-10-CM | POA: Diagnosis not present

## 2016-01-01 DIAGNOSIS — Z1159 Encounter for screening for other viral diseases: Secondary | ICD-10-CM | POA: Diagnosis not present

## 2016-01-01 DIAGNOSIS — Z853 Personal history of malignant neoplasm of breast: Secondary | ICD-10-CM | POA: Diagnosis not present

## 2016-01-01 DIAGNOSIS — E7800 Pure hypercholesterolemia, unspecified: Secondary | ICD-10-CM | POA: Diagnosis not present

## 2016-01-01 MED ORDER — NEXIUM 40 MG PO CPDR: DELAYED_RELEASE_CAPSULE | ORAL | 11 refills | Status: DC

## 2016-01-01 NOTE — Progress Notes (Signed)
Subjective:    Patient ID: Gina Williams, female    DOB: Nov 22, 1945, 70 y.o.   MRN: [id number]  HPI Here for annual f/u of chronic medical problems   Had a pessary put in a few weeks ago  Is doing very well with it - getting used to it   Had AMW on 12/6  Wt Readings from Last 3 Encounters:  01/01/16 139 lb 4 oz (63.2 kg)  12/26/15 139 lb 8 oz (63.3 kg)  12/20/14 138 lb 8 oz (62.8 kg)  bmi is 24.6 Working a lot outdoors/ gets her exercise  Eats a healthy diet also  Makes sure to get protein with her meals  occ candy   Had hep C screening -negative   Declined PCV 13  Declines most immunizations  Mammogram 8/17-neg Personal hx of breast cancer with mastectomy (released from the cancer center) Self breast exam-no new lumps   Tetanus shot 6/14  colnonscopy 1/15- 10 y recall Would consider cologuard at 5 year mark No problems   dexa 1/17-stable osteopenia  No falls No fractures  Not taking her ca and D   Mother had AAA She does not think her medicare provider would pay for an [place] but she will check  No symptoms   Hx of hyperlipidemia Lab Results  Component Value Date   CHOL 190 12/26/2015   CHOL 170 11/24/2014   CHOL 196 11/14/2013   Lab Results  Component Value Date   HDL 51.00 12/26/2015   HDL 45.20 11/24/2014   HDL 50.90 11/14/2013   Lab Results  Component Value Date   LDLCALC 118 (H) 12/26/2015   LDLCALC 108 (H) 11/24/2014   LDLCALC 129 (H) 11/14/2013   Lab Results  Component Value Date   TRIG 102.0 12/26/2015   TRIG 81.0 11/24/2014   TRIG 83.0 11/14/2013   Lab Results  Component Value Date   CHOLHDL 4 12/26/2015   CHOLHDL 4 11/24/2014   CHOLHDL 4 11/14/2013   Lab Results  Component Value Date   LDLDIRECT 156.2 10/27/2012  has brought LDL down over the years (but up a bit today) HDL is good  Eats well   Results for orders placed or performed in visit on 12/26/15  CBC with Differential/Platelet  Result Value Ref Range   WBC 9.8  4.0 - 10.5 K/uL   RBC 4.70 3.87 - 5.11 Mil/uL   Hemoglobin 13.9 12.0 - 15.0 g/dL   HCT 41.5 36.0 - 46.0 %   MCV 88.4 78.0 - 100.0 fl   MCHC 33.6 30.0 - 36.0 g/dL   RDW 12.8 11.5 - 15.5 %   Platelets 281.0 150.0 - 400.0 K/uL   Neutrophils Relative % 67.1 43.0 - 77.0 %   Lymphocytes Relative 22.5 12.0 - 46.0 %   Monocytes Relative 8.4 3.0 - 12.0 %   Eosinophils Relative 1.6 0.0 - 5.0 %   Basophils Relative 0.4 0.0 - 3.0 %   Neutro Abs 6.6 1.4 - 7.7 K/uL   Lymphs Abs 2.2 0.7 - 4.0 K/uL   Monocytes Absolute 0.8 0.1 - 1.0 K/uL   Eosinophils Absolute 0.2 0.0 - 0.7 K/uL   Basophils Absolute 0.0 0.0 - 0.1 K/uL  Comprehensive metabolic panel  Result Value Ref Range   Sodium 139 135 - 145 mEq/L   Potassium 5.1 3.5 - 5.1 mEq/L   Chloride 105 96 - 112 mEq/L   CO2 27 19 - 32 mEq/L   Glucose, Bld 85 70 - 99 mg/dL  BUN 19 6 - 23 mg/dL   Creatinine, Ser 0.90 0.40 - 1.20 mg/dL   Total Bilirubin 0.5 0.2 - 1.2 mg/dL   Alkaline Phosphatase 63 39 - 117 U/L   AST 16 0 - 37 U/L   ALT 14 0 - 35 U/L   Total Protein 7.0 6.0 - 8.3 g/dL   Albumin 4.2 3.5 - 5.2 g/dL   Calcium 9.5 8.4 - 10.5 mg/dL   GFR 65.58 >60.00 mL/min  Hepatitis C antibody  Result Value Ref Range   HCV Ab NEGATIVE NEGATIVE  Lipid panel  Result Value Ref Range   Cholesterol 190 0 - 200 mg/dL   Triglycerides 102.0 0.0 - 149.0 mg/dL   HDL 51.00 >39.00 mg/dL   VLDL 20.4 0.0 - 40.0 mg/dL   LDL Cholesterol 118 (H) 0 - 99 mg/dL   Total CHOL/HDL Ratio 4    NonHDL 138.83   TSH  Result Value Ref Range   TSH 1.50 0.35 - 4.50 uIU/mL    Patient Active Problem List   Diagnosis Date Noted  . Need for hepatitis C screening test 12/23/2015  . Routine general medical examination at a health care facility 12/01/2014  . Estrogen deficiency 12/01/2014  . Osteopenia 11/28/2013  . Colon cancer screening 11/03/2012  . Hyperlipidemia 11/03/2012  . Encounter for Medicare annual wellness exam 10/26/2012  . GERD 01/23/2010  . NEOPLASM,  MALIGNANT, BREAST, HX OF 01/23/2010   Past Medical History:  Diagnosis Date  . Breast cancer ([place]) 09/16/2007   Mastectomy  . GERD (gastroesophageal reflux disease)   . Osteopenia    In the past, now nl. BMD  . Vertigo 2007   Past Surgical History:  Procedure Laterality Date  . GANGLION CYST EXCISION Left 1970s   Multiple surgeries  . HAND SURGERY Left 10/2005   Nerve damage  . HAND SURGERY Left 2011   Torn ligament surgery  . MASTECTOMY  8/09   Breast Cancer  . Nuclear Stress Test     Negative  EF 77%  . pap  08/05/2014   [facility]  . pessary  12/19/2015   [facility] Dr. Pamala Hurry  . SHOULDER SURGERY Bilateral Late 1980s   Frozen shoulders bilaterally  . Stress myoview  12/05   Low risk  . TONSILLECTOMY  Late 60s   Social History  Substance Use Topics  . Smoking status: Former Smoker    Quit date: 05/21/1998  . Smokeless tobacco: Never Used  . Alcohol use No   Family History  Problem Relation Age of Onset  . Asthma Mother   . Aortic aneurysm Mother   . Stroke Father   . Alzheimer's disease Father   . Cancer Paternal Aunt     Breast Cancer  . Cancer Paternal Aunt     Breast Cancer  . Cancer Paternal Aunt     Breast Cancer  . Stroke Maternal Uncle   . Hypertension Other     HTN on Father's side of family   Allergies  Allergen Reactions  . Amoxicillin-Pot Clavulanate     REACTION: nausea (tolerates amoxil)  . Omeprazole     ineffective    Current Outpatient Prescriptions on File Prior to Visit  Medication Sig Dispense Refill  . acetaminophen (TYLENOL) 325 MG tablet Take 650 mg by mouth every 6 (six) hours as needed.    . Omega-3 Fatty Acids (FISH OIL PO) Take 1 capsule by mouth daily.    . ranitidine (ZANTAC) 150 MG tablet Take 150 mg by  mouth at bedtime as needed for heartburn.     No current facility-administered medications on file prior to visit.     Review of Systems Review of Systems  Constitutional: Negative for fever, appetite  change,  and unexpected weight change. pos for fatigue and lack of self care time from care giving  Eyes: Negative for pain and visual disturbance.  Respiratory: Negative for cough and shortness of breath.   Cardiovascular: Negative for cp or palpitations    Gastrointestinal: Negative for nausea, diarrhea and constipation.  Genitourinary: Negative for urgency and frequency. (pos for incontinence improved by pessary)  Skin: Negative for pallor or rash   Neurological: Negative for weakness, light-headedness, numbness and headaches.  Hematological: Negative for adenopathy. Does not bruise/bleed easily.  Psychiatric/Behavioral: Negative for dysphoric mood. The patient is not nervous/anxious.         Objective:   Physical Exam  Constitutional: She appears well-developed and well-nourished. No distress.  Well appearing  HENT:  Head: Normocephalic and atraumatic.  Right Ear: External ear normal.  Left Ear: External ear normal.  Mouth/Throat: Oropharynx is clear and moist.  Eyes: Conjunctivae and EOM are normal. Pupils are equal, round, and reactive to light. No scleral icterus.  Neck: Normal range of motion. Neck supple. No JVD present. Carotid bruit is not present. No thyromegaly present.  Cardiovascular: Normal rate, regular rhythm, normal heart sounds and intact distal pulses.  Exam reveals no gallop.   Pulmonary/Chest: Effort normal and breath sounds normal. No respiratory distress. She has no wheezes. She exhibits no tenderness.  Abdominal: Soft. Bowel sounds are normal. She exhibits no distension, no abdominal bruit and no mass. There is no tenderness.  Genitourinary: No breast swelling, tenderness, discharge or bleeding.  Genitourinary Comments: R mastectomy site is clear   L breast: No mass, nodules, thickening, tenderness, bulging, retraction, inflamation, nipple discharge or skin changes noted.  No axillary or clavicular LA.      Musculoskeletal: Normal range of motion. She exhibits  no edema or tenderness.  No kyphosis   Lymphadenopathy:    She has no cervical adenopathy.  Neurological: She is alert. She has normal reflexes. No cranial nerve deficit. She exhibits normal muscle tone. Coordination normal.  Skin: Skin is warm and dry. No rash noted. No erythema. No pallor.  Solar lentigines and few skin tags  Psychiatric: She has a normal mood and affect.          Assessment & Plan:   Problem List Items Addressed This Visit      Musculoskeletal and Integument   Osteopenia - Primary    Stable osteopenia 1/17 dexa with no falls or fractures Enc to continue ca and D Disc need for calcium/ vitamin D/ wt bearing exercise and bone density test every 2 y to monitor Disc safety/ fracture risk in detail          Other   Need for hepatitis C screening test    Negative screen Low risk      Hyperlipidemia    Disc goals for lipids and reasons to control them Rev labs with pt Rev low sat fat diet in detail LDL is up slightly with good HDL  Over time LDL has improved with good diet  On fish oil       History of breast cancer    With r mastectomy  Last mammogram was nl and she has been released by oncology  Nl exam  Enc self exems

## 2016-01-01 NOTE — Progress Notes (Signed)
Pre visit review using our clinic review tool, if applicable. No additional management support is needed unless otherwise documented below in the visit note. 

## 2016-01-01 NOTE — Patient Instructions (Addendum)
Try to get 1200-1500 mg of calcium per day with at least 1000 iu of vitamin D - for bone health  Since you have a family history of aortic aneurysm - please check in with your medicare provider to see if they would cover an ultrasound of your abdomen to screen for aneurysm    Take care of yourself  Stay healthy  Keep exercising !

## 2016-01-02 NOTE — Assessment & Plan Note (Signed)
With r mastectomy  Last mammogram was nl and she has been released by oncology  Nl exam  Enc self exems

## 2016-01-02 NOTE — Assessment & Plan Note (Signed)
Negative screen Low risk

## 2016-01-02 NOTE — Assessment & Plan Note (Signed)
Disc goals for lipids and reasons to control them Rev labs with pt Rev low sat fat diet in detail LDL is up slightly with good HDL  Over time LDL has improved with good diet  On fish oil

## 2016-01-02 NOTE — Assessment & Plan Note (Signed)
Stable osteopenia 1/17 dexa with no falls or fractures Enc to continue ca and D Disc need for calcium/ vitamin D/ wt bearing exercise and bone density test every 2 y to monitor Disc safety/ fracture risk in detail

## 2016-01-04 DIAGNOSIS — N8111 Cystocele, midline: Secondary | ICD-10-CM | POA: Diagnosis not present

## 2016-02-11 ENCOUNTER — Ambulatory Visit (INDEPENDENT_AMBULATORY_CARE_PROVIDER_SITE_OTHER)
Admission: RE | Admit: 2016-02-11 | Discharge: 2016-02-11 | Disposition: A | Discharge status: Home / Self Care | Payer: Medicare Other | Source: Ambulatory Visit | Attending: Family Medicine | Admitting: Family Medicine

## 2016-02-11 ENCOUNTER — Ambulatory Visit (INDEPENDENT_AMBULATORY_CARE_PROVIDER_SITE_OTHER): Payer: Medicare Other | Admitting: Family Medicine

## 2016-02-11 ENCOUNTER — Ambulatory Visit
Admission: RE | Admit: 2016-02-11 | Discharge: 2016-02-11 | Disposition: A | Discharge status: Home / Self Care | Payer: Medicare Other | Source: Ambulatory Visit | Attending: Family Medicine | Admitting: Family Medicine

## 2016-02-11 ENCOUNTER — Encounter: Payer: Self-pay | Admitting: Family Medicine

## 2016-02-11 VITALS — BP 120/60 | HR 80 | Temp 98.5°F | Ht 63.0 in | Wt 141.2 lb

## 2016-02-11 DIAGNOSIS — S83412A Sprain of medial collateral ligament of left knee, initial encounter: Secondary | ICD-10-CM | POA: Diagnosis not present

## 2016-02-11 DIAGNOSIS — S83411A Sprain of medial collateral ligament of right knee, initial encounter: Secondary | ICD-10-CM | POA: Diagnosis not present

## 2016-02-11 DIAGNOSIS — S8991XA Unspecified injury of right lower leg, initial encounter: Secondary | ICD-10-CM | POA: Diagnosis not present

## 2016-02-11 DIAGNOSIS — S8992XA Unspecified injury of left lower leg, initial encounter: Secondary | ICD-10-CM

## 2016-02-11 DIAGNOSIS — M25561 Pain in right knee: Secondary | ICD-10-CM | POA: Diagnosis not present

## 2016-02-11 DIAGNOSIS — M25562 Pain in left knee: Secondary | ICD-10-CM | POA: Diagnosis not present

## 2016-02-11 NOTE — Progress Notes (Signed)
Dr. Frederico Hamman T. Nelli Swalley, MD, [facility] [place], [address] Phone: [phone] Fax: [phone]  02/11/2016  Patient: Gina Williams, MRN: [id number], DOB: 1945-05-09, 71 y.o.  Primary Physician:  Loura Pardon, MD   Chief Complaint  Patient presents with  . Fall    Thursday afternoon  . Knee Pain    Bilateral but right is worse   Subjective:   Gina Williams is a 71 y.o. very pleasant female patient who presents with the following:  B knee pain, fall on the ice.   Date of injury: February 07, 2016  Both feet slipped on the ice, and twisted laterally and the patient felt to her  Bottom position.  She had to stay on the ice for approximately 8 minutes before she could get up and move away.  Step to the right and left, twisted - fell on bottom.  Stayed for a few minutes. 8 minutes.  No stairs right now - first 2 nights, Slept on the sofa.   She initially did have some swelling, more on the right, and this is gotten better since her initial injury. She is accompanied by her sister.  No prior major knee injuries or surgery.  Past Medical History, Surgical History, Social History, Family History, Problem List, Medications, and Allergies have been reviewed and updated if relevant.  Patient Active Problem List   Diagnosis Date Noted  . Need for hepatitis C screening test 12/23/2015  . Routine general medical examination at a health care facility 12/01/2014  . Estrogen deficiency 12/01/2014  . Osteopenia 11/28/2013  . Colon cancer screening 11/03/2012  . Hyperlipidemia 11/03/2012  . Encounter for Medicare annual wellness exam 10/26/2012  . GERD 01/23/2010  . History of breast cancer 01/23/2010    Past Medical History:  Diagnosis Date  . Breast cancer ([place]) 09/16/2007   Mastectomy  . GERD (gastroesophageal reflux disease)   . Osteopenia    In the past, now nl. BMD  . Vertigo 2007    Past Surgical  History:  Procedure Laterality Date  . GANGLION CYST EXCISION Left 1970s   Multiple surgeries  . HAND SURGERY Left 10/2005   Nerve damage  . HAND SURGERY Left 2011   Torn ligament surgery  . MASTECTOMY  8/09   Breast Cancer  . Nuclear Stress Test     Negative  EF 77%  . pap  08/05/2014   [facility]  . pessary  12/19/2015   [facility] Dr. Pamala Hurry  . SHOULDER SURGERY Bilateral Late 1980s   Frozen shoulders bilaterally  . Stress myoview  12/05   Low risk  . TONSILLECTOMY  Late 39s    Social History   Social History  . Marital status: Single    Spouse name: N/A  . Number of children: N/A  . Years of education: N/A   Occupational History  . Not on file.   Social History Main Topics  . Smoking status: Former Smoker    Quit date: 05/21/1998  . Smokeless tobacco: Never Used  . Alcohol use No  . Drug use: No  . Sexual activity: No   Other Topics Concern  . Not on file   Social History Narrative  . No narrative on file    Family History  Problem Relation Age of Onset  . Asthma Mother   . Aortic aneurysm Mother   . Stroke Father   . Alzheimer's disease Father   .  Cancer Paternal Aunt     Breast Cancer  . Cancer Paternal Aunt     Breast Cancer  . Cancer Paternal Aunt     Breast Cancer  . Stroke Maternal Uncle   . Hypertension Other     HTN on Father's side of family    Allergies  Allergen Reactions  . Amoxicillin-Pot Clavulanate     REACTION: nausea (tolerates amoxil)  . Omeprazole     ineffective     Medication list reviewed and updated in full in [place].  GEN: No fevers, chills. Nontoxic. Primarily MSK c/o today. MSK: Detailed in the HPI GI: tolerating PO intake without difficulty Neuro: No numbness, parasthesias, or tingling associated. Otherwise the pertinent positives of the ROS are noted above.   Objective:   BP 120/60   Pulse 80   Temp 98.5 F (36.9 C) (Oral)   Ht 5\' 3"  (1.6 m)   Wt 141 lb 4 oz (64.1 kg)   BMI  25.02 kg/m    GEN: WDWN, NAD, Non-toxic, Alert & Oriented x 3 HEENT: Atraumatic, Normocephalic.  Ears and Nose: No external deformity. EXTR: No clubbing/cyanosis/edema NEURO: notable antalgic gait. PSYCH: Normally interactive. Conversant. Not depressed or anxious appearing.  Calm demeanor.    Bilateral knee exam: Full extension.  Left side flexion to 130.  Right side flexion to 118. Significant tenderness on the medial joint line bilaterally.   Lateral joint line nontender.. Patellas are nontender on either side.  Nontender at the proximal tibia or fibula.  Valgus stress causes pain bilaterally. Varus stress causes no significant pain  Unable to complete special testing for meniscus secondary to pain.  ACL was intact.  PCL was intact.  Radiology: Dg Knee Complete 4 Views Left  Result Date: 02/11/2016 CLINICAL DATA:  Pain following fall EXAM: LEFT KNEE - COMPLETE 4+ VIEW COMPARISON:  None. FINDINGS: Frontal, lateral, bilateral oblique, and sunrise patellar images were obtained. There is no fracture or dislocation. No joint effusion. The joint spaces appear normal. There is a spur arising from the anterior superior patella. IMPRESSION: Spur arising from the anterior superior patella. No fracture or joint effusion. No appreciable joint space narrowing. Electronically Signed   By: Lowella Grip III M.D.   On: 02/11/2016 14:17   Dg Knee Complete 4 Views Right  Result Date: 02/11/2016 CLINICAL DATA:  Pain following fall EXAM: RIGHT KNEE - COMPLETE 4+ VIEW COMPARISON:  None. FINDINGS: Frontal, lateral, bilateral oblique, and sunrise patellar images were obtained. There is no fracture or dislocation. No joint effusion. Joint spaces appear normal. There is a bipartite patella, an anatomic variant. A spur arises from the superior patella anteriorly. IMPRESSION: Bipartite patella, an anatomic variant. Spur along the anterior superior patella. No appreciable joint space narrowing. No fracture  or joint effusion. Electronically Signed   By: Lowella Grip III M.D.   On: 02/11/2016 14:16     Assessment and Plan:   Sprain of medial collateral ligament of right knee, initial encounter  Injury of right knee, initial encounter - Plan: DG Knee Complete 4 Views Right  Injury of left knee, initial encounter - Plan: DG Knee Complete 4 Views Left  Sprain of medial collateral ligament of left knee, initial encounter  >25 minutes spent in face to face time with patient, >50% spent in counselling or coordination of care   Grade 1 sprain of the medial collateral ligament, bilaterally.  Slightly worse on the right.  Place the patient in hinged knee braces, patellar J  brace, bilaterally. Avoid activities that make her pain worse.   These should heal with time.  Anticipate 4-6 weeks.  Follow-up: 5-6 weeks if needed  Medications Discontinued During This Encounter  Medication Reason  . ranitidine (ZANTAC) 150 MG tablet Completed Course   Orders Placed This Encounter  Procedures  . DG Knee Complete 4 Views Left  . DG Knee Complete 4 Views Right    Signed,  Frederico Hamman T. Caffie Sotto, MD   Allergies as of 02/11/2016      Reactions   Amoxicillin-pot Clavulanate    REACTION: nausea (tolerates amoxil)   Omeprazole    ineffective      Medication List       Accurate as of 02/11/16 11:59 PM. Always use your most recent med list.          acetaminophen 325 MG tablet Commonly known as:  TYLENOL Take 650 mg by mouth every 6 (six) hours as needed.   FISH OIL PO Take 1 capsule by mouth daily.   NEXIUM 40 MG capsule Generic drug:  esomeprazole TAKE ONE (1) CAPSULE BY MOUTH EACH DAY BEFORE BREAKFAST

## 2016-02-11 NOTE — Progress Notes (Signed)
Pre visit review using our clinic review tool, if applicable. No additional management support is needed unless otherwise documented below in the visit note. 

## 2016-03-10 ENCOUNTER — Telehealth: Payer: Self-pay | Admitting: Family Medicine

## 2016-03-10 NOTE — Telephone Encounter (Signed)
I am going to forward this to Dr Lorelei Pont who last saw her for this

## 2016-03-10 NOTE — Telephone Encounter (Signed)
Pt called to request that we send her some strengthening exercise with pictures.  She said that she was in about a month ago for her knee and its still not feeling better.  She still cant go up and down steps.

## 2016-03-10 NOTE — Telephone Encounter (Signed)
Printed. In your box  F/u with me in 2 weeks if still having issues.

## 2016-03-11 NOTE — Telephone Encounter (Signed)
Knee conditioning handout mailed to patient as requested.

## 2016-04-02 DIAGNOSIS — N8111 Cystocele, midline: Secondary | ICD-10-CM | POA: Diagnosis not present

## 2016-04-02 DIAGNOSIS — N898 Other specified noninflammatory disorders of vagina: Secondary | ICD-10-CM | POA: Diagnosis not present

## 2016-07-02 DIAGNOSIS — N8111 Cystocele, midline: Secondary | ICD-10-CM | POA: Diagnosis not present

## 2016-07-02 DIAGNOSIS — N898 Other specified noninflammatory disorders of vagina: Secondary | ICD-10-CM | POA: Diagnosis not present

## 2016-07-02 DIAGNOSIS — N760 Acute vaginitis: Secondary | ICD-10-CM | POA: Diagnosis not present

## 2016-07-02 DIAGNOSIS — N814 Uterovaginal prolapse, unspecified: Secondary | ICD-10-CM | POA: Diagnosis not present

## 2016-07-17 DIAGNOSIS — N8111 Cystocele, midline: Secondary | ICD-10-CM | POA: Diagnosis not present

## 2016-07-17 DIAGNOSIS — N3941 Urge incontinence: Secondary | ICD-10-CM | POA: Diagnosis not present

## 2016-07-17 DIAGNOSIS — N898 Other specified noninflammatory disorders of vagina: Secondary | ICD-10-CM | POA: Diagnosis not present

## 2016-07-17 DIAGNOSIS — Z124 Encounter for screening for malignant neoplasm of cervix: Secondary | ICD-10-CM | POA: Diagnosis not present

## 2016-07-17 DIAGNOSIS — N39490 Overflow incontinence: Secondary | ICD-10-CM | POA: Diagnosis not present

## 2016-07-17 DIAGNOSIS — Z01419 Encounter for gynecological examination (general) (routine) without abnormal findings: Secondary | ICD-10-CM | POA: Diagnosis not present

## 2016-07-22 DIAGNOSIS — R339 Retention of urine, unspecified: Secondary | ICD-10-CM | POA: Diagnosis not present

## 2016-07-22 DIAGNOSIS — N3946 Mixed incontinence: Secondary | ICD-10-CM | POA: Diagnosis not present

## 2016-07-22 DIAGNOSIS — N8111 Cystocele, midline: Secondary | ICD-10-CM | POA: Diagnosis not present

## 2016-08-12 DIAGNOSIS — N952 Postmenopausal atrophic vaginitis: Secondary | ICD-10-CM | POA: Diagnosis not present

## 2016-08-12 DIAGNOSIS — R339 Retention of urine, unspecified: Secondary | ICD-10-CM | POA: Diagnosis not present

## 2016-08-12 DIAGNOSIS — N3946 Mixed incontinence: Secondary | ICD-10-CM | POA: Diagnosis not present

## 2016-08-12 DIAGNOSIS — N814 Uterovaginal prolapse, unspecified: Secondary | ICD-10-CM | POA: Diagnosis not present

## 2016-08-14 ENCOUNTER — Other Ambulatory Visit: Payer: Self-pay | Admitting: Family Medicine

## 2016-08-14 DIAGNOSIS — Z1231 Encounter for screening mammogram for malignant neoplasm of breast: Secondary | ICD-10-CM

## 2016-09-08 ENCOUNTER — Telehealth: Payer: Self-pay | Admitting: Family Medicine

## 2016-09-08 NOTE — Telephone Encounter (Signed)
Pt notified of Dr. Marliss Coots comments. Pt said GYN told her she does need an CXR, surgical clearance appt scheduled

## 2016-09-08 NOTE — Telephone Encounter (Signed)
Pt called regarding clearance for surgery 10/4. The medical clearance was faxed 8/14 by Dr Pamala Hurry. She needs to know if she needs a EKG and chest x-ray. She is requesting a cb.

## 2016-09-08 NOTE — Telephone Encounter (Signed)
Yes-we generally need an ekg (sometimes a cxr depending on her hx and if her gyn wants one) Please schedule a visit for pre op exam Thanks

## 2016-09-15 DIAGNOSIS — N83202 Unspecified ovarian cyst, left side: Secondary | ICD-10-CM | POA: Diagnosis not present

## 2016-09-15 DIAGNOSIS — N814 Uterovaginal prolapse, unspecified: Secondary | ICD-10-CM | POA: Diagnosis not present

## 2016-09-17 ENCOUNTER — Ambulatory Visit
Admission: RE | Admit: 2016-09-17 | Discharge: 2016-09-17 | Disposition: A | Discharge status: Home / Self Care | Payer: Medicare Other | Source: Ambulatory Visit | Attending: Family Medicine | Admitting: Family Medicine

## 2016-09-17 DIAGNOSIS — Z1231 Encounter for screening mammogram for malignant neoplasm of breast: Secondary | ICD-10-CM

## 2016-09-23 ENCOUNTER — Ambulatory Visit (INDEPENDENT_AMBULATORY_CARE_PROVIDER_SITE_OTHER): Payer: Medicare Other | Admitting: Family Medicine

## 2016-09-23 ENCOUNTER — Encounter: Payer: Self-pay | Admitting: Family Medicine

## 2016-09-23 ENCOUNTER — Ambulatory Visit (INDEPENDENT_AMBULATORY_CARE_PROVIDER_SITE_OTHER)
Admission: RE | Admit: 2016-09-23 | Discharge: 2016-09-23 | Disposition: A | Discharge status: Home / Self Care | Payer: Medicare Other | Source: Ambulatory Visit | Attending: Family Medicine | Admitting: Family Medicine

## 2016-09-23 VITALS — BP 116/62 | HR 71 | Temp 98.7°F | Ht 63.0 in | Wt 137.2 lb

## 2016-09-23 DIAGNOSIS — I700 Atherosclerosis of aorta: Secondary | ICD-10-CM | POA: Insufficient documentation

## 2016-09-23 DIAGNOSIS — Z01818 Encounter for other preprocedural examination: Secondary | ICD-10-CM

## 2016-09-23 DIAGNOSIS — Z01810 Encounter for preprocedural cardiovascular examination: Secondary | ICD-10-CM | POA: Diagnosis not present

## 2016-09-23 DIAGNOSIS — K449 Diaphragmatic hernia without obstruction or gangrene: Secondary | ICD-10-CM | POA: Diagnosis not present

## 2016-09-23 NOTE — Progress Notes (Signed)
Subjective:    Patient ID: Gina Williams, female    DOB: [birth year], 71 y.o.   MRN: [id number]  HPI  Here for pre op examination  She has planned hysterectomy with cystocele repair  (for uterine prolapse) with oophrectomy sched for 10/4 with Dr Valentino Saxon Plans to have it vaginally  Will stay in the hospital one night   Also had an enl ovary- her CA 125 was normal   General anesthesia is planned  She had a lot of nausea from last gen anesth -she has not had her interview with anesthesiologist    EKG NSR with rate of 70 and no acute changes  Needs CXR today   Wt Readings from Last 3 Encounters:  09/23/16 137 lb 4 oz (62.3 kg)  02/11/16 141 lb 4 oz (64.1 kg)  01/01/16 139 lb 4 oz (63.2 kg)   24.31 kg/m  BP Readings from Last 3 Encounters:  09/23/16 116/62  02/11/16 120/60  01/01/16 (!) 118/58   Pulse Readings from Last 3 Encounters:  09/23/16 71  02/11/16 80  01/01/16 79    Former smoker - quit 2000 Hx of mastectomy in 09 for breast cancer   Hx of hyperlipidemia Lab Results  Component Value Date   CHOL 190 12/26/2015   HDL 51.00 12/26/2015   LDLCALC 118 (H) 12/26/2015   LDLDIRECT 156.2 10/27/2012   TRIG 102.0 12/26/2015   CHOLHDL 4 12/26/2015   Controlled by diet   Intolerant to augmentin (nausea)   No hx of anaphylaxis    She does not take any asa or blood thinners or nsaids    Patient Active Problem List   Diagnosis Date Noted  . Pre-operative examination 09/23/2016  . Atherosclerosis of aorta ([place]) 09/23/2016  . Need for hepatitis C screening test 12/23/2015  . Routine general medical examination at a health care facility 12/01/2014  . Estrogen deficiency 12/01/2014  . Osteopenia 11/28/2013  . Colon cancer screening 11/03/2012  . Hyperlipidemia 11/03/2012  . Encounter for Medicare annual wellness exam 10/26/2012  . GERD 01/23/2010  . History of breast cancer 01/23/2010   Past Medical History:  Diagnosis Date  . Breast cancer ([place])  09/16/2007   Mastectomy  . GERD (gastroesophageal reflux disease)   . Osteopenia    In the past, now nl. BMD  . Vertigo 2007   Past Surgical History:  Procedure Laterality Date  . GANGLION CYST EXCISION Left 1970s   Multiple surgeries  . HAND SURGERY Left 10/2005   Nerve damage  . HAND SURGERY Left 2011   Torn ligament surgery  . MASTECTOMY  8/09   Breast Cancer  . Nuclear Stress Test     Negative  EF 77%  . pap  08/05/2014   [facility]  . pessary  12/19/2015   [facility] Dr. Pamala Hurry  . SHOULDER SURGERY Bilateral Late 1980s   Frozen shoulders bilaterally  . Stress myoview  12/05   Low risk  . TONSILLECTOMY  Late 71s   Social History  Substance Use Topics  . Smoking status: Former Smoker    Quit date: 05/21/1998  . Smokeless tobacco: Never Used  . Alcohol use No   Family History  Problem Relation Age of Onset  . Asthma Mother   . Aortic aneurysm Mother   . Stroke Father   . Alzheimer's disease Father   . Cancer Paternal Aunt        Breast Cancer  . Breast cancer Paternal Aunt   . Cancer Paternal  Aunt        Breast Cancer  . Breast cancer Paternal Aunt   . Cancer Paternal Aunt        Breast Cancer  . Breast cancer Paternal Aunt   . Stroke Maternal Uncle   . Hypertension Other        HTN on Father's side of family   Allergies  Allergen Reactions  . Amoxicillin-Pot Clavulanate     REACTION: nausea (tolerates amoxil)  . Omeprazole     ineffective    Current Outpatient Prescriptions on File Prior to Visit  Medication Sig Dispense Refill  . acetaminophen (TYLENOL) 325 MG tablet Take 650 mg by mouth every 6 (six) hours as needed.    [name] Kitchen NEXIUM 40 MG capsule TAKE ONE (1) CAPSULE BY MOUTH EACH DAY BEFORE BREAKFAST 30 capsule 11  . Omega-3 Fatty Acids (FISH OIL PO) Take 1 capsule by mouth daily.     No current facility-administered medications on file prior to visit.     Review of Systems Review of Systems  Constitutional: Negative for fever,  appetite change, fatigue and unexpected weight change.  Eyes: Negative for pain and visual disturbance.  Respiratory: Negative for cough and shortness of breath.   Cardiovascular: Negative for cp or palpitations    Gastrointestinal: Negative for nausea, diarrhea and constipation.  Genitourinary: pos for urinary incontinence/ urgency (from pelvic prolapse) , neg for dysuria  Skin: Negative for pallor or rash   Neurological: Negative for weakness, light-headedness, numbness and headaches.  Hematological: Negative for adenopathy. Does not bruise/bleed easily.  Psychiatric/Behavioral: Negative for dysphoric mood. The patient is not nervous/anxious.         Objective:   Physical Exam  Constitutional: She appears well-developed and well-nourished. No distress.  Well appearing   HENT:  Head: Normocephalic and atraumatic.  Nose: Nose normal.  Mouth/Throat: Oropharynx is clear and moist.  Eyes: Pupils are equal, round, and reactive to light. Conjunctivae and EOM are normal. Right eye exhibits no discharge. Left eye exhibits no discharge. No scleral icterus.  Neck: Normal range of motion. Neck supple. No JVD present. Carotid bruit is not present. No thyromegaly present.  Cardiovascular: Normal rate, regular rhythm, normal heart sounds and intact distal pulses.  Exam reveals no gallop.   Pulmonary/Chest: Effort normal and breath sounds normal. No respiratory distress. She has no wheezes. She has no rales.  Abdominal: Soft. Bowel sounds are normal. She exhibits no distension and no mass. There is no tenderness.  Musculoskeletal: She exhibits no edema or tenderness.  Lymphadenopathy:    She has no cervical adenopathy.  Neurological: She is alert. She has normal reflexes. No cranial nerve deficit. She exhibits normal muscle tone. Coordination normal.  Skin: Skin is warm and dry. No rash noted. No erythema. No pallor.  Solar lentigines diffusely   Psychiatric: She has a normal mood and affect.           Assessment & Plan:   Problem List Items Addressed This Visit      Other   Pre-operative examination    Cleared medically for her gyn surgery  EKG normal  CXR pending  Nl exam  She will discuss her past rxn to anesthesia (nausea) with the team before her surgery  No hx of anaphylaxis No cardiac or pulmonary problems No asa or blood thinners        Other Visit Diagnoses    Pre-operative cardiovascular examination    -  Primary   Relevant Orders   EKG  12-Lead (Completed)

## 2016-09-23 NOTE — Patient Instructions (Signed)
Chest xray now  I expect you will do well with surgery  Take care of yourself

## 2016-09-24 NOTE — Assessment & Plan Note (Signed)
Cleared medically for her gyn surgery  EKG normal  CXR pending  Nl exam  She will discuss her past rxn to anesthesia (nausea) with the team before her surgery  No hx of anaphylaxis No cardiac or pulmonary problems No asa or blood thinners

## 2016-09-30 ENCOUNTER — Other Ambulatory Visit: Payer: Self-pay | Admitting: Obstetrics

## 2016-10-09 NOTE — Patient Instructions (Signed)
Your procedure is scheduled on:  Thursday, Oct. 4, 2018  Enter through the [employer] [facility] at:  6:30 AM  Pick up the phone at the desk and dial [phone].  Call this number if you have problems the morning of surgery: [phone].  Remember: Do NOT eat food or drink after:  Midnight Wednesday  Take these medicines the morning of surgery with a SIP OF WATER:  Nexium  Stop ALL herbal medications and Omega Fish oil at this time  Do NOT smoke the day of surgery.  Do NOT wear jewelry (body piercing), metal hair clips/bobby pins, make-up, artifical eyelashes or nail polish. Do NOT wear lotions, powders, or perfumes.  You may wear deodorant. Do NOT shave for 48 hours prior to surgery. Do NOT bring valuables to the hospital. Contacts, dentures, or bridgework may not be worn into surgery.  Leave suitcase in car.  After surgery it may be brought to your room.  For patients admitted to the hospital, checkout time is 11:00 AM the day of discharge.  Bring a copy of your healthcare power of attorney and living will documents.

## 2016-10-10 ENCOUNTER — Encounter (HOSPITAL_COMMUNITY): Payer: Self-pay

## 2016-10-10 ENCOUNTER — Encounter (HOSPITAL_COMMUNITY)
Admission: RE | Admit: 2016-10-10 | Discharge: 2016-10-10 | Disposition: A | Discharge status: Home / Self Care | Payer: Medicare Other | Source: Ambulatory Visit | Attending: Obstetrics | Admitting: Obstetrics

## 2016-10-10 DIAGNOSIS — Z01812 Encounter for preprocedural laboratory examination: Secondary | ICD-10-CM | POA: Insufficient documentation

## 2016-10-10 HISTORY — DX: Dyspnea, unspecified: R06.00

## 2016-10-10 HISTORY — DX: Unspecified osteoarthritis, unspecified site: M19.90

## 2016-10-10 HISTORY — DX: Nausea with vomiting, unspecified: R11.2

## 2016-10-10 HISTORY — DX: Other specified postprocedural states: Z98.890

## 2016-10-10 LAB — BASIC METABOLIC PANEL
ANION GAP: 5 (ref 5–15)
BUN: 19 mg/dL (ref 6–20)
CHLORIDE: 107 mmol/L (ref 101–111)
CO2: 27 mmol/L (ref 22–32)
Calcium: 9.2 mg/dL (ref 8.9–10.3)
Creatinine, Ser: 0.88 mg/dL (ref 0.44–1.00)
GFR calc non Af Amer: >60 mL/min (ref >60)
GLUCOSE: 79 mg/dL (ref 65–99)
Potassium: 4.7 mmol/L (ref 3.5–5.1)
Sodium: 139 mmol/L (ref 135–145)

## 2016-10-10 LAB — CBC
HCT: 42.5 % (ref 36.0–46.0)
Hemoglobin: 14.1 g/dL (ref 12.0–15.0)
MCH: 29.6 pg (ref 26.0–34.0)
MCHC: 33.2 g/dL (ref 30.0–36.0)
MCV: 89.1 fL (ref 78.0–100.0)
Platelets: 282 10*3/uL (ref 150–400)
RBC: 4.77 MIL/uL (ref 3.87–5.11)
RDW: 13.3 % (ref 11.5–15.5)
WBC: 10.2 10*3/uL (ref 4.0–10.5)

## 2016-10-10 LAB — TYPE AND SCREEN
ABO/RH(D): O POS
Antibody Screen: NEGATIVE

## 2016-10-10 LAB — ABO/RH: ABO/RH(D): O POS

## 2016-10-22 NOTE — H&P (Signed)
CC: uterine prolapse, cystocele  HPI: 71 yo menopausal woman with h/o breast CA here for robotic assisted TLH/ BSO with uterosacral ligament suspension and possible cystocele repair. Pt with h/o uterine prolapse, had adequate improvement with pessary but desires surgical management.  Given her h/o breast CA, pt prefers BSO as well. Pt with mixed incontinence, impacted most by cystocele and overflow incontinence. Pre-op urodynamics did not demonstrate overactive bladder or intrinsic sphincter deficiency.  Nl pap 6/18, embx deferred given nl u/s findings. U/s uterus 6 x 1.7 x 3cm. Increased R ovarian volume with heterogenous echotexture but nl CA-125.   Past Medical History:  Diagnosis Date  . Arthritis    hands  . Breast cancer ([place]) 09/16/2007   Mastectomy  . Dyspnea    last 5 months, lack of conditioning  . GERD (gastroesophageal reflux disease)   . Osteopenia    In the past, now nl. BMD  . PONV (postoperative nausea and vomiting)    SEVERE, patient is very concerned about this issue  . Vertigo 2007    Hiatal hernia Chronic bronchitis  Past Surgical History:  Procedure Laterality Date  . CATARACT EXTRACTION, BILATERAL    . COLONOSCOPY    . GANGLION CYST EXCISION Left 1970s   Multiple surgeries  . HAND SURGERY Left 10/2005   Nerve damage  . HAND SURGERY Left 2011   Torn ligament surgery  . MASTECTOMY  8/09   Breast Cancer  . Nuclear Stress Test     Negative  EF 77%  . pap  08/05/2014   [facility]  . pessary  12/19/2015   [facility] Dr. Pamala Hurry  . SHOULDER SURGERY Bilateral Late 1980s   Frozen shoulders bilaterally, no surgery, had physical therapy  . Stress myoview  12/05   Low risk  . TONSILLECTOMY  Late 1970s  . TUBAL LIGATION      PE: Vitals:   10/23/16 0641  BP: 104/87  Pulse: 82  Resp: 16  Temp: 98 F (36.7 C)  TempSrc: Oral  SpO2: 100%    Gen: well appearing, no distress Abd: soft, NT GU: def to OR LE: NT, no edema.  CBC    Component  Value Date/Time   WBC 10.2 10/10/2016 1120   RBC 4.77 10/10/2016 1120   HGB 14.1 10/10/2016 1120   HGB 13.8 05/07/2011 1332   HCT 42.5 10/10/2016 1120   HCT 41.5 05/07/2011 1332   PLT 282 10/10/2016 1120   PLT 266 05/07/2011 1332   MCV 89.1 10/10/2016 1120   MCV 88.8 05/07/2011 1332   MCH 29.6 10/10/2016 1120   MCHC 33.2 10/10/2016 1120   RDW 13.3 10/10/2016 1120   RDW 12.9 05/07/2011 1332   LYMPHSABS 2.2 12/26/2015 0916   LYMPHSABS 1.9 05/07/2011 1332   MONOABS 0.8 12/26/2015 0916   MONOABS 0.6 05/07/2011 1332   EOSABS 0.2 12/26/2015 0916   EOSABS 0.2 05/07/2011 1332   BASOSABS 0.0 12/26/2015 0916   BASOSABS 0.1 05/07/2011 1332    CMP Latest Ref Rng & Units 10/10/2016 12/26/2015 11/24/2014  Glucose 65 - 99 mg/dL 79 85 91  BUN 6 - 20 mg/dL 19 19 12   Creatinine 0.44 - 1.00 mg/dL 0.88 0.90 0.82  Sodium 135 - 145 mmol/L 139 139 141  Potassium 3.5 - 5.1 mmol/L 4.7 5.1 4.2  Chloride 101 - 111 mmol/L 107 105 108  CO2 22 - 32 mmol/L 27 27 26   Calcium 8.9 - 10.3 mg/dL 9.2 9.5 9.4  Total Protein 6.0 - 8.3 g/dL -  7.0 6.7  Total Bilirubin 0.2 - 1.2 mg/dL - 0.5 0.5  Alkaline Phos 39 - 117 U/L - 63 63  AST 0 - 37 U/L - 16 16  ALT 0 - 35 U/L - 14 15   A/P: 71 yo with uterine prolapse for robotic assisted TLH with BSO and uterosacral ligament suspension with possible cystocele repair.  - pt aware surgical and anasthetic risks, aware risks bleeding, infection, damage to surrounding organs and failure risks of pelvic supportive procedures. Pt aware risks of de-novo stress or urge incontinence and possible need for further procedures.   - medical clearance from PCP  [facility] A. 10/23/2016 7:54 AM   .

## 2016-10-22 NOTE — Anesthesia Preprocedure Evaluation (Addendum)
Anesthesia Evaluation  Patient identified by MRN, date of birth, ID band Patient awake    Reviewed: Allergy & Precautions, NPO status , Patient's Chart, lab work & pertinent test results  History of Anesthesia Complications (+) PONV and history of anesthetic complications  Airway Mallampati: I  TM Distance: >3 FB Neck ROM: Full    Dental  (+) Upper Dentures, Dental Advisory Given   Pulmonary neg pulmonary ROS, former smoker,    breath sounds clear to auscultation       Cardiovascular + Peripheral Vascular Disease   Rhythm:Regular Rate:Normal     Neuro/Psych negative neurological ROS  negative psych ROS   GI/Hepatic Neg liver ROS, GERD  ,  Endo/Other  negative endocrine ROS  Renal/GU negative Renal ROS     Musculoskeletal  (+) Arthritis ,   Abdominal   Peds  Hematology negative hematology ROS (+)   Anesthesia Other Findings Day of surgery medications reviewed with the patient.  Reproductive/Obstetrics                            Anesthesia Physical Anesthesia Plan  ASA: II  Anesthesia Plan: General   Post-op Pain Management:    Induction: Intravenous  PONV Risk Score and Plan: 4 or greater and Ondansetron, Dexamethasone, Midazolam, Scopolamine patch - Pre-op and Treatment may vary due to age or medical condition  Airway Management Planned: Oral ETT  Additional Equipment:   Intra-op Plan:   Post-operative Plan: Extubation in OR  Informed Consent: I have reviewed the patients History and Physical, chart, labs and discussed the procedure including the risks, benefits and alternatives for the proposed anesthesia with the patient or authorized representative who has indicated his/her understanding and acceptance.   Dental advisory given  Plan Discussed with: CRNA  Anesthesia Plan Comments:         Anesthesia Quick Evaluation

## 2016-10-23 ENCOUNTER — Ambulatory Visit (HOSPITAL_COMMUNITY): Payer: Medicare Other | Admitting: Anesthesiology

## 2016-10-23 ENCOUNTER — Ambulatory Visit (HOSPITAL_COMMUNITY)
Admission: AD | Admit: 2016-10-23 | Discharge: 2016-10-24 | Disposition: A | Discharge status: Home / Self Care | Payer: Medicare Other | Source: Ambulatory Visit | Attending: Obstetrics | Admitting: Obstetrics

## 2016-10-23 ENCOUNTER — Encounter (HOSPITAL_COMMUNITY): Payer: Self-pay

## 2016-10-23 ENCOUNTER — Encounter (HOSPITAL_COMMUNITY)
Admission: AD | Disposition: A | Discharge status: Home / Self Care | Payer: Self-pay | Source: Ambulatory Visit | Attending: Obstetrics

## 2016-10-23 DIAGNOSIS — N814 Uterovaginal prolapse, unspecified: Secondary | ICD-10-CM | POA: Diagnosis not present

## 2016-10-23 DIAGNOSIS — D259 Leiomyoma of uterus, unspecified: Secondary | ICD-10-CM | POA: Insufficient documentation

## 2016-10-23 DIAGNOSIS — I739 Peripheral vascular disease, unspecified: Secondary | ICD-10-CM | POA: Diagnosis not present

## 2016-10-23 DIAGNOSIS — N83202 Unspecified ovarian cyst, left side: Secondary | ICD-10-CM | POA: Insufficient documentation

## 2016-10-23 DIAGNOSIS — Z9010 Acquired absence of unspecified breast and nipple: Secondary | ICD-10-CM | POA: Diagnosis not present

## 2016-10-23 DIAGNOSIS — Z79899 Other long term (current) drug therapy: Secondary | ICD-10-CM | POA: Insufficient documentation

## 2016-10-23 DIAGNOSIS — Z853 Personal history of malignant neoplasm of breast: Secondary | ICD-10-CM | POA: Diagnosis not present

## 2016-10-23 DIAGNOSIS — K219 Gastro-esophageal reflux disease without esophagitis: Secondary | ICD-10-CM | POA: Diagnosis not present

## 2016-10-23 DIAGNOSIS — R339 Retention of urine, unspecified: Secondary | ICD-10-CM | POA: Diagnosis not present

## 2016-10-23 DIAGNOSIS — E785 Hyperlipidemia, unspecified: Secondary | ICD-10-CM | POA: Diagnosis not present

## 2016-10-23 DIAGNOSIS — N8111 Cystocele, midline: Secondary | ICD-10-CM | POA: Diagnosis not present

## 2016-10-23 DIAGNOSIS — Z90710 Acquired absence of both cervix and uterus: Secondary | ICD-10-CM | POA: Diagnosis present

## 2016-10-23 DIAGNOSIS — N39490 Overflow incontinence: Secondary | ICD-10-CM | POA: Diagnosis not present

## 2016-10-23 DIAGNOSIS — Z87891 Personal history of nicotine dependence: Secondary | ICD-10-CM | POA: Insufficient documentation

## 2016-10-23 HISTORY — PX: CYSTOSCOPY: SHX5120

## 2016-10-23 HISTORY — PX: ROBOTIC ASSISTED TOTAL HYSTERECTOMY WITH BILATERAL SALPINGO OOPHERECTOMY: SHX6086

## 2016-10-23 SURGERY — ROBOTIC ASSISTED TOTAL HYSTERECTOMY WITH BILATERAL SALPINGO OOPHORECTOMY
Anesthesia: General | Site: Bladder

## 2016-10-23 MED ORDER — SCOPOLAMINE 1 MG/3DAYS TD PT72
MEDICATED_PATCH | TRANSDERMAL | Status: AC
  Filled 2016-10-23: qty 1

## 2016-10-23 MED ORDER — DEXAMETHASONE SODIUM PHOSPHATE 4 MG/ML IJ SOLN
INTRAMUSCULAR | Status: AC
  Filled 2016-10-23: qty 1

## 2016-10-23 MED ORDER — CEFAZOLIN SODIUM-DEXTROSE 2-3 GM-% IV SOLR
INTRAVENOUS | Status: DC | PRN
  Administered 2016-10-23: 2 g via INTRAVENOUS

## 2016-10-23 MED ORDER — SIMETHICONE 80 MG PO CHEW: 80.0000 mg | CHEWABLE_TABLET | Freq: Four times a day (QID) | ORAL | Status: DC | PRN

## 2016-10-23 MED ORDER — KETOROLAC TROMETHAMINE 30 MG/ML IJ SOLN
INTRAMUSCULAR | Status: DC | PRN
  Administered 2016-10-23: 30 mg via INTRAVENOUS

## 2016-10-23 MED ORDER — METHYLENE BLUE 0.5 % INJ SOLN
INTRAVENOUS | Status: DC | PRN
  Administered 2016-10-23: 2 mL via INTRAVENOUS
  Administered 2016-10-23: 4 mL via INTRAVENOUS

## 2016-10-23 MED ORDER — MEPERIDINE HCL 25 MG/ML IJ SOLN
6.2500 mg | INTRAMUSCULAR | Status: DC | PRN
  Administered 2016-10-23: 6.25 mg via INTRAVENOUS

## 2016-10-23 MED ORDER — PROPOFOL 500 MG/50ML IV EMUL
INTRAVENOUS | Status: DC | PRN
  Administered 2016-10-23: 25 ug/kg/min via INTRAVENOUS

## 2016-10-23 MED ORDER — STERILE WATER FOR IRRIGATION IR SOLN
Status: DC | PRN
  Administered 2016-10-23: 1000 mL via INTRAVESICAL

## 2016-10-23 MED ORDER — OXYCODONE-ACETAMINOPHEN 5-325 MG PO TABS: 1.0000 | ORAL_TABLET | ORAL | Status: DC | PRN

## 2016-10-23 MED ORDER — ROPIVACAINE HCL 5 MG/ML IJ SOLN
INTRAMUSCULAR | Status: AC
  Filled 2016-10-23: qty 30

## 2016-10-23 MED ORDER — SODIUM CHLORIDE 0.9 % IJ SOLN
INTRAMUSCULAR | Status: AC
  Filled 2016-10-23: qty 10

## 2016-10-23 MED ORDER — SODIUM CHLORIDE 0.9 % IJ SOLN
INTRAMUSCULAR | Status: AC
  Filled 2016-10-23: qty 20

## 2016-10-23 MED ORDER — LACTATED RINGERS IV SOLN: INTRAVENOUS | Status: DC

## 2016-10-23 MED ORDER — BUPIVACAINE-EPINEPHRINE (PF) 0.5% -1:200000 IJ SOLN
INTRAMUSCULAR | Status: AC
  Filled 2016-10-23: qty 30

## 2016-10-23 MED ORDER — ONDANSETRON HCL 4 MG PO TABS: 4.0000 mg | ORAL_TABLET | Freq: Four times a day (QID) | ORAL | Status: DC | PRN

## 2016-10-23 MED ORDER — KETOROLAC TROMETHAMINE 30 MG/ML IJ SOLN
INTRAMUSCULAR | Status: AC
  Filled 2016-10-23: qty 1

## 2016-10-23 MED ORDER — HYDROMORPHONE HCL 1 MG/ML IJ SOLN: 1.0000 mg | INTRAMUSCULAR | Status: DC | PRN

## 2016-10-23 MED ORDER — PROPOFOL 500 MG/50ML IV EMUL
INTRAVENOUS | Status: AC
  Filled 2016-10-23: qty 50

## 2016-10-23 MED ORDER — LIDOCAINE HCL (CARDIAC) 20 MG/ML IV SOLN
INTRAVENOUS | Status: DC | PRN
  Administered 2016-10-23: 50 mg via INTRAVENOUS

## 2016-10-23 MED ORDER — BUPIVACAINE HCL (PF) 0.25 % IJ SOLN
INTRAMUSCULAR | Status: AC
  Filled 2016-10-23: qty 30

## 2016-10-23 MED ORDER — LIDOCAINE HCL (PF) 1 % IJ SOLN
INTRAMUSCULAR | Status: AC
  Filled 2016-10-23: qty 5

## 2016-10-23 MED ORDER — DEXAMETHASONE SODIUM PHOSPHATE 10 MG/ML IJ SOLN
INTRAMUSCULAR | Status: DC | PRN
  Administered 2016-10-23: 4 mg via INTRAVENOUS

## 2016-10-23 MED ORDER — MIDAZOLAM HCL 2 MG/2ML IJ SOLN
INTRAMUSCULAR | Status: AC
  Filled 2016-10-23: qty 2

## 2016-10-23 MED ORDER — ARTIFICIAL TEARS OPHTHALMIC OINT
TOPICAL_OINTMENT | OPHTHALMIC | Status: AC
  Filled 2016-10-23: qty 3.5

## 2016-10-23 MED ORDER — SODIUM CHLORIDE 0.9 % IV SOLN
INTRAVENOUS | Status: DC | PRN
  Administered 2016-10-23: 60 mL

## 2016-10-23 MED ORDER — IBUPROFEN 600 MG PO TABS: 600.0000 mg | ORAL_TABLET | Freq: Four times a day (QID) | ORAL | Status: DC | PRN

## 2016-10-23 MED ORDER — HYDROMORPHONE HCL 1 MG/ML IJ SOLN: 0.2500 mg | INTRAMUSCULAR | Status: DC | PRN

## 2016-10-23 MED ORDER — KETOROLAC TROMETHAMINE 15 MG/ML IJ SOLN
15.0000 mg | Freq: Four times a day (QID) | INTRAMUSCULAR | Status: DC
  Administered 2016-10-23 – 2016-10-24 (×3): 15 mg via INTRAVENOUS
  Filled 2016-10-23 (×3): qty 1

## 2016-10-23 MED ORDER — LACTATED RINGERS IV SOLN
INTRAVENOUS | Status: DC
  Administered 2016-10-23: 125 mL/h via INTRAVENOUS
  Administered 2016-10-23 (×2): via INTRAVENOUS

## 2016-10-23 MED ORDER — SUGAMMADEX SODIUM 200 MG/2ML IV SOLN
INTRAVENOUS | Status: DC | PRN
  Administered 2016-10-23: 125 mg via INTRAVENOUS

## 2016-10-23 MED ORDER — FENTANYL CITRATE (PF) 250 MCG/5ML IJ SOLN
INTRAMUSCULAR | Status: AC
  Filled 2016-10-23: qty 5

## 2016-10-23 MED ORDER — ONDANSETRON HCL 4 MG/2ML IJ SOLN
INTRAMUSCULAR | Status: AC
  Filled 2016-10-23: qty 2

## 2016-10-23 MED ORDER — PROPOFOL 10 MG/ML IV BOLUS
INTRAVENOUS | Status: DC | PRN
  Administered 2016-10-23: 120 mg via INTRAVENOUS

## 2016-10-23 MED ORDER — FENTANYL CITRATE (PF) 100 MCG/2ML IJ SOLN
INTRAMUSCULAR | Status: DC | PRN
  Administered 2016-10-23 (×3): 50 ug via INTRAVENOUS
  Administered 2016-10-23: 100 ug via INTRAVENOUS
  Administered 2016-10-23 (×2): 50 ug via INTRAVENOUS

## 2016-10-23 MED ORDER — KETOROLAC TROMETHAMINE 30 MG/ML IJ SOLN: 30.0000 mg | Freq: Four times a day (QID) | INTRAMUSCULAR | Status: DC

## 2016-10-23 MED ORDER — MEPERIDINE HCL 25 MG/ML IJ SOLN
INTRAMUSCULAR | Status: AC
  Filled 2016-10-23: qty 1

## 2016-10-23 MED ORDER — MENTHOL 3 MG MT LOZG: 1.0000 | LOZENGE | OROMUCOSAL | Status: DC | PRN

## 2016-10-23 MED ORDER — SCOPOLAMINE 1 MG/3DAYS TD PT72
1.0000 | MEDICATED_PATCH | TRANSDERMAL | Status: DC
  Administered 2016-10-23: 1.5 mg via TRANSDERMAL

## 2016-10-23 MED ORDER — SODIUM CHLORIDE 0.9 % IR SOLN
Status: DC | PRN
  Administered 2016-10-23: 3000 mL

## 2016-10-23 MED ORDER — PROPOFOL 10 MG/ML IV BOLUS
INTRAVENOUS | Status: AC
  Filled 2016-10-23: qty 20

## 2016-10-23 MED ORDER — ONDANSETRON HCL 4 MG/2ML IJ SOLN
INTRAMUSCULAR | Status: DC | PRN
  Administered 2016-10-23: 4 mg via INTRAVENOUS

## 2016-10-23 MED ORDER — ONDANSETRON HCL 4 MG/2ML IJ SOLN: 4.0000 mg | Freq: Four times a day (QID) | INTRAMUSCULAR | Status: DC | PRN

## 2016-10-23 MED ORDER — BUPIVACAINE HCL (PF) 0.25 % IJ SOLN
INTRAMUSCULAR | Status: DC | PRN
  Administered 2016-10-23: 19 mL

## 2016-10-23 MED ORDER — ROCURONIUM BROMIDE 100 MG/10ML IV SOLN
INTRAVENOUS | Status: DC | PRN
  Administered 2016-10-23 (×2): 10 mg via INTRAVENOUS
  Administered 2016-10-23: 40 mg via INTRAVENOUS
  Administered 2016-10-23: 10 mg via INTRAVENOUS

## 2016-10-23 MED ORDER — PROMETHAZINE HCL 25 MG/ML IJ SOLN: 6.2500 mg | INTRAMUSCULAR | Status: DC | PRN

## 2016-10-23 MED ORDER — MIDAZOLAM HCL 2 MG/2ML IJ SOLN
INTRAMUSCULAR | Status: DC | PRN
  Administered 2016-10-23 (×2): 1 mg via INTRAVENOUS

## 2016-10-23 MED ORDER — SODIUM CHLORIDE 0.9 % IV SOLN
INTRAVENOUS | Status: DC
  Administered 2016-10-23 – 2016-10-24 (×2): via INTRAVENOUS

## 2016-10-23 MED ORDER — KETOROLAC TROMETHAMINE 30 MG/ML IJ SOLN
30.0000 mg | Freq: Four times a day (QID) | INTRAMUSCULAR | Status: DC
  Administered 2016-10-23: 30 mg via INTRAVENOUS
  Filled 2016-10-23: qty 1

## 2016-10-23 SURGICAL SUPPLY — 69 items
ADH SKN CLS APL DERMABOND .7 (GAUZE/BANDAGES/DRESSINGS) ×3
BARRIER ADHS 3X4 INTERCEED (GAUZE/BANDAGES/DRESSINGS) IMPLANT
BRR ADH 4X3 ABS CNTRL BYND (GAUZE/BANDAGES/DRESSINGS)
CANISTER SUCT 3000ML PPV (MISCELLANEOUS) ×5 IMPLANT
CATH FOLEY 3WAY  5CC 16FR (CATHETERS) ×2
CATH FOLEY 3WAY 5CC 16FR (CATHETERS) ×3 IMPLANT
CLOTH BEACON ORANGE TIMEOUT ST (SAFETY) ×5 IMPLANT
CONT PATH 16OZ SNAP LID 3702 (MISCELLANEOUS) ×5 IMPLANT
COVER BACK TABLE 60X90IN (DRAPES) ×10 IMPLANT
COVER TIP SHEARS 8 DVNC (MISCELLANEOUS) ×3 IMPLANT
COVER TIP SHEARS 8MM DA VINCI (MISCELLANEOUS) ×2
DECANTER SPIKE VIAL GLASS SM (MISCELLANEOUS) ×10 IMPLANT
DEFOGGER SCOPE WARMER CLEARIFY (MISCELLANEOUS) ×5 IMPLANT
DERMABOND ADVANCED (GAUZE/BANDAGES/DRESSINGS) ×2
DERMABOND ADVANCED .7 DNX12 (GAUZE/BANDAGES/DRESSINGS) ×3 IMPLANT
DEVICE CAPIO SLIM SINGLE (INSTRUMENTS) IMPLANT
DURAPREP 26ML APPLICATOR (WOUND CARE) ×5 IMPLANT
ELECT REM PT RETURN 9FT ADLT (ELECTROSURGICAL) ×5
ELECTRODE REM PT RTRN 9FT ADLT (ELECTROSURGICAL) ×3 IMPLANT
GAUZE PACKING IODOFORM 2 (PACKING) IMPLANT
GLOVE BIO SURGEON STRL SZ 6.5 (GLOVE) ×8 IMPLANT
GLOVE BIO SURGEONS STRL SZ 6.5 (GLOVE) ×2
GLOVE BIOGEL PI IND STRL 6.5 (GLOVE) ×3 IMPLANT
GLOVE BIOGEL PI IND STRL 7.0 (GLOVE) ×15 IMPLANT
GLOVE BIOGEL PI INDICATOR 6.5 (GLOVE) ×2
GLOVE BIOGEL PI INDICATOR 7.0 (GLOVE) ×10
GOWN STRL REUS W/TWL LRG LVL3 (GOWN DISPOSABLE) ×20 IMPLANT
KIT ACCESSORY DA VINCI DISP (KITS) ×2
KIT ACCESSORY DVNC DISP (KITS) ×3 IMPLANT
LEGGING LITHOTOMY PAIR STRL (DRAPES) ×8 IMPLANT
NEEDLE HYPO 22GX1.5 SAFETY (NEEDLE) IMPLANT
NS IRRIG 1000ML POUR BTL (IV SOLUTION) ×5 IMPLANT
OCCLUDER COLPOPNEUMO (BALLOONS) ×5 IMPLANT
PACK ROBOT WH (CUSTOM PROCEDURE TRAY) ×5 IMPLANT
PACK ROBOTIC GOWN (GOWN DISPOSABLE) ×5 IMPLANT
PACK TRENDGUARD 450 HYBRID PRO (MISCELLANEOUS) ×1 IMPLANT
PACK TRENDGUARD 600 HYBRD PROC (MISCELLANEOUS) IMPLANT
PACK VAGINAL WOMENS (CUSTOM PROCEDURE TRAY) ×2 IMPLANT
PAD PREP 24X48 CUFFED NSTRL (MISCELLANEOUS) ×5 IMPLANT
SET CYSTO W/LG BORE CLAMP LF (SET/KITS/TRAYS/PACK) ×3 IMPLANT
SET IRRIG TUBING LAPAROSCOPIC (IRRIGATION / IRRIGATOR) ×5 IMPLANT
SET TRI-LUMEN FLTR TB AIRSEAL (TUBING) ×5 IMPLANT
SUT CAPIO ETHIBPND (SUTURE) IMPLANT
SUT ETHIBOND 0 (SUTURE) ×3 IMPLANT
SUT VIC AB 0 CT1 27 (SUTURE) ×10
SUT VIC AB 0 CT1 27XBRD ANBCTR (SUTURE) ×6 IMPLANT
SUT VIC AB 2-0 UR5 27 (SUTURE) IMPLANT
SUT VIC AB 4-0 PS2 27 (SUTURE) ×10 IMPLANT
SUT VICRYL 0 UR6 27IN ABS (SUTURE) ×5 IMPLANT
SUT VLOC 180 0 9IN  GS21 (SUTURE) ×8
SUT VLOC 180 0 9IN GS21 (SUTURE) ×6 IMPLANT
SYR 50ML LL SCALE MARK (SYRINGE) ×5 IMPLANT
SYRINGE 10CC LL (SYRINGE) IMPLANT
SYSTEM CONVERTIBLE TROCAR (TROCAR) ×5 IMPLANT
TIP RUMI ORANGE 6.7MMX12CM (TIP) IMPLANT
TIP UTERINE 5.1X6CM LAV DISP (MISCELLANEOUS) IMPLANT
TIP UTERINE 6.7X10CM GRN DISP (MISCELLANEOUS) IMPLANT
TIP UTERINE 6.7X6CM WHT DISP (MISCELLANEOUS) IMPLANT
TIP UTERINE 6.7X8CM BLUE DISP (MISCELLANEOUS) ×3 IMPLANT
TOWEL OR 17X24 6PK STRL BLUE (TOWEL DISPOSABLE) ×15 IMPLANT
TRAY FOLEY CATH SILVER 14FR (SET/KITS/TRAYS/PACK) ×5 IMPLANT
TRENDGUARD 450 HYBRID PRO PACK (MISCELLANEOUS) ×5
TRENDGUARD 600 HYBRID PROC PK (MISCELLANEOUS)
TROCAR 12M 150ML BLUNT (TROCAR) ×5 IMPLANT
TROCAR DISP BLADELESS 8 DVNC (TROCAR) ×3 IMPLANT
TROCAR DISP BLADELESS 8MM (TROCAR) ×2
TROCAR PORT AIRSEAL 5X120 (TROCAR) ×2 IMPLANT
TROCAR PORT AIRSEAL 8X120 (TROCAR) ×3 IMPLANT
WATER STERILE IRR 1000ML POUR (IV SOLUTION) ×5 IMPLANT

## 2016-10-23 NOTE — Brief Op Note (Signed)
10/23/2016  11:40 AM  PATIENT:  Gina Williams  71 y.o. female  PRE-OPERATIVE DIAGNOSIS:  Uterine Prolapse, Cystocele, Urinary Retention  POST-OPERATIVE DIAGNOSIS:  Uterine Prolapse, Cystocele, Urinary Retention  PROCEDURE:  Procedure(s): ROBOTIC ASSISTED TOTAL HYSTERECTOMY WITH BILATERAL SALPINGO OOPHORECTOMY/Uterosacral Ligament Suspension (Bilateral) CYSTOSCOPY (N/A)  SURGEON:  Surgeon(s) and Role:    * Aloha Gell, MD - Primary    * Brien Few, MD - Assisting  PHYSICIAN ASSISTANT:   ASSISTANTS: [place]   ANESTHESIA:   general  EBL:  Total I/O In: 18 [I.V.:1900] Out: 210 [Urine:200; Blood:10]  BLOOD ADMINISTERED:none  DRAINS: Urinary Catheter (Foley)   LOCAL MEDICATIONS USED:  MARCAINE    and OTHER ropivicaine  SPECIMEN:  Source of Specimen:  Uterus cervix bilateral ovaries, bilateral fallopian tubes  DISPOSITION OF SPECIMEN:  PATHOLOGY  COUNTS:  YES  TOURNIQUET:  * No tourniquets in log *  DICTATION: .Note written in EPIC  PLAN OF CARE: Admit for overnight observation  PATIENT DISPOSITION:  PACU - hemodynamically stable.   Delay start of Pharmacological VTE agent (>24hrs) due to surgical blood loss or risk of bleeding: yes  Olvin Rohr A. 10/23/2016 11:41 AM

## 2016-10-23 NOTE — Anesthesia Procedure Notes (Signed)
Procedure Name: Intubation Date/Time: 10/23/2016 8:12 AM Performed by: Bufford Spikes Pre-anesthesia Checklist: Patient identified, Emergency Drugs available, Suction available and Patient being monitored Patient Re-evaluated:Patient Re-evaluated prior to induction Oxygen Delivery Method: Circle system utilized Preoxygenation: Pre-oxygenation with 100% oxygen Induction Type: IV induction Ventilation: Mask ventilation without difficulty Laryngoscope Size: Miller and 2 Grade View: Grade II Tube type: Oral Number of attempts: 1 Airway Equipment and Method: Stylet Placement Confirmation: ETT inserted through vocal cords under direct vision,  positive ETCO2 and breath sounds checked- equal and bilateral Secured at: 21 cm Tube secured with: Tape Dental Injury: Teeth and Oropharynx as per pre-operative assessment

## 2016-10-23 NOTE — Anesthesia Postprocedure Evaluation (Signed)
Anesthesia Post Note  Patient: Gina Williams  Procedure(s) Performed: ROBOTIC ASSISTED TOTAL HYSTERECTOMY WITH BILATERAL SALPINGO OOPHORECTOMY/Uterosacral Ligament Suspension (Bilateral Abdomen) CYSTOSCOPY (N/A Bladder)     Patient location during evaluation: PACU Anesthesia Type: General Level of consciousness: awake and alert Pain management: pain level controlled Vital Signs Assessment: post-procedure vital signs reviewed and stable Respiratory status: spontaneous breathing, nonlabored ventilation, respiratory function stable and patient connected to nasal cannula oxygen Cardiovascular status: blood pressure returned to baseline and stable Postop Assessment: no apparent nausea or vomiting Anesthetic complications: no    Last Vitals:  Vitals:   10/23/16 1323 10/23/16 1430  BP: (!) 140/54 (!) 129/58  Pulse: 76 84  Resp: 16 16  Temp: 36.8 C 36.7 C  SpO2: 99% 100%    Last Pain:  Vitals:   10/23/16 1430  TempSrc: Oral  PainSc:    Pain Goal: Patients Stated Pain Goal: 3 (10/23/16 0641)               Effie Berkshire

## 2016-10-23 NOTE — Op Note (Signed)
10/23/2016  11:40 AM  PATIENT:  Gina Williams  71 y.o. female  PRE-OPERATIVE DIAGNOSIS:  Uterine Prolapse, Cystocele, Urinary Retention  POST-OPERATIVE DIAGNOSIS:  Uterine Prolapse, Cystocele, Urinary Retention  PROCEDURE:  Procedure(s): ROBOTIC ASSISTED TOTAL HYSTERECTOMY WITH BILATERAL SALPINGO OOPHORECTOMY/Uterosacral Ligament Suspension (Bilateral) CYSTOSCOPY (N/A)  SURGEON:  Surgeon(s) and Role:    * Aloha Gell, MD - Primary    * Brien Few, MD - Assisting  PHYSICIAN ASSISTANT:   ASSISTANTS: Taavon   ANESTHESIA:   general  EBL:  Total I/O In: 15 [I.V.:1900] Out: 210 [Urine:200; Blood:10]  BLOOD ADMINISTERED:none  DRAINS: Urinary Catheter (Foley)   LOCAL MEDICATIONS USED:  MARCAINE    and OTHER ropivicaine  SPECIMEN:  Source of Specimen:  Uterus cervix bilateral ovaries, bilateral fallopian tubes  DISPOSITION OF SPECIMEN:  PATHOLOGY  COUNTS:  YES  TOURNIQUET:  * No tourniquets in log *  DICTATION: .Note written in EPIC  PLAN OF CARE: Admit for overnight observation  PATIENT DISPOSITION:  PACU - hemodynamically stable.   Delay start of Pharmacological VTE agent (>24hrs) due to surgical blood loss or risk of bleeding: yes    Procedure:Robotic-assisted total laparoscopic hysterectomy Complications: none Antibiotics: 2 g Ancef Findings: nl liver edge, normal gallbladder,  no evidence endometriosis, nl b/l ovaries and tubes, small atrophic uterus, complete uterine procidentia.  Hemostasis post-procedure with peristalsis of bilateral ureters. Bilateral ureteral jets seen with cystoscopy, no persistent cystocele or apical defect post ligament suspension  Indications: This is a 71 year old G1 P0 with bothersome prolapse symptoms and urinary incontinence due to overflow from urinary retention. Patient had success with pessary management but desired definitive surgical management. Risks benefits are reviewed with the patient.   Procedure: After  informed consent and discussion of alternatives to hysterectomy, the patient was taken to the operating room where general anesthesia was initiated without difficulty. She was prepped and draped in normal sterile fashion in the dorsal supine lithotomy position. A Foley catheter was inserted sterilely into the bladder. A bimanual examination was done to assess the size and position of the uterus. The cervix was grasped with tenaculum, complete procidentia was noticed and no speculum was needed.. The cervix was sounded to 8 cm. The cervix was assessed to identify the Rumi-Co size.  A small cup and an 8 cm shaft was used. The uterine balloon was inflated. Stay sutures were placed on the anterior and posterior cervix.   Gloved were changed. Attention was then turned to the patient's abdomen. 0.5 % marcaine was used prior to all incision. A total of 20 cc of marcaine was used.  A 10 mm incision was made in the umbilicus and blunt and sharp dissection was done until the fascia was identified. This was then grasped with Kocher clamps x2 and entered sharply. A pursestring suture of 0 Vicryl was then placed along the incision and a non-bladed Sheryle Hail was inserted into the peritoneal cavity. Intraperitoneal placement was confirmed with the use of the camera and pneumoperitoneum was created to 15 mm of mercury. The pursestring suture was secured around the port and pneumoperitoneum was maintained. Brief survey of the abdomen and pelvis was done with findings as above. The abdominal wall was assessed and additional port sites were marked. 8 mm incisions were placed in the right (two) and left lower quadrants (2) and non-bladed ports were placed under direct visualization. The robot was brought to the patient's side and attached with the right side docking. The robotic instruments were placed under direct visualization until proper  placement just over the uterus.  I then went to the robotic console. Brief survey of the  patient's abdomen and pelvis revealed  findings as above.Bilateral tubes and ovaries were normal. No significant adhesions were noted. I began on the right side: the right infundibulopelvic ligament was serially cauterized with bipolar cautery with the use of the PK device and transected with the monopolar scissors. The right mesial salpinx was cauterized and transected with good visualization of the ureter. The right round ligament was serially cauterized and transected. The anterior leaf of the broad ligament was dissected bluntly then monopolar cautery was used to separate the anterior and posterior leafs of the broad ligament. This was carried down through to the bladder flap. Attention was then turned to the patient's left side: the left infundibulopelvic ligament was cauterized and transected,  the left mesial salpinx was serially cauterized with the PK and transected with the monopolar scissors. The left round ligament was cauterized and transected. The anterior and posterior leaves of the broad ligament were bluntly and sharply dissected apart from each other. Monopolar cautery was used to come across the anterior leaf of the broad ligament. This dissection was carried down across to the midline to create the bladder flap. The leftt uterine artery was serially cauterized with the PK and transected with the monopolar scissors. The right uterine artery and then serially cauterized with the PK and transected with the monopolar scissors. The uterus was placed on stretch to allow better visualization of the arteries. The bladder flap was further taken down both sharply and with cautery. Cautery was used for hemostasis on several places along the bladder flap. At this point with the pressure inward on the Rumi, the anterior colpotomy was made with the monopolar scissors. This was carried around to the patient's left side. Additional bipolar cautery was used on the  both angles of the cuff- this was carried out  with the PK bipolar. The uterus was then positioned  anteriorly  to allow easy access to the posterior colpotomy. This was carried out with the monopolar scissors.  Good hemostasis was noted along the angles of the incision.  With manipulation of the specimen, the uterus, cervix , ovaries and bilateral tubes were pulled out through the vagina.   Irrigation was used and the vaginal cuff appeared hemostatic. A 9 inch V-lock suture was placed though the umbilical port. Instruments were changed to allow a suture cut needle driver through the #1 port and and a long-tipped forceps through the #2 port. The right angle was closed and locked with the V-lock suture. Running suture was carried out tothe L angle.  A more superficial layer of suture was placed travelling back to the midline The suture was cut and removed. Excellent hemostasis was noted. Suction irrigation was carried out and hemostasis was assured along the vaginal cuff. The utero-ovarian ligaments were reassessed also found to be hemostatic.  The uterosacral ligament suspension was then begun. Bilateral ureters were again seen in their course was identified. Using a stay suture at the midline of the vaginal cuff to elevate the vagina, the right uterosacral ligament was identified. Using a V locked suture to sutures were placed from a lateral to medial position around the distal uterosacral ligament. A proximal lateral to medial bite was taken on the proximal uterosacral ligament. With pulling this stitch the uterosacral ligament was shortened. This stitch was then placed through the vaginal cuff first posterior to anteriorly then anteriorly to posteriorly. The same suture was  then brought back through the contact point of the foreshortened distal and proximal uterosacral ligament. A final bite of the suture was placed through the uterosacral ligament and back through the vaginal cuff. The suture was cut. An identical procedure was performed with a V  locked suture on the left side. A permanent Ethibond suture was then placed in the pelvis. A single interrupted suture was placed at the right uterosacral ligament knuckle and tied. This was done on the left side as well. A final running V locked suture was used to re-peritonealize over the vaginal cuff and uterosacral ligament suspension. All sutures were removed   All free fluid was removed from the abdomen. The robotic portion was completed. The robot was undocked.   By standard laparoscopy the pelvis was irrigated and evaluated.The patient was placed in reverse Trendelenburg, all additional fluid was suctioned from the abdomen and pelvis the cuff was reinspected and  found to be hemostatic. All pedicles were also found to be hemostatic. 30 CC 1/4% ropivicaine was placed into the peritoneal cavity.  Under direct visualization the ports were removed. Pneumoperitoneum was released and the umbilical port was removed.  The  pursestring suture at the umbilicus was then closed. No additional fascial incisions were closed due to the small size and the nondilated nature of these incisions. A 4-0 Vicryl was used to close the additional laparoscopic ports sites. Good hemostasis was noted.  Cystoscopy was carried out after the patient had received IV methylene blue. No sutures were noted in the bladder. Bilateral ureters were seen peristalsing blue urine. Evaluation was done for cystocele and rectocele repair however no significant defects were noted. Allis clamps were placed both on the anterior and posterior vagina but no cystocele or rectocele could be created. A rectal examination was done which identified a thin rectovaginal septum but no significant rectocele. Decision was made to terminate the procedure.   Sponge lap and needle counts were correct x3 the patient was woken from general anesthesia having tolerated the procedure well and taken to the recovery room in a stable fashion.  Khrystyna Schwalm  A. 10/23/2016 11:55 AM

## 2016-10-23 NOTE — Transfer of Care (Signed)
Immediate Anesthesia Transfer of Care Note  Patient: Gina Williams  Procedure(s) Performed: ROBOTIC ASSISTED TOTAL HYSTERECTOMY WITH BILATERAL SALPINGO OOPHORECTOMY/Uterosacral Ligament Suspension (Bilateral Abdomen) CYSTOSCOPY (N/A Bladder)  Patient Location: PACU  Anesthesia Type:General  Level of Consciousness: awake, alert  and oriented  Airway & Oxygen Therapy: Patient Spontanous Breathing and Patient connected to nasal cannula oxygen  Post-op Assessment: Report given to RN and Post -op Vital signs reviewed and stable  Post vital signs: Reviewed and stable  Last Vitals:  Vitals:   10/23/16 0641  BP: 104/87  Pulse: 82  Resp: 16  Temp: 36.7 C  SpO2: 100%    Last Pain:  Vitals:   10/23/16 0641  TempSrc: Oral  PainSc: 2       Patients Stated Pain Goal: 3 (30/94/07 6808)  Complications: No apparent anesthesia complications

## 2016-10-24 ENCOUNTER — Encounter (HOSPITAL_COMMUNITY): Payer: Self-pay | Admitting: Obstetrics

## 2016-10-24 DIAGNOSIS — Z853 Personal history of malignant neoplasm of breast: Secondary | ICD-10-CM | POA: Diagnosis not present

## 2016-10-24 DIAGNOSIS — N814 Uterovaginal prolapse, unspecified: Secondary | ICD-10-CM | POA: Diagnosis not present

## 2016-10-24 DIAGNOSIS — Z9010 Acquired absence of unspecified breast and nipple: Secondary | ICD-10-CM | POA: Diagnosis not present

## 2016-10-24 DIAGNOSIS — N83202 Unspecified ovarian cyst, left side: Secondary | ICD-10-CM | POA: Diagnosis not present

## 2016-10-24 DIAGNOSIS — Z87891 Personal history of nicotine dependence: Secondary | ICD-10-CM | POA: Diagnosis not present

## 2016-10-24 DIAGNOSIS — D259 Leiomyoma of uterus, unspecified: Secondary | ICD-10-CM | POA: Diagnosis not present

## 2016-10-24 LAB — CBC
HCT: 34.4 % — ABNORMAL LOW (ref 36.0–46.0)
Hemoglobin: 11.3 g/dL — ABNORMAL LOW (ref 12.0–15.0)
MCH: 29.8 pg (ref 26.0–34.0)
MCHC: 32.8 g/dL (ref 30.0–36.0)
MCV: 90.8 fL (ref 78.0–100.0)
PLATELETS: 264 10*3/uL (ref 150–400)
RBC: 3.79 MIL/uL — ABNORMAL LOW (ref 3.87–5.11)
RDW: 13.7 % (ref 11.5–15.5)
WBC: 12.5 10*3/uL — ABNORMAL HIGH (ref 4.0–10.5)

## 2016-10-24 MED ORDER — OXYCODONE-ACETAMINOPHEN 5-325 MG PO TABS: 1.0000 | ORAL_TABLET | ORAL | 0 refills | Status: DC | PRN

## 2016-10-24 NOTE — Progress Notes (Signed)
Discharged home in stable condition via w/c. 

## 2016-10-24 NOTE — Discharge Summary (Signed)
Gina Williams MRN: [id number] DOB/AGE: 72/31/1947 72 y.o.  Admit date: 10/23/2016 Discharge date: 10/24/16  Admission Diagnoses: Uterine Prolapse, Cystocele, Urinary Retention  Discharge Diagnoses: Uterine Prolapse, Cystocele, Urinary Retention        Active Problems:   S/P hysterectomy   Discharged Condition: good  Hospital Course: uncomplicated surgery as planned, normal post-op course  On POD #1 pt was tolerating regular po, pain controlled, ambulating, minimal vaginal vaginal bleeding, no CP/ SOB  PE: Vitals:   10/23/16 1543 10/23/16 2014 10/23/16 2358 10/24/16 0449  BP: (!) 126/58 (!) 109/46 (!) 104/41 (!) 112/53  Pulse: 96 82 71 76  Resp: 16 16 18 18   Temp: 98.2 F (36.8 C) 98.5 F (36.9 C) 99.1 F (37.3 C) 98.8 F (37.1 C)  TempSrc: Oral Oral Oral Oral  SpO2: 96% 95% 95% 98%  Weight:      Height:       CV: RRR Pulm: CTAB Abd: soft distension, active bowel sounds,  Inc: lscope incision healing well, no erythema or separation LE: NT, no edema GU: no bleeding  CBC    Component Value Date/Time   WBC 12.5 (H) 10/24/2016 0554   RBC 3.79 (L) 10/24/2016 0554   HGB 11.3 (L) 10/24/2016 0554   HGB 13.8 05/07/2011 1332   HCT 34.4 (L) 10/24/2016 0554   HCT 41.5 05/07/2011 1332   PLT 264 10/24/2016 0554   PLT 266 05/07/2011 1332   MCV 90.8 10/24/2016 0554   MCV 88.8 05/07/2011 1332   MCH 29.8 10/24/2016 0554   MCHC 32.8 10/24/2016 0554   RDW 13.7 10/24/2016 0554   RDW 12.9 05/07/2011 1332   LYMPHSABS 2.2 12/26/2015 0916   LYMPHSABS 1.9 05/07/2011 1332   MONOABS 0.8 12/26/2015 0916   MONOABS 0.6 05/07/2011 1332   EOSABS 0.2 12/26/2015 0916   EOSABS 0.2 05/07/2011 1332   BASOSABS 0.0 12/26/2015 0916   BASOSABS 0.1 05/07/2011 1332     Consults: None  Treatments: surgery: robotic assisted TLH/BSO with uterosacral ligamant suspension and cystoscopy  Disposition: Final discharge disposition not confirmed- HOME   Allergies as of 10/24/2016      Reactions    Amoxicillin-pot Clavulanate Nausea Only   REACTION: nausea (tolerates amoxil) Has patient had a PCN reaction causing immediate rash, facial/tongue/throat swelling, SOB or lightheadedness with hypotension: No Has patient had a PCN reaction causing severe rash involving mucus membranes or skin necrosis: Unknown Has patient had a PCN reaction that required hospitalization: No Has patient had a PCN reaction occurring within the last 10 years: No If all of the above answers are "NO", then may proceed with Cephalosporin use.   Omeprazole Other (See Comments)   ineffective   Other Nausea And Vomiting   Anesthesia-vomiting/dry heaves (up to ~12hrs after)      Medication List    STOP taking these medications   conjugated estrogens vaginal cream Commonly known as:  PREMARIN     TAKE these medications   acetaminophen 500 MG tablet Commonly known as:  TYLENOL Take 500 mg by mouth every 6 (six) hours as needed (for pain.).   DIAPER RASH EX Apply 1 application topically 2 (two) times daily as needed (for irritation.).   Fish Oil 1200 MG Caps Take 1,200 mg by mouth daily with breakfast.   NEXIUM 40 MG capsule Generic drug:  esomeprazole TAKE ONE (1) CAPSULE BY MOUTH EACH DAY BEFORE BREAKFAST   oxyCODONE-acetaminophen 5-325 MG tablet Commonly known as:  PERCOCET/ROXICET Take 1-2 tablets by mouth every 4 (four)  hours as needed for severe pain (moderate to severe pain (when tolerating fluids)).   PROBIOTIC DAILY PO Take 1 tablet by mouth daily after lunch.   SYSTANE 0.4-0.3 % Soln Generic drug:  Polyethyl Glycol-Propyl Glycol Place 1-2 drops into both eyes 3 (three) times daily as needed (for dry eyes/irritation.).        Signed: Ala Dach., MD 10/24/2016, 8:11 AM

## 2016-10-24 NOTE — Progress Notes (Signed)
Discharge instructions given to patient and she verbalized understanding of all instructions given. Written copy of AVS and Rx for Percocet given to patient.

## 2016-11-21 DIAGNOSIS — R39198 Other difficulties with micturition: Secondary | ICD-10-CM | POA: Diagnosis not present

## 2016-11-21 DIAGNOSIS — N760 Acute vaginitis: Secondary | ICD-10-CM | POA: Diagnosis not present

## 2017-01-21 ENCOUNTER — Other Ambulatory Visit: Payer: Self-pay | Admitting: Family Medicine

## 2017-02-13 ENCOUNTER — Telehealth: Payer: Self-pay | Admitting: Family Medicine

## 2017-02-13 NOTE — Telephone Encounter (Signed)
Request for Flonase. Med not found.  LOV  preop  09/23/16 and 01/01/16 before that     Preferred pharmacy [facility]

## 2017-02-13 NOTE — Telephone Encounter (Signed)
Copied from [place]. Topic: Quick Communication - Rx Refill/Question >> Feb 13, 2017 12:00 PM Synthia Innocent wrote: Medication:fluticasone [facility]) 50 MCG/ACT nasal spray   Has the patient contacted their pharmacy?Yes, no refills avail   (Agent: If no, request that the patient contact the pharmacy for the refill.)   Preferred Pharmacy (with phone number or street name): [facility]   Agent: Please be advised that RX refills may take up to 3 business days. We ask that you follow-up with your pharmacy.

## 2017-02-16 MED ORDER — FLUTICASONE PROPIONATE 50 MCG/ACT NA SUSP: 2.0000 | Freq: Every day | NASAL | 3 refills | Status: DC | PRN

## 2017-02-16 NOTE — Telephone Encounter (Signed)
Last time it was prescribed was in 2013, please advise

## 2017-02-16 NOTE — Addendum Note (Signed)
Addended by: Tammi Sou on: 02/16/2017 04:54 PM   Modules accepted: Orders

## 2017-02-16 NOTE — Telephone Encounter (Signed)
Pt is calling back in regards to getting a script of this medication. Please advise.

## 2017-02-16 NOTE — Telephone Encounter (Signed)
done

## 2017-02-16 NOTE — Telephone Encounter (Signed)
Please refill for a year  

## 2017-02-24 ENCOUNTER — Other Ambulatory Visit: Payer: Self-pay

## 2017-02-24 DIAGNOSIS — I788 Other diseases of capillaries: Secondary | ICD-10-CM | POA: Diagnosis not present

## 2017-02-24 DIAGNOSIS — D229 Melanocytic nevi, unspecified: Secondary | ICD-10-CM | POA: Diagnosis not present

## 2017-02-24 DIAGNOSIS — D485 Neoplasm of uncertain behavior of skin: Secondary | ICD-10-CM | POA: Diagnosis not present

## 2017-02-24 DIAGNOSIS — L814 Other melanin hyperpigmentation: Secondary | ICD-10-CM | POA: Diagnosis not present

## 2017-02-24 DIAGNOSIS — B079 Viral wart, unspecified: Secondary | ICD-10-CM | POA: Diagnosis not present

## 2017-02-24 DIAGNOSIS — L821 Other seborrheic keratosis: Secondary | ICD-10-CM | POA: Diagnosis not present

## 2017-03-13 DIAGNOSIS — N814 Uterovaginal prolapse, unspecified: Secondary | ICD-10-CM | POA: Diagnosis not present

## 2017-03-13 DIAGNOSIS — N952 Postmenopausal atrophic vaginitis: Secondary | ICD-10-CM | POA: Diagnosis not present

## 2017-03-13 DIAGNOSIS — N810 Urethrocele: Secondary | ICD-10-CM | POA: Diagnosis not present

## 2017-03-13 DIAGNOSIS — N8111 Cystocele, midline: Secondary | ICD-10-CM | POA: Diagnosis not present

## 2017-06-17 ENCOUNTER — Telehealth: Payer: Self-pay | Admitting: Family Medicine

## 2017-06-17 DIAGNOSIS — Z0000 Encounter for general adult medical examination without abnormal findings: Secondary | ICD-10-CM

## 2017-06-17 DIAGNOSIS — R351 Nocturia: Secondary | ICD-10-CM | POA: Diagnosis not present

## 2017-06-17 DIAGNOSIS — N3941 Urge incontinence: Secondary | ICD-10-CM | POA: Diagnosis not present

## 2017-06-17 DIAGNOSIS — R350 Frequency of micturition: Secondary | ICD-10-CM | POA: Diagnosis not present

## 2017-06-17 DIAGNOSIS — E7800 Pure hypercholesterolemia, unspecified: Secondary | ICD-10-CM

## 2017-06-17 DIAGNOSIS — D509 Iron deficiency anemia, unspecified: Secondary | ICD-10-CM

## 2017-06-17 DIAGNOSIS — D649 Anemia, unspecified: Secondary | ICD-10-CM | POA: Insufficient documentation

## 2017-06-17 DIAGNOSIS — I700 Atherosclerosis of aorta: Secondary | ICD-10-CM

## 2017-06-17 NOTE — Telephone Encounter (Signed)
-----   Message from Eustace Pen, LPN sent at [phone]/32/3557  1:35 PM EDT ----- Regarding: Labs 5/30 Lab orders needed. Thank you.  Insurance:  [employer]

## 2017-06-18 ENCOUNTER — Ambulatory Visit (INDEPENDENT_AMBULATORY_CARE_PROVIDER_SITE_OTHER): Payer: Medicare Other

## 2017-06-18 VITALS — BP 110/64 | HR 87 | Temp 97.6°F | Ht 62.75 in | Wt 136.0 lb

## 2017-06-18 DIAGNOSIS — D509 Iron deficiency anemia, unspecified: Secondary | ICD-10-CM

## 2017-06-18 DIAGNOSIS — E7800 Pure hypercholesterolemia, unspecified: Secondary | ICD-10-CM

## 2017-06-18 DIAGNOSIS — Z0000 Encounter for general adult medical examination without abnormal findings: Secondary | ICD-10-CM | POA: Diagnosis not present

## 2017-06-18 LAB — CBC WITH DIFFERENTIAL/PLATELET
BASOS PCT: 1.1 % (ref 0.0–3.0)
Basophils Absolute: 0.1 10*3/uL (ref 0.0–0.1)
EOS PCT: 2.2 % (ref 0.0–5.0)
Eosinophils Absolute: 0.2 10*3/uL (ref 0.0–0.7)
HEMATOCRIT: 42.9 % (ref 36.0–46.0)
Hemoglobin: 14.3 g/dL (ref 12.0–15.0)
LYMPHS PCT: 20.2 % (ref 12.0–46.0)
Lymphs Abs: 1.8 10*3/uL (ref 0.7–4.0)
MCHC: 33.4 g/dL (ref 30.0–36.0)
MCV: 88.3 fl (ref 78.0–100.0)
MONOS PCT: 7.6 % (ref 3.0–12.0)
Monocytes Absolute: 0.7 10*3/uL (ref 0.1–1.0)
NEUTROS ABS: 6.1 10*3/uL (ref 1.4–7.7)
Neutrophils Relative %: 68.9 % (ref 43.0–77.0)
PLATELETS: 266 10*3/uL (ref 150.0–400.0)
RBC: 4.86 Mil/uL (ref 3.87–5.11)
RDW: 13.2 % (ref 11.5–15.5)
WBC: 8.8 10*3/uL (ref 4.0–10.5)

## 2017-06-18 LAB — COMPREHENSIVE METABOLIC PANEL
ALT: 12 U/L (ref 0–35)
AST: 13 U/L (ref 0–37)
Albumin: 4 g/dL (ref 3.5–5.2)
Alkaline Phosphatase: 56 U/L (ref 39–117)
BUN: 14 mg/dL (ref 6–23)
CALCIUM: 9.2 mg/dL (ref 8.4–10.5)
CHLORIDE: 107 meq/L (ref 96–112)
CO2: 28 meq/L (ref 19–32)
Creatinine, Ser: 0.89 mg/dL (ref 0.40–1.20)
GFR: 66.15 mL/min (ref >60.00)
Glucose, Bld: 82 mg/dL (ref 70–99)
Potassium: 4.3 mEq/L (ref 3.5–5.1)
Sodium: 141 mEq/L (ref 135–145)
Total Bilirubin: 0.6 mg/dL (ref 0.2–1.2)
Total Protein: 7 g/dL (ref 6.0–8.3)

## 2017-06-18 LAB — LIPID PANEL
Cholesterol: 174 mg/dL (ref 0–200)
HDL: 52.6 mg/dL (ref >39.00)
LDL CALC: 100 mg/dL — AB (ref 0–99)
NonHDL: 121.56
TRIGLYCERIDES: 106 mg/dL (ref 0.0–149.0)
Total CHOL/HDL Ratio: 3
VLDL: 21.2 mg/dL (ref 0.0–40.0)

## 2017-06-18 LAB — TSH: TSH: 1.2 u[IU]/mL (ref 0.35–4.50)

## 2017-06-18 NOTE — Progress Notes (Signed)
Subjective:   Gina Williams is a 72 y.o. female who presents for Medicare Annual (Subsequent) preventive examination.  Review of Systems:  N/A Cardiac Risk Factors include: advanced age (>11men, >66 women);dyslipidemia     Objective:     Vitals: BP 110/64 (BP Location: Left Arm, Patient Position: Sitting, Cuff Size: Normal)   Pulse 87   Temp 97.6 F (36.4 C) (Oral)   Ht 5' 2.75" (1.594 m) Comment: no shoes  Wt 136 lb (61.7 kg)   SpO2 97%   BMI 24.28 kg/m   Body mass index is 24.28 kg/m.  Advanced Directives 06/18/2017 10/23/2016 10/10/2016 12/26/2015  Does Patient Have a Medical Advance Directive? Yes Yes No No  Type of Paramedic of [place];Living will Living will - -  Does patient want to make changes to medical advance directive? - No - Patient declined - -  Copy of [place] in Chart? No - copy requested - - -    Tobacco Social History   Tobacco Use  Smoking Status Former Smoker  . Last attempt to quit: 05/21/1998  . Years since quitting: 19.0  Smokeless Tobacco Never Used     Counseling given: No   Clinical Intake:  Pre-visit preparation completed: Yes  Pain : No/denies pain Pain Score: 3      Nutritional Status: BMI 25 -29 Overweight Nutritional Risks: None Diabetes: No  How often do you need to have someone help you when you read instructions, pamphlets, or other written materials from your doctor or pharmacy?: 1 - Never What is the last grade level you completed in school?: 12th grade  Interpreter Needed?: No  Comments: pt and mother live together Information entered by :: LPinson, LPN  Past Medical History:  Diagnosis Date  . Arthritis    hands  . Breast cancer ([place]) 09/16/2007   Mastectomy  . Dyspnea    last 5 months, lack of conditioning  . GERD (gastroesophageal reflux disease)   . Osteopenia    In the past, now nl. BMD  . PONV (postoperative nausea and vomiting)    SEVERE, patient is very  concerned about this issue  . Vertigo 2007   Past Surgical History:  Procedure Laterality Date  . CATARACT EXTRACTION, BILATERAL    . COLONOSCOPY    . CYSTOSCOPY N/A 10/23/2016   Procedure: CYSTOSCOPY;  Surgeon: Aloha Gell, MD;  Location: [facility];  Service: Gynecology;  Laterality: N/A;  . GANGLION CYST EXCISION Left 1970s   Multiple surgeries  . HAND SURGERY Left 10/2005   Nerve damage  . HAND SURGERY Left 2011   Torn ligament surgery  . MASTECTOMY  8/09   Breast Cancer  . Nuclear Stress Test     Negative  EF 77%  . pap  08/05/2014   [facility]  . pessary  12/19/2015   [facility] Dr. Pamala Hurry  . ROBOTIC ASSISTED TOTAL HYSTERECTOMY WITH BILATERAL SALPINGO OOPHERECTOMY Bilateral 10/23/2016   Procedure: ROBOTIC ASSISTED TOTAL HYSTERECTOMY WITH BILATERAL SALPINGO OOPHORECTOMY/Uterosacral Ligament Suspension;  Surgeon: Aloha Gell, MD;  Location: [facility];  Service: Gynecology;  Laterality: Bilateral;  . SHOULDER SURGERY Bilateral Late 1980s   Frozen shoulders bilaterally, no surgery, had physical therapy  . Stress myoview  12/05   Low risk  . TONSILLECTOMY  Late 1970s  . TUBAL LIGATION     Family History  Problem Relation Age of Onset  . Asthma Mother   . Aortic aneurysm Mother   . Stroke Father   .  Alzheimer's disease Father   . Cancer Paternal Aunt        Breast Cancer  . Breast cancer Paternal Aunt   . Cancer Paternal Aunt        Breast Cancer  . Breast cancer Paternal Aunt   . Cancer Paternal Aunt        Breast Cancer  . Breast cancer Paternal Aunt   . Stroke Maternal Uncle   . Hypertension Other        HTN on Father's side of family   Social History   Socioeconomic History  . Marital status: Single    Spouse name: Not on file  . Number of children: Not on file  . Years of education: Not on file  . Highest education level: Not on file  Occupational History  . Not on file  Social Needs  . Financial resource strain: Not on file  . Food  insecurity:    Worry: Not on file    Inability: Not on file  . Transportation needs:    Medical: Not on file    Non-medical: Not on file  Tobacco Use  . Smoking status: Former Smoker    Last attempt to quit: 05/21/1998    Years since quitting: 19.0  . Smokeless tobacco: Never Used  Substance and Sexual Activity  . Alcohol use: No    Alcohol/week: 0.0 oz  . Drug use: No  . Sexual activity: Never    Birth control/protection: Post-menopausal  Lifestyle  . Physical activity:    Days per week: Not on file    Minutes per session: Not on file  . Stress: Not on file  Relationships  . Social connections:    Talks on phone: Not on file    Gets together: Not on file    Attends religious service: Not on file    Active member of club or organization: Not on file    Attends meetings of clubs or organizations: Not on file    Relationship status: Not on file  Other Topics Concern  . Not on file  Social History Narrative  . Not on file    Outpatient Encounter Medications as of 06/18/2017  Medication Sig  . acetaminophen (TYLENOL) 500 MG tablet Take 500 mg by mouth every 6 (six) hours as needed (for pain.).  [name] Kitchen fluticasone (FLONASE) 50 MCG/ACT nasal spray Place 2 sprays into both nostrils daily as needed for allergies or rhinitis.  [name] Kitchen NEXIUM 40 MG capsule TAKE ONE CAPSULE BY MOUTH ONCE DAILY BEFORE BREAKFAST  . Omega-3 Fatty Acids (FISH OIL) 1200 MG CAPS Take 1,200 mg by mouth daily with breakfast.  . Polyethyl Glycol-Propyl Glycol (SYSTANE) 0.4-0.3 % SOLN Place 1-2 drops into both eyes 3 (three) times daily as needed (for dry eyes/irritation.).  . [DISCONTINUED] oxyCODONE-acetaminophen (PERCOCET/ROXICET) 5-325 MG tablet Take 1-2 tablets by mouth every 4 (four) hours as needed for severe pain (moderate to severe pain (when tolerating fluids)).  . [DISCONTINUED] Probiotic Product (PROBIOTIC DAILY PO) Take 1 tablet by mouth daily after lunch.   . [DISCONTINUED] Zinc Oxide (DIAPER RASH EX) Apply 1  application topically 2 (two) times daily as needed (for irritation.).   No facility-administered encounter medications on file as of 06/18/2017.     Activities of Daily Living In your present state of health, do you have any difficulty performing the following activities: 06/18/2017 10/23/2016  Hearing? N N  Comment - -  Vision? N N  Difficulty concentrating or making decisions? Y N  Walking  or climbing stairs? Y N  Dressing or bathing? N N  Doing errands, shopping? N N  Preparing Food and eating ? N -  Using the Toilet? N -  In the past six months, have you accidently leaked urine? Y -  Do you have problems with loss of bowel control? N -  Managing your Medications? N -  Managing your Finances? N -  Housekeeping or managing your Housekeeping? N -  Some recent data might be hidden    Patient Care Team: Tower, Wynelle Fanny, MD as PCP - General Marygrace Drought, MD as Consulting Physician (Ophthalmology) Aloha Gell, MD as Consulting Physician (Obstetrics and Gynecology) Eula Listen, DDS as Referring Physician (Dentistry)    Assessment:   This is a routine wellness examination for [place].   Hearing Screening   125Hz  250Hz  500Hz  1000Hz  2000Hz  3000Hz  4000Hz  6000Hz  8000Hz   Right ear:   40 40 40  40    Left ear:   40 40 40  40    Vision Screening Comments: Vision exam in Winter 2018 with Dr. Gennaro Africa  Exercise Activities and Dietary recommendations Current Exercise Habits: The patient does not participate in regular exercise at present, Exercise limited by: None identified  Goals    . DIET - INCREASE WATER INTAKE     Starting 06/18/2017, I will continue to drink at least 50-60 ounces of water daily.        Fall Risk Fall Risk  06/18/2017 12/26/2015 12/01/2014 11/28/2013 11/03/2012  Falls in the past year? No No No No No   Depression Screen PHQ 2/9 Scores 06/18/2017 12/26/2015 12/01/2014 11/28/2013  PHQ - 2 Score 0 0 0 0  PHQ- 9 Score 0 - - -     Cognitive Function MMSE -  Mini Mental State Exam 06/18/2017 12/26/2015  Orientation to time 5 5  Orientation to Place 5 5  Registration 3 3  Attention/ Calculation 0 0  Recall 3 3  Language- name 2 objects 0 0  Language- repeat 1 1  Language- follow 3 step command 3 3  Language- read & follow direction 0 0  Write a sentence 0 0  Copy design 0 0  Total score 20 20     PLEASE NOTE: A Mini-Cog screen was completed. Maximum score is 20. A value of 0 denotes this part of [place] MMSE was not completed or the patient failed this part of the Mini-Cog screening.   Mini-Cog Screening Orientation to Time - Max 5 pts Orientation to Place - Max 5 pts Registration - Max 3 pts Recall - Max 3 pts Language Repeat - Max 1 pts Language Follow 3 Step Command - Max 3 pts     Immunization History  Administered Date(s) Administered  . Influenza,inj,Quad PF,6+ Mos 11/18/2012  . Td 06/29/2012    Screening Tests Health Maintenance  Topic Date Due  . INFLUENZA VACCINE  02/22/2020 (Originally 08/20/2017)  . PNA vac Low Risk Adult (1 of 2 - PCV13) 12/25/2025 (Originally 11/13/2009)  . MAMMOGRAM  09/17/2017  . TETANUS/TDAP  06/30/2022  . COLONOSCOPY  02/01/2023  . DEXA SCAN  Completed  . Hepatitis C Screening  Completed      Plan:     I have personally reviewed, addressed, and noted the following in the patient's chart:  A. Medical and social history B. Use of alcohol, tobacco or illicit drugs  C. Current medications and supplements D. Functional ability and status E.  Nutritional status F.  Physical activity G. Advance  directives H. List of other physicians I.  Hospitalizations, surgeries, and ER visits in previous 12 months J.  [place] to include hearing, vision, cognitive, depression L. Referrals and appointments - none  In addition, I have reviewed and discussed with patient certain preventive protocols, quality metrics, and best practice recommendations. A written personalized care plan for  preventive services as well as general preventive health recommendations were provided to patient.  See attached scanned questionnaire for additional information.   Signed,   Lindell Noe, MHA, BS, LPN Health Coach

## 2017-06-18 NOTE — Patient Instructions (Addendum)
Gina Williams , Thank you for taking time to come for your Medicare Wellness Visit. I appreciate your ongoing commitment to your health goals. Please review the following plan we discussed and let me know if I can assist you in the future.   These are the goals we discussed: Goals    . DIET - INCREASE WATER INTAKE     Starting 06/18/2017, I will continue to drink at least 50-60 ounces of water daily.        This is a list of the screening recommended for you and due dates:  Health Maintenance  Topic Date Due  . Flu Shot  02/22/2020*  . Pneumonia vaccines (1 of 2 - PCV13) 12/25/2025*  . Mammogram  09/17/2017  . Tetanus Vaccine  06/30/2022  . Colon Cancer Screening  02/01/2023  . DEXA scan (bone density measurement)  Completed  .  Hepatitis C: One time screening is recommended by Center for Disease Control  (CDC) for  adults born from [birth year] through 1966.   Completed  *Topic was postponed. The date shown is not the original due date.   Preventive Care for Adults  A healthy lifestyle and preventive care can promote health and wellness. Preventive health guidelines for adults include the following key practices.  . A routine yearly physical is a good way to check with your health care provider about your health and preventive screening. It is a chance to share any concerns and updates on your health and to receive a thorough exam.  . Visit your dentist for a routine exam and preventive care every 6 months. Brush your teeth twice a day and floss once a day. Good oral hygiene prevents tooth decay and gum disease.  . The frequency of eye exams is based on your age, health, family medical history, use  of contact lenses, and other factors. Follow your health care provider's recommendations for frequency of eye exams.  . Eat a healthy diet. Foods like vegetables, fruits, whole grains, low-fat dairy products, and lean protein foods contain the nutrients you need without too many calories.  Decrease your intake of foods high in solid fats, added sugars, and salt. Eat the right amount of calories for you. Get information about a proper diet from your health care provider, if necessary.  . Regular physical exercise is one of the most important things you can do for your health. Most adults should get at least 150 minutes of moderate-intensity exercise (any activity that increases your heart rate and causes you to sweat) each week. In addition, most adults need muscle-strengthening exercises on 2 or more days a week.  Silver Sneakers may be a benefit available to you. To determine eligibility, you may visit the website: www.silversneakers.com or contact program at (361)182-7572 Mon-Fri between 8AM-8PM.   . Maintain a healthy weight. The body mass index (BMI) is a screening tool to identify possible weight problems. It provides an estimate of body fat based on height and weight. Your health care provider can find your BMI and can help you achieve or maintain a healthy weight.   For adults 20 years and older: ? A BMI below 18.5 is considered underweight. ? A BMI of 18.5 to 24.9 is normal. ? A BMI of 25 to 29.9 is considered overweight. ? A BMI of 30 and above is considered obese.   . Maintain normal blood lipids and cholesterol levels by exercising and minimizing your intake of saturated fat. Eat a balanced diet with plenty of fruit  and vegetables. Blood tests for lipids and cholesterol should begin at age 43 and be repeated every 5 years. If your lipid or cholesterol levels are high, you are over 50, or you are at high risk for heart disease, you may need your cholesterol levels checked more frequently. Ongoing high lipid and cholesterol levels should be treated with medicines if diet and exercise are not working.  . If you smoke, find out from your health care provider how to quit. If you do not use tobacco, please do not start.  . If you choose to drink alcohol, please do not consume  more than 2 drinks per day. One drink is considered to be 12 ounces (355 mL) of beer, 5 ounces (148 mL) of wine, or 1.5 ounces (44 mL) of liquor.  . If you are 62-73 years old, ask your health care provider if you should take aspirin to prevent strokes.  . Use sunscreen. Apply sunscreen liberally and repeatedly throughout the day. You should seek shade when your shadow is shorter than you. Protect yourself by wearing long sleeves, pants, a wide-brimmed hat, and sunglasses year round, whenever you are outdoors.  . Once a month, do a whole body skin exam, using a mirror to look at the skin on your back. Tell your health care provider of new moles, moles that have irregular borders, moles that are larger than a pencil eraser, or moles that have changed in shape or color.

## 2017-06-18 NOTE — Progress Notes (Signed)
PCP notes:   Health maintenance:  No gaps identified.  Abnormal screenings:   None  Patient concerns:   Bladder prolapse - under care of urologist and is planning to prolapse repair  Nurse concerns:  None  Next PCP appt:   06/25/17 @ 1015  I reviewed health advisor's note, was available for consultation, and agree with documentation and plan. Loura Pardon MD

## 2017-06-25 ENCOUNTER — Encounter: Payer: Self-pay | Admitting: Family Medicine

## 2017-06-25 ENCOUNTER — Ambulatory Visit (INDEPENDENT_AMBULATORY_CARE_PROVIDER_SITE_OTHER): Payer: Medicare Other | Admitting: Family Medicine

## 2017-06-25 VITALS — BP 128/72 | HR 71 | Temp 98.0°F | Ht 62.75 in | Wt 135.0 lb

## 2017-06-25 DIAGNOSIS — J0100 Acute maxillary sinusitis, unspecified: Secondary | ICD-10-CM

## 2017-06-25 DIAGNOSIS — M25512 Pain in left shoulder: Secondary | ICD-10-CM | POA: Insufficient documentation

## 2017-06-25 DIAGNOSIS — I700 Atherosclerosis of aorta: Secondary | ICD-10-CM

## 2017-06-25 DIAGNOSIS — J019 Acute sinusitis, unspecified: Secondary | ICD-10-CM | POA: Insufficient documentation

## 2017-06-25 DIAGNOSIS — E7800 Pure hypercholesterolemia, unspecified: Secondary | ICD-10-CM | POA: Diagnosis not present

## 2017-06-25 DIAGNOSIS — M8589 Other specified disorders of bone density and structure, multiple sites: Secondary | ICD-10-CM

## 2017-06-25 MED ORDER — AZITHROMYCIN 250 MG PO TABS: ORAL_TABLET | ORAL | 0 refills | Status: DC

## 2017-06-25 NOTE — Assessment & Plan Note (Signed)
Disc goals for lipids and reasons to control them Rev last labs with pt Rev low sat fat diet in detail Well controlled with diet  

## 2017-06-25 NOTE — Progress Notes (Signed)
Subjective:    Patient ID: Gina Williams, female    DOB: 05/06/1945, 72 y.o.   MRN: [id number]  HPI Here for annual f/u of chronic health problems   Has a shoulder injury  Was sawing wood 6 wk ago -hurts since Is moving up into head  Wants to refer to orthopedics   L sinus (maxillary)/ear hurts -shooting sharp pains from her eye (day 3)  Hurts to breathe  Pain under eye Stuffy -nothing comes out when she blows No post nasal drip  No fever  Using fluticasone - since march/not helpful  No ST  No rash  Eye area -pain refers there but not red or draining or itchy No vision change  No tenderness on temple  No cough  Appetite is good   Wt Readings from Last 3 Encounters:  06/25/17 135 lb (61.2 kg)  06/18/17 136 lb (61.7 kg)  10/23/16 139 lb (63 kg)  good weight as usual  Eats a lot of fruits/veg-produce  Exercise -plans to get back to it   (has to watch her mother with dementia) -harder to get out  24.11 kg/m   Has a small garden- that keeps her active Cares for animals   Had amw on 5/30   Declines most vaccines- flu/pna /zoster  Had a hysterectomy in the fall  Helped for a while Now having problems with urine incontinence again- will f/u with urology  Frustrating   Mammogram 8/18 -nl  Self breast exam - no lumps  Personal hx of breast cancer with mastectomy in the past (now released from care)    1/15 colonoscopy - 10 y recall  Would consider cologuard at 5 y mark  dexa 1/17 - stable osteopenia  No  fx  Fell jan 2018 in snow  Does chop wood -for exercise  Not taking D and calcium   Not interested in another dexa yet   Mother had AAA She never checked with ins about coverage   Hyperlipidemia Lab Results  Component Value Date   CHOL 174 06/18/2017   CHOL 190 12/26/2015   CHOL 170 11/24/2014   Lab Results  Component Value Date   HDL 52.60 06/18/2017   HDL 51.00 12/26/2015   HDL 45.20 11/24/2014   Lab Results  Component Value Date   LDLCALC 100 (H) 06/18/2017   LDLCALC 118 (H) 12/26/2015   LDLCALC 108 (H) 11/24/2014   Lab Results  Component Value Date   TRIG 106.0 06/18/2017   TRIG 102.0 12/26/2015   TRIG 81.0 11/24/2014   Lab Results  Component Value Date   CHOLHDL 3 06/18/2017   CHOLHDL 4 12/26/2015   CHOLHDL 4 11/24/2014   Lab Results  Component Value Date   LDLDIRECT 156.2 10/27/2012   Eats a fairly healthy diet  Lots of produce and greens  Not much beef  No sausage or bacon   Other labs  Results for orders placed or performed in visit on 06/18/17  TSH  Result Value Ref Range   TSH 1.20 0.35 - 4.50 uIU/mL  Lipid panel  Result Value Ref Range   Cholesterol 174 0 - 200 mg/dL   Triglycerides 106.0 0.0 - 149.0 mg/dL   HDL 52.60 >39.00 mg/dL   VLDL 21.2 0.0 - 40.0 mg/dL   LDL Cholesterol 100 (H) 0 - 99 mg/dL   Total CHOL/HDL Ratio 3    NonHDL 121.56   Comprehensive metabolic panel  Result Value Ref Range   Sodium 141 135 - 145 mEq/L  Potassium 4.3 3.5 - 5.1 mEq/L   Chloride 107 96 - 112 mEq/L   CO2 28 19 - 32 mEq/L   Glucose, Bld 82 70 - 99 mg/dL   BUN 14 6 - 23 mg/dL   Creatinine, Ser 0.89 0.40 - 1.20 mg/dL   Total Bilirubin 0.6 0.2 - 1.2 mg/dL   Alkaline Phosphatase 56 39 - 117 U/L   AST 13 0 - 37 U/L   ALT 12 0 - 35 U/L   Total Protein 7.0 6.0 - 8.3 g/dL   Albumin 4.0 3.5 - 5.2 g/dL   Calcium 9.2 8.4 - 10.5 mg/dL   GFR 66.15 >60.00 mL/min  CBC with Differential/Platelet  Result Value Ref Range   WBC 8.8 4.0 - 10.5 K/uL   RBC 4.86 3.87 - 5.11 Mil/uL   Hemoglobin 14.3 12.0 - 15.0 g/dL   HCT 42.9 36.0 - 46.0 %   MCV 88.3 78.0 - 100.0 fl   MCHC 33.4 30.0 - 36.0 g/dL   RDW 13.2 11.5 - 15.5 %   Platelets 266.0 150.0 - 400.0 K/uL   Neutrophils Relative % 68.9 43.0 - 77.0 %   Lymphocytes Relative 20.2 12.0 - 46.0 %   Monocytes Relative 7.6 3.0 - 12.0 %   Eosinophils Relative 2.2 0.0 - 5.0 %   Basophils Relative 1.1 0.0 - 3.0 %   Neutro Abs 6.1 1.4 - 7.7 K/uL   Lymphs Abs 1.8 0.7  - 4.0 K/uL   Monocytes Absolute 0.7 0.1 - 1.0 K/uL   Eosinophils Absolute 0.2 0.0 - 0.7 K/uL   Basophils Absolute 0.1 0.0 - 0.1 K/uL     Patient Active Problem List   Diagnosis Date Noted  . Left shoulder pain 06/25/2017  . Acute sinus infection 06/25/2017  . Anemia 06/17/2017  . S/P hysterectomy 10/23/2016  . Pre-operative examination 09/23/2016  . Atherosclerosis of aorta ([place]) 09/23/2016  . Need for hepatitis C screening test 12/23/2015  . Routine general medical examination at a health care facility 12/01/2014  . Estrogen deficiency 12/01/2014  . Osteopenia 11/28/2013  . Colon cancer screening 11/03/2012  . Hyperlipidemia 11/03/2012  . Encounter for Medicare annual wellness exam 10/26/2012  . GERD 01/23/2010  . History of breast cancer 01/23/2010   Past Medical History:  Diagnosis Date  . Arthritis    hands  . Breast cancer ([place]) 09/16/2007   Mastectomy  . Dyspnea    last 5 months, lack of conditioning  . GERD (gastroesophageal reflux disease)   . Osteopenia    In the past, now nl. BMD  . PONV (postoperative nausea and vomiting)    SEVERE, patient is very concerned about this issue  . Vertigo 2007   Past Surgical History:  Procedure Laterality Date  . CATARACT EXTRACTION, BILATERAL    . COLONOSCOPY    . CYSTOSCOPY N/A 10/23/2016   Procedure: CYSTOSCOPY;  Surgeon: Aloha Gell, MD;  Location: [facility];  Service: Gynecology;  Laterality: N/A;  . GANGLION CYST EXCISION Left 1970s   Multiple surgeries  . HAND SURGERY Left 10/2005   Nerve damage  . HAND SURGERY Left 2011   Torn ligament surgery  . MASTECTOMY  8/09   Breast Cancer  . Nuclear Stress Test     Negative  EF 77%  . pap  08/05/2014   [facility]  . pessary  12/19/2015   [facility] Dr. Pamala Hurry  . ROBOTIC ASSISTED TOTAL HYSTERECTOMY WITH BILATERAL SALPINGO OOPHERECTOMY Bilateral 10/23/2016   Procedure: ROBOTIC ASSISTED TOTAL HYSTERECTOMY WITH  BILATERAL SALPINGO OOPHORECTOMY/Uterosacral  Ligament Suspension;  Surgeon: Aloha Gell, MD;  Location: [facility];  Service: Gynecology;  Laterality: Bilateral;  . SHOULDER SURGERY Bilateral Late 1980s   Frozen shoulders bilaterally, no surgery, had physical therapy  . Stress myoview  12/05   Low risk  . TONSILLECTOMY  Late 1970s  . TUBAL LIGATION     Social History   Tobacco Use  . Smoking status: Former Smoker    Last attempt to quit: 05/21/1998    Years since quitting: 19.1  . Smokeless tobacco: Never Used  Substance Use Topics  . Alcohol use: No    Alcohol/week: 0.0 oz  . Drug use: No   Family History  Problem Relation Age of Onset  . Asthma Mother   . Aortic aneurysm Mother   . Stroke Father   . Alzheimer's disease Father   . Cancer Paternal Aunt        Breast Cancer  . Breast cancer Paternal Aunt   . Cancer Paternal Aunt        Breast Cancer  . Breast cancer Paternal Aunt   . Cancer Paternal Aunt        Breast Cancer  . Breast cancer Paternal Aunt   . Stroke Maternal Uncle   . Hypertension Other        HTN on Father's side of family   Allergies  Allergen Reactions  . Amoxicillin-Pot Clavulanate Nausea Only    REACTION: nausea (tolerates amoxil) Has patient had a PCN reaction causing immediate rash, facial/tongue/throat swelling, SOB or lightheadedness with hypotension: No Has patient had a PCN reaction causing severe rash involving mucus membranes or skin necrosis: Unknown Has patient had a PCN reaction that required hospitalization: No Has patient had a PCN reaction occurring within the last 10 years: No If all of the above answers are "NO", then may proceed with Cephalosporin use.   [name] Kitchen Omeprazole Other (See Comments)    ineffective   . Other Nausea And Vomiting    Anesthesia-vomiting/dry heaves (up to ~12hrs after)   Current Outpatient Medications on File Prior to Visit  Medication Sig Dispense Refill  . acetaminophen (TYLENOL) 500 MG tablet Take 500 mg by mouth every 6 (six) hours as needed (for  pain.).    [name] Kitchen fluticasone (FLONASE) 50 MCG/ACT nasal spray Place 2 sprays into both nostrils daily as needed for allergies or rhinitis. 48 g 3  . mirabegron ER (MYRBETRIQ) 50 MG TB24 tablet Take 50 mg by mouth daily.    [name] Kitchen NEXIUM 40 MG capsule TAKE ONE CAPSULE BY MOUTH ONCE DAILY BEFORE BREAKFAST 90 capsule 1  . Omega-3 Fatty Acids (FISH OIL) 1200 MG CAPS Take 1,200 mg by mouth daily with breakfast.    . OVER THE COUNTER MEDICATION CBD OIL    . Polyethyl Glycol-Propyl Glycol (SYSTANE) 0.4-0.3 % SOLN Place 1-2 drops into both eyes 3 (three) times daily as needed (for dry eyes/irritation.).     No current facility-administered medications on file prior to visit.     Review of Systems  Constitutional: Negative for activity change, appetite change, fatigue, fever and unexpected weight change.  HENT: Positive for postnasal drip, sinus pressure and sinus pain. Negative for congestion, dental problem, ear pain, facial swelling, rhinorrhea and sore throat.   Eyes: Negative for pain, redness and visual disturbance.  Respiratory: Negative for cough, shortness of breath and wheezing.   Cardiovascular: Negative for chest pain and palpitations.  Gastrointestinal: Negative for abdominal pain, blood in stool, constipation and diarrhea.  Endocrine: Negative for polydipsia and polyuria.  Genitourinary: Negative for dysuria, frequency and urgency.  Musculoskeletal: Negative for arthralgias, back pain and myalgias.       Shoulder pain   Skin: Negative for pallor and rash.  Allergic/Immunologic: Negative for environmental allergies.  Neurological: Negative for dizziness, syncope and headaches.  Hematological: Negative for adenopathy. Does not bruise/bleed easily.  Psychiatric/Behavioral: Negative for decreased concentration and dysphoric mood. The patient is nervous/anxious.        Stressors/caring for her elderly mother        Objective:   Physical Exam  Constitutional: She appears well-developed and  well-nourished. No distress.  HENT:  Head: Normocephalic and atraumatic.  Right Ear: External ear normal.  Left Ear: External ear normal.  Mouth/Throat: Oropharynx is clear and moist.  Tender over L maxillary sinus focally  L ear - dicomfort to exam but no abn findings  No temporal tenderness     Eyes: Pupils are equal, round, and reactive to light. Conjunctivae and EOM are normal. No scleral icterus.  Neck: Normal range of motion. Neck supple. No JVD present. Carotid bruit is not present. No thyromegaly present.  Cardiovascular: Normal rate, regular rhythm, normal heart sounds and intact distal pulses. Exam reveals no gallop.  Pulmonary/Chest: Effort normal and breath sounds normal. No respiratory distress. She has no wheezes. She exhibits no tenderness. No breast tenderness, discharge or bleeding.  Abdominal: Soft. Bowel sounds are normal. She exhibits no distension, no abdominal bruit and no mass. There is no tenderness.  Genitourinary: No breast tenderness, discharge or bleeding.  Genitourinary Comments: R mastectomy site with no abn  L breast: Breast exam: No mass, nodules, thickening, tenderness, bulging, retraction, inflamation, nipple discharge or skin changes noted.  No axillary or clavicular LA.     Musculoskeletal: Normal range of motion. She exhibits no edema or tenderness.  Limited rom of L shoulder due to pain   Limited rom of L shoulder   Lymphadenopathy:    She has no cervical adenopathy.  Neurological: She is alert. She has normal reflexes. She displays normal reflexes. No cranial nerve deficit. She exhibits normal muscle tone. Coordination normal.  Skin: Skin is warm and dry. No rash noted. No erythema. No pallor.  Solar lentigines diffusely  Some solar aging   Psychiatric: She has a normal mood and affect.  pleasant          Assessment & Plan:   Problem List Items Addressed This Visit      Cardiovascular and Mediastinum   Atherosclerosis of aorta ([place])     With fam hx of AAA Would like to get abd [place] for screening if ins will cover= she will check into that         Respiratory   Acute sinus infection - Primary    L maxillary  Atypical -w/o drainage  Adv to watch for rash or temporal pain  Cover with zpak (pcn all)  Update if not starting to improve in a week or if worsening   Nasal saline/heat prn  Update if not starting to improve in a week or if worsening        Relevant Medications   azithromycin (ZITHROMAX Z-PAK) 250 MG tablet     Musculoskeletal and Integument   Osteopenia    Rev last dexa 2017 She declines another one yet Disc need for calcium/ vitamin D/ wt bearing exercise and bone density test every 2 y to monitor Disc safety/ fracture risk in detail  Other   Hyperlipidemia    Disc goals for lipids and reasons to control them Rev last labs with pt Rev low sat fat diet in detail Well controlled with diet       Left shoulder pain    Refer to orthopedics  for further eval /tx  Happened after sawing wood       Relevant Orders   Ambulatory referral to Orthopedic Surgery

## 2017-06-25 NOTE — Assessment & Plan Note (Addendum)
Refer to orthopedics  for further eval /tx  Happened after sawing wood

## 2017-06-25 NOTE — Assessment & Plan Note (Signed)
L maxillary  Atypical -w/o drainage  Adv to watch for rash or temporal pain  Cover with zpak (pcn all)  Update if not starting to improve in a week or if worsening   Nasal saline/heat prn  Update if not starting to improve in a week or if worsening

## 2017-06-25 NOTE — Assessment & Plan Note (Signed)
Rev last dexa 2017 She declines another one yet Disc need for calcium/ vitamin D/ wt bearing exercise and bone density test every 2 y to monitor Disc safety/ fracture risk in detail

## 2017-06-25 NOTE — Assessment & Plan Note (Signed)
With fam hx of AAA Would like to get abd [place] for screening if ins will cover= she will check into that

## 2017-06-25 NOTE — Patient Instructions (Addendum)
We will refer you to orthopedics   Don't forget to get your mammogram in august   Please add 2000 iu of vitamin D every day  It helps with bone health   If you are interested in getting an abdominal ultrasound to screen for aortic aneurysm - call your insurance and see if it is covered ( tell them your mother had one)    Take care of yourself - exercise when you can  Outdoor time is good for you-keep it up   Take the zpak for sinus infection  Try nasal saline spray  Warm/cool compresses as needed to sinus area Watch for rash  Watch for pain in temple area or vision change and let me know

## 2017-07-02 ENCOUNTER — Ambulatory Visit (INDEPENDENT_AMBULATORY_CARE_PROVIDER_SITE_OTHER): Payer: Medicare Other

## 2017-07-02 ENCOUNTER — Ambulatory Visit (INDEPENDENT_AMBULATORY_CARE_PROVIDER_SITE_OTHER): Payer: Medicare Other | Admitting: Orthopedic Surgery

## 2017-07-02 ENCOUNTER — Encounter (INDEPENDENT_AMBULATORY_CARE_PROVIDER_SITE_OTHER): Payer: Self-pay | Admitting: Orthopedic Surgery

## 2017-07-02 DIAGNOSIS — M542 Cervicalgia: Secondary | ICD-10-CM | POA: Diagnosis not present

## 2017-07-02 DIAGNOSIS — M25512 Pain in left shoulder: Secondary | ICD-10-CM | POA: Diagnosis not present

## 2017-07-02 MED ORDER — METHOCARBAMOL 500 MG PO TABS: 500.0000 mg | ORAL_TABLET | Freq: Three times a day (TID) | ORAL | 0 refills | Status: DC | PRN

## 2017-07-02 MED ORDER — PREDNISONE 5 MG (21) PO TBPK: ORAL_TABLET | ORAL | 0 refills | Status: DC

## 2017-07-04 ENCOUNTER — Encounter (INDEPENDENT_AMBULATORY_CARE_PROVIDER_SITE_OTHER): Payer: Self-pay | Admitting: Orthopedic Surgery

## 2017-07-04 NOTE — Progress Notes (Signed)
Office Visit Note   Patient: Gina Williams           Date of Birth: 02-07-1945           MRN: [id number] Visit Date: 07/02/2017 Requested by: Tower, Wynelle Fanny, MD [place], [place] [address] PCP: Abner Greenspan, MD  Subjective: Chief Complaint  Patient presents with  . Left Shoulder - Pain  . Neck - Pain    HPI: Gina Williams is a patient with left shoulder pain.  She reports pain since March when she was splitting wood for about 5 hours at a time.  Her.  States that the pain radiates to the neck and she also describes some numbness and tingling in her fingertips.  She describes a burning sensation in the shoulder blade which has moved towards the neck.  CBD oil helps.  She states the pain will occasionally radiate down to the arm.              ROS: All systems reviewed are negative as they relate to the chief complaint within the history of present illness.  Patient denies  fevers or chills.   Assessment & Plan: Visit Diagnoses:  1. Left shoulder pain, unspecified chronicity   2. Neck pain     Plan: Impression is neck and shoulder pain with some radiation and a large component of burning shoulder blade pain.  Think this likely represents left-sided radiculopathy.  Plan is Medrol Dosepak 6-day course with Robaxin.  Need MRI cervical spine to evaluate left-sided radiculopathy with likely ESI to follow.  Follow-Up Instructions: Return for after MRI.   Orders:  Orders Placed This Encounter  Procedures  . XR Shoulder Left  . XR Cervical Spine 2 or 3 views  . MR Cervical Spine w/o contrast   Meds ordered this encounter  Medications  . predniSONE (STERAPRED UNI-PAK 21 TAB) 5 MG (21) TBPK tablet    Sig: Take dosepak as directed    Dispense:  21 tablet    Refill:  0  . methocarbamol (ROBAXIN) 500 MG tablet    Sig: Take 1 tablet (500 mg total) by mouth every 8 (eight) hours as needed for muscle spasms.    Dispense:  30 tablet    Refill:  0      Procedures: No  procedures performed   Clinical Data: No additional findings.  Objective: Vital Signs: There were no vitals taken for this visit.  Physical Exam:   Constitutional: Patient appears well-developed HEENT:  Head: Normocephalic Eyes:EOM are normal Neck: Normal range of motion Cardiovascular: Normal rate Pulmonary/chest: Effort normal Neurologic: Patient is alert Skin: Skin is warm Psychiatric: Patient has normal mood and affect    Ortho Exam: Orthopedic exam demonstrates good shoulder flexion and extension but some reproduced pain with rotation of the head to the left.  Motor or sensory function to the hand is intact but slight C5-6 paresthesias present.  Reflexes symmetric bilateral biceps and triceps 0 to 1+ out of 4.  She has full active and passive shoulder range of motion with no coarse grinding or crepitus.  Strength is good to infraspinatus supraspinatus and subscap testing on the left-hand side with negative impingement signs.  No other masses lymphadenopathy or skin changes noted in that shoulder girdle region negative O'Brien's testing on the left and no AC joint tenderness to direct palpation.  Specialty Comments:  No specialty comments available.  Imaging: No results found.   PMFS History: Patient Active Problem List  Diagnosis Date Noted  . Left shoulder pain 06/25/2017  . Acute sinus infection 06/25/2017  . S/P hysterectomy 10/23/2016  . Pre-operative examination 09/23/2016  . Atherosclerosis of aorta ([place]) 09/23/2016  . Need for hepatitis C screening test 12/23/2015  . Routine general medical examination at a health care facility 12/01/2014  . Estrogen deficiency 12/01/2014  . Osteopenia 11/28/2013  . Colon cancer screening 11/03/2012  . Hyperlipidemia 11/03/2012  . Encounter for Medicare annual wellness exam 10/26/2012  . GERD 01/23/2010  . History of breast cancer 01/23/2010   Past Medical History:  Diagnosis Date  . Arthritis    hands  . Breast  cancer ([place]) 09/16/2007   Mastectomy  . Dyspnea    last 5 months, lack of conditioning  . GERD (gastroesophageal reflux disease)   . Osteopenia    In the past, now nl. BMD  . PONV (postoperative nausea and vomiting)    SEVERE, patient is very concerned about this issue  . Vertigo 2007    Family History  Problem Relation Age of Onset  . Asthma Mother   . Aortic aneurysm Mother   . Stroke Father   . Alzheimer's disease Father   . Cancer Paternal Aunt        Breast Cancer  . Breast cancer Paternal Aunt   . Cancer Paternal Aunt        Breast Cancer  . Breast cancer Paternal Aunt   . Cancer Paternal Aunt        Breast Cancer  . Breast cancer Paternal Aunt   . Stroke Maternal Uncle   . Hypertension Other        HTN on Father's side of family    Past Surgical History:  Procedure Laterality Date  . CATARACT EXTRACTION, BILATERAL    . COLONOSCOPY    . CYSTOSCOPY N/A 10/23/2016   Procedure: CYSTOSCOPY;  Surgeon: Aloha Gell, MD;  Location: [facility];  Service: Gynecology;  Laterality: N/A;  . GANGLION CYST EXCISION Left 1970s   Multiple surgeries  . HAND SURGERY Left 10/2005   Nerve damage  . HAND SURGERY Left 2011   Torn ligament surgery  . MASTECTOMY  8/09   Breast Cancer  . Nuclear Stress Test     Negative  EF 77%  . pap  08/05/2014   [facility]  . pessary  12/19/2015   [facility] Dr. Pamala Hurry  . ROBOTIC ASSISTED TOTAL HYSTERECTOMY WITH BILATERAL SALPINGO OOPHERECTOMY Bilateral 10/23/2016   Procedure: ROBOTIC ASSISTED TOTAL HYSTERECTOMY WITH BILATERAL SALPINGO OOPHORECTOMY/Uterosacral Ligament Suspension;  Surgeon: Aloha Gell, MD;  Location: [facility];  Service: Gynecology;  Laterality: Bilateral;  . SHOULDER SURGERY Bilateral Late 1980s   Frozen shoulders bilaterally, no surgery, had physical therapy  . Stress myoview  12/05   Low risk  . TONSILLECTOMY  Late 1970s  . TUBAL LIGATION     Social History   Occupational History  . Not on file  Tobacco Use   . Smoking status: Former Smoker    Last attempt to quit: 05/21/1998    Years since quitting: 19.1  . Smokeless tobacco: Never Used  Substance and Sexual Activity  . Alcohol use: No    Alcohol/week: 0.0 oz  . Drug use: No  . Sexual activity: Never    Birth control/protection: Post-menopausal

## 2017-07-07 DIAGNOSIS — N3941 Urge incontinence: Secondary | ICD-10-CM | POA: Diagnosis not present

## 2017-07-07 DIAGNOSIS — R350 Frequency of micturition: Secondary | ICD-10-CM | POA: Diagnosis not present

## 2017-07-07 DIAGNOSIS — R351 Nocturia: Secondary | ICD-10-CM | POA: Diagnosis not present

## 2017-07-15 ENCOUNTER — Ambulatory Visit
Admission: RE | Admit: 2017-07-15 | Discharge: 2017-07-15 | Disposition: A | Discharge status: Home / Self Care | Payer: Medicare Other | Source: Ambulatory Visit | Attending: Orthopedic Surgery | Admitting: Orthopedic Surgery

## 2017-07-15 DIAGNOSIS — M542 Cervicalgia: Secondary | ICD-10-CM

## 2017-07-15 DIAGNOSIS — M47812 Spondylosis without myelopathy or radiculopathy, cervical region: Secondary | ICD-10-CM | POA: Diagnosis not present

## 2017-07-15 DIAGNOSIS — M50221 Other cervical disc displacement at C4-C5 level: Secondary | ICD-10-CM | POA: Diagnosis not present

## 2017-07-17 ENCOUNTER — Other Ambulatory Visit: Payer: Self-pay | Admitting: Urology

## 2017-07-17 DIAGNOSIS — N3941 Urge incontinence: Secondary | ICD-10-CM | POA: Diagnosis not present

## 2017-07-17 DIAGNOSIS — N8111 Cystocele, midline: Secondary | ICD-10-CM | POA: Diagnosis not present

## 2017-07-17 DIAGNOSIS — R350 Frequency of micturition: Secondary | ICD-10-CM | POA: Diagnosis not present

## 2017-07-20 ENCOUNTER — Other Ambulatory Visit: Payer: Self-pay | Admitting: Family Medicine

## 2017-07-22 ENCOUNTER — Encounter (INDEPENDENT_AMBULATORY_CARE_PROVIDER_SITE_OTHER): Payer: Self-pay | Admitting: Orthopedic Surgery

## 2017-07-22 ENCOUNTER — Ambulatory Visit (INDEPENDENT_AMBULATORY_CARE_PROVIDER_SITE_OTHER): Payer: Medicare Other | Admitting: Orthopedic Surgery

## 2017-07-22 DIAGNOSIS — M542 Cervicalgia: Secondary | ICD-10-CM | POA: Diagnosis not present

## 2017-07-24 ENCOUNTER — Encounter (INDEPENDENT_AMBULATORY_CARE_PROVIDER_SITE_OTHER): Payer: Self-pay | Admitting: Orthopedic Surgery

## 2017-07-24 NOTE — Progress Notes (Signed)
Office Visit Note   Patient: Gina Williams           Date of Birth: 1945/07/05           MRN: [id number] Visit Date: 07/22/2017 Requested by: Tower, Wynelle Fanny, MD [place], [place] [address] PCP: Abner Greenspan, MD  Subjective: Chief Complaint  Patient presents with  . Neck - Follow-up    HPI: Bona is a patient with left trapezial pain.  Since I have seen her she has had an MRI scan of that neck.  It shows C4 facet arthropathy foraminal narrowing on that left-hand side.  6-day prednisone Dosepak did help earlier.  She has bladder surgery September 3.  Denies any right-sided symptoms              ROS: All systems reviewed are negative as they relate to the chief complaint within the history of present illness.  Patient denies  fevers or chills.   Assessment & Plan: Visit Diagnoses:  1. Neck pain     Plan: Impression is left-sided facet arthritis.  Plan is Medrol Dosepak 6-day course again.  She will call me in 1 month and if she is not any better I think she would do well with a epidural steroid injection in the neck.  She wants to avoid surgery if possible.  This C4 facet arthritis and foraminal narrowing matches nicely with her primarily trapezial pain.  Follow-Up Instructions: Return if symptoms worsen or fail to improve.   Orders:  No orders of the defined types were placed in this encounter.  No orders of the defined types were placed in this encounter.     Procedures: No procedures performed   Clinical Data: No additional findings.  Objective: Vital Signs: There were no vitals taken for this visit.  Physical Exam:   Constitutional: Patient appears well-developed HEENT:  Head: Normocephalic Eyes:EOM are normal Neck: Normal range of motion Cardiovascular: Normal rate Pulmonary/chest: Effort normal Neurologic: Patient is alert Skin: Skin is warm Psychiatric: Patient has normal mood and affect    Ortho Exam: Orthopedic exam  demonstrates full active and passive range of motion of the elbow shoulder and neck.  She does have pain with rotation to the left.  Reflexes symmetric.  Radial pulse intact bilaterally.  Shoulder exam demonstrates good painless range of motion but she does have some tenderness to palpation in in the left trapezial region.  Specialty Comments:  No specialty comments available.  Imaging: No results found.   PMFS History: Patient Active Problem List   Diagnosis Date Noted  . Left shoulder pain 06/25/2017  . Acute sinus infection 06/25/2017  . S/P hysterectomy 10/23/2016  . Pre-operative examination 09/23/2016  . Atherosclerosis of aorta ([place]) 09/23/2016  . Need for hepatitis C screening test 12/23/2015  . Routine general medical examination at a health care facility 12/01/2014  . Estrogen deficiency 12/01/2014  . Osteopenia 11/28/2013  . Colon cancer screening 11/03/2012  . Hyperlipidemia 11/03/2012  . Encounter for Medicare annual wellness exam 10/26/2012  . GERD 01/23/2010  . History of breast cancer 01/23/2010   Past Medical History:  Diagnosis Date  . Arthritis    hands  . Breast cancer ([place]) 09/16/2007   Mastectomy  . Dyspnea    last 5 months, lack of conditioning  . GERD (gastroesophageal reflux disease)   . Osteopenia    In the past, now nl. BMD  . PONV (postoperative nausea and vomiting)    SEVERE,  patient is very concerned about this issue  . Vertigo 2007    Family History  Problem Relation Age of Onset  . Asthma Mother   . Aortic aneurysm Mother   . Stroke Father   . Alzheimer's disease Father   . Cancer Paternal Aunt        Breast Cancer  . Breast cancer Paternal Aunt   . Cancer Paternal Aunt        Breast Cancer  . Breast cancer Paternal Aunt   . Cancer Paternal Aunt        Breast Cancer  . Breast cancer Paternal Aunt   . Stroke Maternal Uncle   . Hypertension Other        HTN on Father's side of family    Past Surgical History:  Procedure  Laterality Date  . CATARACT EXTRACTION, BILATERAL    . COLONOSCOPY    . CYSTOSCOPY N/A 10/23/2016   Procedure: CYSTOSCOPY;  Surgeon: Aloha Gell, MD;  Location: [facility];  Service: Gynecology;  Laterality: N/A;  . GANGLION CYST EXCISION Left 1970s   Multiple surgeries  . HAND SURGERY Left 10/2005   Nerve damage  . HAND SURGERY Left 2011   Torn ligament surgery  . MASTECTOMY  8/09   Breast Cancer  . Nuclear Stress Test     Negative  EF 77%  . pap  08/05/2014   [facility]  . pessary  12/19/2015   [facility] Dr. Pamala Hurry  . ROBOTIC ASSISTED TOTAL HYSTERECTOMY WITH BILATERAL SALPINGO OOPHERECTOMY Bilateral 10/23/2016   Procedure: ROBOTIC ASSISTED TOTAL HYSTERECTOMY WITH BILATERAL SALPINGO OOPHORECTOMY/Uterosacral Ligament Suspension;  Surgeon: Aloha Gell, MD;  Location: [facility];  Service: Gynecology;  Laterality: Bilateral;  . SHOULDER SURGERY Bilateral Late 1980s   Frozen shoulders bilaterally, no surgery, had physical therapy  . Stress myoview  12/05   Low risk  . TONSILLECTOMY  Late 1970s  . TUBAL LIGATION     Social History   Occupational History  . Not on file  Tobacco Use  . Smoking status: Former Smoker    Last attempt to quit: 05/21/1998    Years since quitting: 19.1  . Smokeless tobacco: Never Used  Substance and Sexual Activity  . Alcohol use: No    Alcohol/week: 0.0 oz  . Drug use: No  . Sexual activity: Never    Birth control/protection: Post-menopausal

## 2017-07-28 ENCOUNTER — Other Ambulatory Visit: Payer: Self-pay | Admitting: Family Medicine

## 2017-07-28 DIAGNOSIS — Z1231 Encounter for screening mammogram for malignant neoplasm of breast: Secondary | ICD-10-CM

## 2017-08-10 ENCOUNTER — Telehealth (INDEPENDENT_AMBULATORY_CARE_PROVIDER_SITE_OTHER): Payer: Self-pay | Admitting: Orthopedic Surgery

## 2017-08-10 MED ORDER — PREDNISONE 5 MG (21) PO TBPK: ORAL_TABLET | ORAL | 0 refills | Status: DC

## 2017-08-10 NOTE — Telephone Encounter (Signed)
IC and s/w patient. She stated she has only had 1 dosepak and that was in June. I sent in a refill for her b/c based off last OV note on 07/03 you had advised patient of the following.................  Plan: Impression is left-sided facet arthritis.  Plan is Medrol Dosepak 6-day course again.  She will call me in 1 month and if she is not any better I think she would do well with a epidural steroid injection in the neck.  She wants to avoid surgery if possible.  This C4 facet arthritis and foraminal narrowing matches nicely with her primarily trapezial pain.  She will give it another month after finishing dosepak and if not better will proceed with injection.

## 2017-08-10 NOTE — Telephone Encounter (Signed)
Ok to refill prednisone? Last OV 07/22/17.

## 2017-08-10 NOTE — Telephone Encounter (Signed)
Med refill  [facility]  [address] [place], [place] [address]   Prednisone

## 2017-08-10 NOTE — Telephone Encounter (Signed)
Okay; thanks.

## 2017-08-10 NOTE — Telephone Encounter (Signed)
IC and s/w patient. She stated she has only had 1 dosepak and that was in June. I sent in a refill for her b/c based off last OV note on 07/03 you had advised patient of the following.................  Plan:Impression is left-sided facet arthritis. Plan is Medrol Dosepak 6-day course again. She will call me in 1 month and if she is not any better I think she would do well with a epidural steroid injection in the neck. She wants to avoid surgery if possible. This C4 facet arthritis and foraminal narrowing matches nicely with her primarily trapezial pain.  She will give it another month after finishing dosepak and if not better will proceed with injection.

## 2017-08-10 NOTE — Telephone Encounter (Signed)
She has already had 2 of these Medrol Dosepaks.  I think it would be better for her to consider getting an injection in her neck

## 2017-08-27 ENCOUNTER — Telehealth (INDEPENDENT_AMBULATORY_CARE_PROVIDER_SITE_OTHER): Payer: Self-pay | Admitting: Orthopedic Surgery

## 2017-08-27 DIAGNOSIS — M542 Cervicalgia: Secondary | ICD-10-CM

## 2017-08-27 NOTE — Telephone Encounter (Signed)
I put order in for cervical ESI per Dr Forbes Cellar last office note. I advised patient did not see any contraindication to having injection prior to bladder tack surgery but she is going to check with that surgeon to make sure they are ok with her proceeding. I also will cancel her appt that was scheduled with Dr Marlou Sa since he does not perform the neck injections.

## 2017-08-27 NOTE — Telephone Encounter (Signed)
Patient called to schedule an appointment to get another injection in her neck, but wanted to know if it is okay to get the injection before her bladder surgery in September.  CB#[phone].  Thank you.

## 2017-09-11 NOTE — Progress Notes (Signed)
EKG 09-23-16 Epic

## 2017-09-11 NOTE — Patient Instructions (Signed)
MICALA SALTSMAN  09/11/2017   Your procedure is scheduled on: 09-22-17   Report to [facility] Main  Entrance    Report to admitting at 5:30AM    Call this number if you have problems the morning of surgery [phone]    Remember: Do not eat food or drink liquids :After Midnight.     Take these medicines the morning of surgery with A SIP OF WATER: TYLENOL IF NEEDED, NEXIUM                                 You may not have any metal on your body including hair pins and              piercings  Do not wear jewelry, make-up, lotions, powders or perfumes, deodorant             Do not wear nail polish.  Do not shave  48 hours prior to surgery.                Do not bring valuables to the hospital. [place].  Contacts, dentures or bridgework may not be worn into surgery.  Leave suitcase in the car. After surgery it may be brought to your room.                Please read over the following fact sheets you were given: _____________________________________________________________________             [facility] - Preparing for Surgery Before surgery, you can play an important role.  Because skin is not sterile, your skin needs to be as free of germs as possible.  You can reduce the number of germs on your skin by washing with CHG (chlorahexidine gluconate) soap before surgery.  CHG is an antiseptic cleaner which kills germs and bonds with the skin to continue killing germs even after washing. Please DO NOT use if you have an allergy to CHG or antibacterial soaps.  If your skin becomes reddened/irritated stop using the CHG and inform your nurse when you arrive at Short Stay. Do not shave (including legs and underarms) for at least 48 hours prior to the first CHG shower.  You may shave your face/neck. Please follow these instructions carefully:  1.  Shower with CHG Soap the night before surgery and the  morning of  Surgery.  2.  If you choose to wash your hair, wash your hair first as usual with your  normal  shampoo.  3.  After you shampoo, rinse your hair and body thoroughly to remove the  shampoo.                           4.  Use CHG as you would any other liquid soap.  You can apply chg directly  to the skin and wash                       Gently with a scrungie or clean washcloth.  5.  Apply the CHG Soap to your body ONLY FROM THE NECK DOWN.   Do not use on face/ open  Wound or open sores. Avoid contact with eyes, ears mouth and genitals (private parts).                       Wash face,  Genitals (private parts) with your normal soap.             6.  Wash thoroughly, paying special attention to the area where your surgery  will be performed.  7.  Thoroughly rinse your body with warm water from the neck down.  8.  DO NOT shower/wash with your normal soap after using and rinsing off  the CHG Soap.                9.  Pat yourself dry with a clean towel.            10.  Wear clean pajamas.            11.  Place clean sheets on your bed the night of your first shower and do not  sleep with pets. Day of Surgery : Do not apply any lotions/deodorants the morning of surgery.  Please wear clean clothes to the hospital/surgery center.  FAILURE TO FOLLOW THESE INSTRUCTIONS MAY RESULT IN THE CANCELLATION OF YOUR SURGERY PATIENT SIGNATURE_________________________________  NURSE SIGNATURE__________________________________  ________________________________________________________________________  WHAT IS A BLOOD TRANSFUSION? Blood Transfusion Information  A transfusion is the replacement of blood or some of its parts. Blood is made up of multiple cells which provide different functions.  Red blood cells carry oxygen and are used for blood loss replacement.  White blood cells fight against infection.  Platelets control bleeding.  Plasma helps clot blood.  Other blood products are  available for specialized needs, such as hemophilia or other clotting disorders. BEFORE THE TRANSFUSION  Who gives blood for transfusions?   Healthy volunteers who are fully evaluated to make sure their blood is safe. This is blood bank blood. Transfusion therapy is the safest it has ever been in the practice of medicine. Before blood is taken from a donor, a complete history is taken to make sure that person has no history of diseases nor engages in risky social behavior (examples are intravenous drug use or sexual activity with multiple partners). The donor's travel history is screened to minimize risk of transmitting infections, such as malaria. The donated blood is tested for signs of infectious diseases, such as HIV and hepatitis. The blood is then tested to be sure it is compatible with you in order to minimize the chance of a transfusion reaction. If you or a relative donates blood, this is often done in anticipation of surgery and is not appropriate for emergency situations. It takes many days to process the donated blood. RISKS AND COMPLICATIONS Although transfusion therapy is very safe and saves many lives, the main dangers of transfusion include:   Getting an infectious disease.  Developing a transfusion reaction. This is an allergic reaction to something in the blood you were given. Every precaution is taken to prevent this. The decision to have a blood transfusion has been considered carefully by your caregiver before blood is given. Blood is not given unless the benefits outweigh the risks. AFTER THE TRANSFUSION  Right after receiving a blood transfusion, you will usually feel much better and more energetic. This is especially true if your red blood cells have gotten low (anemic). The transfusion raises the level of the red blood cells which carry oxygen, and this usually causes an energy increase.  The  nurse administering the transfusion will monitor you carefully for  complications. HOME CARE INSTRUCTIONS  No special instructions are needed after a transfusion. You may find your energy is better. Speak with your caregiver about any limitations on activity for underlying diseases you may have. SEEK MEDICAL CARE IF:   Your condition is not improving after your transfusion.  You develop redness or irritation at the intravenous (IV) site. SEEK IMMEDIATE MEDICAL CARE IF:  Any of the following symptoms occur over the next 12 hours:  Shaking chills.  You have a temperature by mouth above 102 F (38.9 C), not controlled by medicine.  Chest, back, or muscle pain.  People around you feel you are not acting correctly or are confused.  Shortness of breath or difficulty breathing.  Dizziness and fainting.  You get a rash or develop hives.  You have a decrease in urine output.  Your urine turns a dark color or changes to pink, red, or brown. Any of the following symptoms occur over the next 10 days:  You have a temperature by mouth above 102 F (38.9 C), not controlled by medicine.  Shortness of breath.  Weakness after normal activity.  The white part of the eye turns yellow (jaundice).  You have a decrease in the amount of urine or are urinating less often.  Your urine turns a dark color or changes to pink, red, or brown. Document Released: 01/04/2000 Document Revised: 03/31/2011 Document Reviewed: 08/23/2007 [facility] Patient Information 2014 [place], [place].  _______________________________________________________________________

## 2017-09-14 ENCOUNTER — Encounter (INDEPENDENT_AMBULATORY_CARE_PROVIDER_SITE_OTHER): Payer: Self-pay | Admitting: Physical Medicine and Rehabilitation

## 2017-09-14 ENCOUNTER — Other Ambulatory Visit: Payer: Self-pay

## 2017-09-14 ENCOUNTER — Encounter (HOSPITAL_COMMUNITY): Payer: Self-pay

## 2017-09-14 ENCOUNTER — Ambulatory Visit (INDEPENDENT_AMBULATORY_CARE_PROVIDER_SITE_OTHER): Payer: Self-pay

## 2017-09-14 ENCOUNTER — Ambulatory Visit (INDEPENDENT_AMBULATORY_CARE_PROVIDER_SITE_OTHER): Payer: Medicare Other | Admitting: Physical Medicine and Rehabilitation

## 2017-09-14 ENCOUNTER — Encounter (HOSPITAL_COMMUNITY)
Admission: RE | Admit: 2017-09-14 | Discharge: 2017-09-14 | Disposition: A | Discharge status: Home / Self Care | Payer: Medicare Other | Source: Ambulatory Visit | Attending: Urology | Admitting: Urology

## 2017-09-14 VITALS — BP 134/63 | HR 90

## 2017-09-14 DIAGNOSIS — G8929 Other chronic pain: Secondary | ICD-10-CM

## 2017-09-14 DIAGNOSIS — Z01812 Encounter for preprocedural laboratory examination: Secondary | ICD-10-CM | POA: Diagnosis not present

## 2017-09-14 DIAGNOSIS — M542 Cervicalgia: Secondary | ICD-10-CM

## 2017-09-14 DIAGNOSIS — M47812 Spondylosis without myelopathy or radiculopathy, cervical region: Secondary | ICD-10-CM | POA: Diagnosis not present

## 2017-09-14 DIAGNOSIS — N816 Rectocele: Secondary | ICD-10-CM | POA: Insufficient documentation

## 2017-09-14 DIAGNOSIS — M25512 Pain in left shoulder: Secondary | ICD-10-CM

## 2017-09-14 HISTORY — DX: Other hypoglycemia: E16.1

## 2017-09-14 LAB — BASIC METABOLIC PANEL
ANION GAP: 8 (ref 5–15)
BUN: 17 mg/dL (ref 8–23)
CO2: 27 mmol/L (ref 22–32)
Calcium: 9.2 mg/dL (ref 8.9–10.3)
Chloride: 108 mmol/L (ref 98–111)
Creatinine, Ser: 0.89 mg/dL (ref 0.44–1.00)
GFR calc Af Amer: >60 mL/min (ref >60)
GLUCOSE: 145 mg/dL — AB (ref 70–99)
POTASSIUM: 5.2 mmol/L — AB (ref 3.5–5.1)
SODIUM: 143 mmol/L (ref 135–145)

## 2017-09-14 LAB — PROTIME-INR
INR: 0.85
Prothrombin Time: 11.5 seconds (ref 11.4–15.2)

## 2017-09-14 LAB — CBC
HCT: 42.9 % (ref 36.0–46.0)
HEMOGLOBIN: 14.1 g/dL (ref 12.0–15.0)
MCH: 29.8 pg (ref 26.0–34.0)
MCHC: 32.9 g/dL (ref 30.0–36.0)
MCV: 90.7 fL (ref 78.0–100.0)
Platelets: 337 10*3/uL (ref 150–400)
RBC: 4.73 MIL/uL (ref 3.87–5.11)
RDW: 12.9 % (ref 11.5–15.5)
WBC: 7.2 10*3/uL (ref 4.0–10.5)

## 2017-09-14 MED ORDER — METHYLPREDNISOLONE ACETATE 80 MG/ML IJ SUSP
80.0000 mg | Freq: Once | INTRAMUSCULAR | Status: AC
  Administered 2017-09-14: 80 mg

## 2017-09-14 NOTE — Progress Notes (Signed)
 .  Numeric Pain Rating Scale and Functional Assessment Average Pain 5   In the last MONTH (on 0-10 scale) has pain interfered with the following?  1. General activity like being  able to carry out your everyday physical activities such as walking, climbing stairs, carrying groceries, or moving a chair?  Rating(4)   +Driver, -BT, -Dye Allergies.  

## 2017-09-14 NOTE — Patient Instructions (Signed)

## 2017-09-15 LAB — ABO/RH: ABO/RH(D): O POS

## 2017-09-16 ENCOUNTER — Telehealth (INDEPENDENT_AMBULATORY_CARE_PROVIDER_SITE_OTHER): Payer: Self-pay | Admitting: *Deleted

## 2017-09-16 ENCOUNTER — Ambulatory Visit (INDEPENDENT_AMBULATORY_CARE_PROVIDER_SITE_OTHER): Payer: Medicare Other | Admitting: Orthopedic Surgery

## 2017-09-18 NOTE — H&P (Signed)
I was consulted by the above provider to assess the patient's urinary incontinence. It appears the patient had a hysterectomy last October and may have had prolapse then. In February she had a rather acute onset of vaginal bulging and urinary incontinence. She was not leaking prior to the hysterectomy and I do not think she was leaking soon after. She describes having urodynamics prior to the hysterectomy   She has urge incontinence that could be high volume. She has no control. She can soak 8 pads a day. She denies a history of stress incontinence and bedwetting   She can void every 15-30 minutes associated with urgency. She can hold it for 2 hours of sitting. She gets up once or twice a night.   She will sit and feels like she has blocked. She will then spray. She was nonspecific on her true flow   She can feel vaginal bulging. She reduces it.   It appears she had a robotic assisted hysterectomy with fixation of the cuff to the ureteral sacral ligaments using suture. It appears she had normal ureters afterwards and no cystocele or rectocele. she had a symptomatic prolapse prior to the hysterectomy and had worn a pessary for 1 year   On pelvic examination the patient had a grade 3 cystocele with moderate central defect that reached the introitus and just be on. I did not see the vaginal cuff dimples. Her vaginal vault descended from 8 or 9 cm to approximately 5 cm. There was a definite obstructive component at the level of the urethra. With the prolapse reduced she had a small grade 2 rectocele approximately mid vagina. She had no stress incontinence and minimal hypermobility the bladder neck with the prolapse reduced after cystoscopy   cystoscopy normal   The patient has high volume urgency incontinence and prolapse symptoms. She has severe frequency. She has mild nocturia. She has obstructive flow symptoms. the prolapse certainly may be the cause of her described flow symptoms. I am surprised her  bladder has become so overactive. She will return for urodynamics. If she ever had surgery she would likely best benefit from a transvaginal vault suspension with cystocele repair and graft and intraoperative if she would require rectocele repair.   at her request I gave her 6 weeks of Myrbetriq 50 mg to try to improve her quality life why we are sorting out her prolapse and incontinence   Today  Frequency and prolapse are stable  On urodynamics the patient was catheterized for 250 mL that not voided for 1 hour. Residual 1st visit was 79 mL. Maximum bladder capacity was 577 mL. Bladder was stable. The patient did note that sometimes she can leak large volumes when she has urgency if she holds it too long. She did not have stress incontinence with and without the prolapse reduced with pressures as high as 76 cm of water. During voiding she voided 325 mL with maximum flow Unknown Foley per 2nd. Maximum voiding pressure was 34 cm of water. She had a prolonged pattern. She voided with vaginal packing placed and set her flow is much better than usual. Her residual was 250 mL. EMG activity increased during the voiding phase noted. Large cystocele fluoroscopically noted. Bladder neck descended 2-3 cm. Bladder was a little bit hypo sensitive to a level of 491 mL. Details of the urodynamics are sign-in dictated   The patient I talked about a transvaginal vault suspension with cystocele repair and graft and the possibility of a rectocele repair.  We talked about a pessary and she found it uncomfortable and she had 1 for a year. She is not sexually active. We are not offering a sling but we still talked about the Novo worsening incontinence of either types. we talked about hopefully the improvement in her flow. I think it is encouraging that it will improve.   She believes the Myrbetriq is actually helping enough to stay on samples and prescription. She did not want to try a 2nd. She understands that even know the  urgency occurred simultaneously with the prolapse the surgery is not being performed to help it. I believe that there is a 10% chance or so that it will improve and we talked about medical therapy and potentially others. The prolapse is bothering her enough to proceed for it. She understands a hysterectomy itself may have caused the overactive bladder. She is not sexually active     ALLERGIES: Amoxicillin omeprazole    MEDICATIONS: Nexium  Simvastatin  Zyrtec  Atenolol  Celebrex  Centrum Silver  Co Q10  Fish Oil  Fluticasone Propionate  Lorazepam  Magnesium  Norvasc  Premarin  Sertraline Hcl  Steroid  Systane  Tylenol  Vitamin D3     GU PSH: Complex cystometrogram, w/ void pressure and urethral pressure profile studies, any technique - 07/07/2017 Complex Uroflow - 07/07/2017 Cystoscopy - 06/17/2017 Emg surf Electrd - 07/07/2017 Hysterectomy - 06/17/2016, 1988 Inject For cystogram - 07/07/2017 Intrabd voidng Press - 07/07/2017    NON-GU PSH: Breast mastectomy - 2009 Cataract surgery - 2003 Forearm Or Wrist Surgery - 2007 Hand/finger Surgery - 2007 Lung Surgery (Unspecified), pulmonary sling 2005; Revision 2014 - 2004    GU PMH: Nocturia - 06/17/2017 Urge incontinence - 06/17/2017 Urinary Frequency - 06/17/2017    NON-GU PMH: Anxiety Arthritis Breast Cancer, History Heartburn    FAMILY HISTORY: 1 Daughter - Other 1 son - Other Blood In Urine - Mother, Brother father deceased - Other lupus - Daughter mother deceased - Other Prostate Cancer - Uncle stroke - 22, Father, Uncle   SOCIAL HISTORY: Marital Status: Divorced Current Smoking Status: Patient does not smoke anymore. Has not smoked since 06/17/2017. Smoked for 20 years. Smoked 1 pack per day.   Tobacco Use Assessment Completed: Used Tobacco in last 30 days? Has never drank.  Drinks 4+ caffeinated drinks per day. Patient's occupation is/was Retired-[employer].    REVIEW OF SYSTEMS:    GU Review Female:    Patient denies frequent urination, hard to postpone urination, stream starts and stops, get up at night to urinate, leakage of urine, have to strain to urinate, burning /pain with urination, being pregnant, and trouble starting your stream.  Gastrointestinal (Upper):   Patient denies nausea, vomiting, and indigestion/ heartburn.  Gastrointestinal (Lower):   Patient denies diarrhea and constipation.  Constitutional:   Patient reports fatigue. Patient denies fever, night sweats, and weight loss.  Skin:   Patient denies skin rash/ lesion and itching.  Eyes:   Patient denies blurred vision and double vision.  Ears/ Nose/ Throat:   Patient denies sore throat and sinus problems.  Hematologic/Lymphatic:   Patient denies swollen glands and easy bruising.  Cardiovascular:   Patient denies leg swelling and chest pains.  Respiratory:   Patient reports shortness of breath. Patient denies cough.  Endocrine:   Patient denies excessive thirst.  Musculoskeletal:   Patient reports back pain. Patient denies joint pain.  Neurological:   Patient denies headaches and dizziness.  Psychologic:   Patient denies depression and  anxiety.   VITAL SIGNS: None   PAST DATA REVIEWED:  Source Of History:  Patient   PROCEDURES:          Urinalysis Dipstick Dipstick Cont'd  Color: Yellow Bilirubin: Neg  Appearance: Clear Ketones: Neg  Specific Gravity: 1.015 Blood: Neg  pH: 5.5 Protein: Neg  Glucose: Neg Urobilinogen: 0.2    Nitrites: Neg    Leukocyte Esterase: Neg    ASSESSMENT:      ICD-10 Details  1 GU:   Urge incontinence - N39.41   2   Urinary Frequency - R35.0   3   Cystocele, midline - N81.11               Notes:   I drew her a picture and we talked about prolapse surgery in detail. Pros, cons, general surgical and anesthetic risks, and other options including behavioral therapy, pessaries, and watchful waiting were discussed. She understands that prolapse repairs are successful in 80-85% of cases for  prolapse symptoms and can recur anteriorly, posteriorly, and/or apically. She understands that in most cases I use a graft and general risks were discussed. Surgical risks were described but not limited to the discussion of injury to neighboring structures including the bowel (with possible life-threatening sepsis and colostomy), bladder, urethra, vagina (all resulting in further surgery), and ureter (resulting in re-implantation). We talked about injury to nerves/soft tissue leading to debilitating and intractable pelvic, abdominal, and lower extremity pain syndromes and neuropathies. The risks of buttock pain, intractable dyspareunia, and vaginal narrowing and shortening with sequelae were discussed. Bleeding risks, transfusion rates, and infection were discussed. The risk of persistent, de novo, or worsening bladder and/or bowel incontinence/dysfunction was discussed. The need for CIC was described as well the usual post-operative course. The patient understands that she might not reach her treatment goal and that she might be worse following surgery.       PLAN:            Medications New Meds: Myrbetriq 50 mg tablet, extended release 24 hr 1 tablet PO Daily   #30  11 Refill(s)       After a thorough review of the management options for the patient's condition the patient  elected to proceed with surgical therapy as noted above. We have discussed the potential benefits and risks of the procedure, side effects of the proposed treatment, the likelihood of the patient achieving the goals of the procedure, and any potential problems that might occur during the procedure or recuperation. Informed consent has been obtained.

## 2017-09-18 NOTE — Telephone Encounter (Signed)
Not sure I have a print out, please talk to her and ask if she is talking about exercise for next etc?

## 2017-09-22 ENCOUNTER — Observation Stay (HOSPITAL_COMMUNITY)
Admission: RE | Admit: 2017-09-22 | Discharge: 2017-09-23 | Disposition: A | Discharge status: Home / Self Care | Payer: Medicare Other | Source: Ambulatory Visit | Attending: Urology | Admitting: Urology

## 2017-09-22 ENCOUNTER — Encounter (HOSPITAL_COMMUNITY): Payer: Self-pay | Admitting: Anesthesiology

## 2017-09-22 ENCOUNTER — Ambulatory Visit (HOSPITAL_COMMUNITY): Payer: Medicare Other | Admitting: Anesthesiology

## 2017-09-22 ENCOUNTER — Other Ambulatory Visit: Payer: Self-pay

## 2017-09-22 ENCOUNTER — Encounter (HOSPITAL_COMMUNITY)
Admission: RE | Disposition: A | Discharge status: Home / Self Care | Payer: Self-pay | Source: Ambulatory Visit | Attending: Urology

## 2017-09-22 DIAGNOSIS — Z87891 Personal history of nicotine dependence: Secondary | ICD-10-CM | POA: Insufficient documentation

## 2017-09-22 DIAGNOSIS — R12 Heartburn: Secondary | ICD-10-CM | POA: Insufficient documentation

## 2017-09-22 DIAGNOSIS — F419 Anxiety disorder, unspecified: Secondary | ICD-10-CM | POA: Diagnosis not present

## 2017-09-22 DIAGNOSIS — Z880 Allergy status to penicillin: Secondary | ICD-10-CM | POA: Insufficient documentation

## 2017-09-22 DIAGNOSIS — N8111 Cystocele, midline: Secondary | ICD-10-CM | POA: Diagnosis not present

## 2017-09-22 DIAGNOSIS — N816 Rectocele: Secondary | ICD-10-CM | POA: Diagnosis not present

## 2017-09-22 DIAGNOSIS — Z90710 Acquired absence of both cervix and uterus: Secondary | ICD-10-CM | POA: Diagnosis not present

## 2017-09-22 DIAGNOSIS — Z9010 Acquired absence of unspecified breast and nipple: Secondary | ICD-10-CM | POA: Insufficient documentation

## 2017-09-22 DIAGNOSIS — Z888 Allergy status to other drugs, medicaments and biological substances status: Secondary | ICD-10-CM | POA: Insufficient documentation

## 2017-09-22 DIAGNOSIS — N993 Prolapse of vaginal vault after hysterectomy: Secondary | ICD-10-CM | POA: Diagnosis not present

## 2017-09-22 DIAGNOSIS — Z853 Personal history of malignant neoplasm of breast: Secondary | ICD-10-CM | POA: Diagnosis not present

## 2017-09-22 DIAGNOSIS — M1990 Unspecified osteoarthritis, unspecified site: Secondary | ICD-10-CM | POA: Diagnosis not present

## 2017-09-22 DIAGNOSIS — Z8489 Family history of other specified conditions: Secondary | ICD-10-CM | POA: Insufficient documentation

## 2017-09-22 DIAGNOSIS — Z8042 Family history of malignant neoplasm of prostate: Secondary | ICD-10-CM | POA: Diagnosis not present

## 2017-09-22 DIAGNOSIS — Z823 Family history of stroke: Secondary | ICD-10-CM | POA: Diagnosis not present

## 2017-09-22 DIAGNOSIS — Z9849 Cataract extraction status, unspecified eye: Secondary | ICD-10-CM | POA: Insufficient documentation

## 2017-09-22 DIAGNOSIS — Z79899 Other long term (current) drug therapy: Secondary | ICD-10-CM | POA: Diagnosis not present

## 2017-09-22 DIAGNOSIS — N814 Uterovaginal prolapse, unspecified: Secondary | ICD-10-CM | POA: Diagnosis not present

## 2017-09-22 DIAGNOSIS — Z884 Allergy status to anesthetic agent status: Secondary | ICD-10-CM | POA: Diagnosis not present

## 2017-09-22 DIAGNOSIS — K219 Gastro-esophageal reflux disease without esophagitis: Secondary | ICD-10-CM | POA: Diagnosis not present

## 2017-09-22 HISTORY — PX: VAGINAL PROLAPSE REPAIR: SHX830

## 2017-09-22 HISTORY — PX: ANTERIOR AND POSTERIOR REPAIR: SHX5121

## 2017-09-22 LAB — CBC
HEMATOCRIT: 40.2 % (ref 36.0–46.0)
Hemoglobin: 13.3 g/dL (ref 12.0–15.0)
MCH: 30 pg (ref 26.0–34.0)
MCHC: 33.1 g/dL (ref 30.0–36.0)
MCV: 90.7 fL (ref 78.0–100.0)
PLATELETS: 266 10*3/uL (ref 150–400)
RBC: 4.43 MIL/uL (ref 3.87–5.11)
RDW: 12.7 % (ref 11.5–15.5)
WBC: 16.5 10*3/uL — AB (ref 4.0–10.5)

## 2017-09-22 LAB — BASIC METABOLIC PANEL
ANION GAP: 11 (ref 5–15)
BUN: 17 mg/dL (ref 8–23)
CALCIUM: 8.7 mg/dL — AB (ref 8.9–10.3)
CO2: 23 mmol/L (ref 22–32)
CREATININE: 0.91 mg/dL (ref 0.44–1.00)
Chloride: 106 mmol/L (ref 98–111)
GLUCOSE: 192 mg/dL — AB (ref 70–99)
Potassium: 4.3 mmol/L (ref 3.5–5.1)
Sodium: 140 mmol/L (ref 135–145)

## 2017-09-22 LAB — TYPE AND SCREEN
ABO/RH(D): O POS
Antibody Screen: NEGATIVE

## 2017-09-22 SURGERY — ANTERIOR (CYSTOCELE) AND POSTERIOR REPAIR (RECTOCELE)
Anesthesia: General | Site: Vagina

## 2017-09-22 MED ORDER — ROCURONIUM BROMIDE 10 MG/ML (PF) SYRINGE
PREFILLED_SYRINGE | INTRAVENOUS | Status: DC | PRN
  Administered 2017-09-22: 40 mg via INTRAVENOUS
  Administered 2017-09-22: 10 mg via INTRAVENOUS
  Administered 2017-09-22: 5 mg via INTRAVENOUS
  Administered 2017-09-22: 10 mg via INTRAVENOUS

## 2017-09-22 MED ORDER — ONDANSETRON HCL 4 MG/2ML IJ SOLN: 4.0000 mg | Freq: Once | INTRAMUSCULAR | Status: DC | PRN

## 2017-09-22 MED ORDER — PANTOPRAZOLE SODIUM 40 MG PO TBEC
40.0000 mg | DELAYED_RELEASE_TABLET | Freq: Every day | ORAL | Status: DC
  Administered 2017-09-22: 40 mg via ORAL
  Filled 2017-09-22: qty 1

## 2017-09-22 MED ORDER — LIDOCAINE 2% (20 MG/ML) 5 ML SYRINGE
INTRAMUSCULAR | Status: AC
  Filled 2017-09-22: qty 10

## 2017-09-22 MED ORDER — DEXAMETHASONE SODIUM PHOSPHATE 10 MG/ML IJ SOLN
INTRAMUSCULAR | Status: AC
  Filled 2017-09-22: qty 2

## 2017-09-22 MED ORDER — PROPOFOL 10 MG/ML IV BOLUS
INTRAVENOUS | Status: DC | PRN
  Administered 2017-09-22: 150 mg via INTRAVENOUS

## 2017-09-22 MED ORDER — FLUORESCEIN SODIUM 10 % IV SOLN
INTRAVENOUS | Status: AC
  Filled 2017-09-22: qty 5

## 2017-09-22 MED ORDER — ACETAMINOPHEN 500 MG PO TABS
1000.0000 mg | ORAL_TABLET | Freq: Once | ORAL | Status: AC
  Administered 2017-09-22: 1000 mg via ORAL
  Filled 2017-09-22: qty 2

## 2017-09-22 MED ORDER — CIPROFLOXACIN IN D5W 400 MG/200ML IV SOLN
400.0000 mg | INTRAVENOUS | Status: AC
  Administered 2017-09-22: 400 mg via INTRAVENOUS
  Filled 2017-09-22: qty 200

## 2017-09-22 MED ORDER — SUGAMMADEX SODIUM 200 MG/2ML IV SOLN
INTRAVENOUS | Status: DC | PRN
  Administered 2017-09-22: 200 mg via INTRAVENOUS

## 2017-09-22 MED ORDER — PHENAZOPYRIDINE HCL 200 MG PO TABS
200.0000 mg | ORAL_TABLET | ORAL | Status: AC
  Administered 2017-09-22: 200 mg via ORAL
  Filled 2017-09-22: qty 1

## 2017-09-22 MED ORDER — ACETAMINOPHEN 160 MG/5ML PO SOLN: 960.0000 mg | Freq: Once | ORAL | Status: AC

## 2017-09-22 MED ORDER — FENTANYL CITRATE (PF) 100 MCG/2ML IJ SOLN
INTRAMUSCULAR | Status: DC | PRN
  Administered 2017-09-22 (×4): 25 ug via INTRAVENOUS
  Administered 2017-09-22: 100 ug via INTRAVENOUS

## 2017-09-22 MED ORDER — LACTATED RINGERS IV SOLN
INTRAVENOUS | Status: DC
  Administered 2017-09-22 (×3): via INTRAVENOUS

## 2017-09-22 MED ORDER — HYDROCODONE-ACETAMINOPHEN 5-325 MG PO TABS
1.0000 | ORAL_TABLET | ORAL | Status: DC | PRN
  Administered 2017-09-22: 1 via ORAL
  Filled 2017-09-22: qty 1

## 2017-09-22 MED ORDER — SODIUM CHLORIDE 0.9 % IV SOLN
INTRAVENOUS | Status: DC | PRN
  Administered 2017-09-22: 500 mL

## 2017-09-22 MED ORDER — HYDROCODONE-ACETAMINOPHEN 5-325 MG PO TABS: 1.0000 | ORAL_TABLET | ORAL | 0 refills | Status: DC | PRN

## 2017-09-22 MED ORDER — EPHEDRINE SULFATE-NACL 50-0.9 MG/10ML-% IV SOSY
PREFILLED_SYRINGE | INTRAVENOUS | Status: DC | PRN
  Administered 2017-09-22 (×3): 5 mg via INTRAVENOUS

## 2017-09-22 MED ORDER — FLUORESCEIN SODIUM 10 % IV SOLN
INTRAVENOUS | Status: DC | PRN
  Administered 2017-09-22: 1 mL via INTRAVENOUS

## 2017-09-22 MED ORDER — SUGAMMADEX SODIUM 200 MG/2ML IV SOLN
INTRAVENOUS | Status: AC
  Filled 2017-09-22: qty 2

## 2017-09-22 MED ORDER — SCOPOLAMINE 1 MG/3DAYS TD PT72
MEDICATED_PATCH | TRANSDERMAL | Status: DC | PRN
  Administered 2017-09-22: 1 via TRANSDERMAL

## 2017-09-22 MED ORDER — PHENYLEPHRINE 40 MCG/ML (10ML) SYRINGE FOR IV PUSH (FOR BLOOD PRESSURE SUPPORT)
PREFILLED_SYRINGE | INTRAVENOUS | Status: AC
  Filled 2017-09-22: qty 10

## 2017-09-22 MED ORDER — FENTANYL CITRATE (PF) 100 MCG/2ML IJ SOLN: 25.0000 ug | INTRAMUSCULAR | Status: DC | PRN

## 2017-09-22 MED ORDER — PHENYLEPHRINE 40 MCG/ML (10ML) SYRINGE FOR IV PUSH (FOR BLOOD PRESSURE SUPPORT)
PREFILLED_SYRINGE | INTRAVENOUS | Status: DC | PRN
  Administered 2017-09-22 (×5): 80 ug via INTRAVENOUS

## 2017-09-22 MED ORDER — ONDANSETRON HCL 4 MG/2ML IJ SOLN
INTRAMUSCULAR | Status: DC | PRN
  Administered 2017-09-22: 4 mg via INTRAVENOUS

## 2017-09-22 MED ORDER — LIDOCAINE-EPINEPHRINE (PF) 1 %-1:200000 IJ SOLN
INTRAMUSCULAR | Status: DC | PRN
  Administered 2017-09-22: 20 mL

## 2017-09-22 MED ORDER — CLINDAMYCIN PHOSPHATE 900 MG/50ML IV SOLN
900.0000 mg | INTRAVENOUS | Status: AC
  Administered 2017-09-22: 900 mg via INTRAVENOUS
  Filled 2017-09-22: qty 50

## 2017-09-22 MED ORDER — SODIUM CHLORIDE 0.9 % IV SOLN
INTRAVENOUS | Status: AC
  Filled 2017-09-22 (×2): qty 500000

## 2017-09-22 MED ORDER — DEXAMETHASONE SODIUM PHOSPHATE 10 MG/ML IJ SOLN
INTRAMUSCULAR | Status: DC | PRN
  Administered 2017-09-22: 10 mg via INTRAVENOUS

## 2017-09-22 MED ORDER — FENTANYL CITRATE (PF) 100 MCG/2ML IJ SOLN
INTRAMUSCULAR | Status: AC
  Filled 2017-09-22: qty 2

## 2017-09-22 MED ORDER — CLINDAMYCIN PHOSPHATE 2 % VA CREA
TOPICAL_CREAM | VAGINAL | Status: AC
  Filled 2017-09-22: qty 80

## 2017-09-22 MED ORDER — LIDOCAINE 2% (20 MG/ML) 5 ML SYRINGE
INTRAMUSCULAR | Status: DC | PRN
  Administered 2017-09-22: 60 mg via INTRAVENOUS

## 2017-09-22 MED ORDER — CELECOXIB 200 MG PO CAPS
200.0000 mg | ORAL_CAPSULE | Freq: Once | ORAL | Status: AC | PRN
  Administered 2017-09-22: 200 mg via ORAL
  Filled 2017-09-22: qty 1

## 2017-09-22 MED ORDER — MIDAZOLAM HCL 5 MG/5ML IJ SOLN
INTRAMUSCULAR | Status: DC | PRN
  Administered 2017-09-22: 2 mg via INTRAVENOUS

## 2017-09-22 MED ORDER — ROCURONIUM BROMIDE 10 MG/ML (PF) SYRINGE
PREFILLED_SYRINGE | INTRAVENOUS | Status: AC
  Filled 2017-09-22: qty 20

## 2017-09-22 MED ORDER — ONDANSETRON HCL 4 MG/2ML IJ SOLN
4.0000 mg | INTRAMUSCULAR | Status: DC | PRN
  Administered 2017-09-22: 4 mg via INTRAVENOUS
  Filled 2017-09-22: qty 2

## 2017-09-22 MED ORDER — SCOPOLAMINE 1 MG/3DAYS TD PT72
MEDICATED_PATCH | TRANSDERMAL | Status: AC
  Filled 2017-09-22: qty 1

## 2017-09-22 MED ORDER — CLINDAMYCIN PHOSPHATE 2 % VA CREA
TOPICAL_CREAM | VAGINAL | Status: DC | PRN
  Administered 2017-09-22: 1 via VAGINAL

## 2017-09-22 MED ORDER — LIDOCAINE-EPINEPHRINE 1 %-1:100000 IJ SOLN
INTRAMUSCULAR | Status: AC
  Filled 2017-09-22: qty 2

## 2017-09-22 MED ORDER — PROPOFOL 10 MG/ML IV BOLUS
INTRAVENOUS | Status: AC
  Filled 2017-09-22: qty 40

## 2017-09-22 MED ORDER — ONDANSETRON HCL 4 MG/2ML IJ SOLN
INTRAMUSCULAR | Status: AC
  Filled 2017-09-22: qty 4

## 2017-09-22 MED ORDER — EPHEDRINE 5 MG/ML INJ
INTRAVENOUS | Status: AC
  Filled 2017-09-22: qty 20

## 2017-09-22 MED ORDER — MIDAZOLAM HCL 2 MG/2ML IJ SOLN
INTRAMUSCULAR | Status: AC
  Filled 2017-09-22: qty 2

## 2017-09-22 MED ORDER — METHOCARBAMOL 500 MG PO TABS: 500.0000 mg | ORAL_TABLET | Freq: Three times a day (TID) | ORAL | Status: DC | PRN

## 2017-09-22 MED ORDER — 0.9 % SODIUM CHLORIDE (POUR BTL) OPTIME
TOPICAL | Status: DC | PRN
  Administered 2017-09-22: 1000 mL

## 2017-09-22 SURGICAL SUPPLY — 48 items
ALLOGRAFT TUTOPLAST AXIS 6X12 (Tissue) ×2 IMPLANT
BAG URINE DRAINAGE (UROLOGICAL SUPPLIES) ×2 IMPLANT
BLADE SURG 15 STRL LF DISP TIS (BLADE) ×2 IMPLANT
BLADE SURG 15 STRL SS (BLADE) ×4
CATH FOLEY 2WAY SLVR  5CC 14FR (CATHETERS) ×2
CATH FOLEY 2WAY SLVR 5CC 14FR (CATHETERS) ×2 IMPLANT
COVER MAYO STAND STRL (DRAPES) ×2 IMPLANT
DEVICE CAPIO SLIM SINGLE (INSTRUMENTS) ×4 IMPLANT
DRAIN PENROSE 18X1/4 LTX STRL (WOUND CARE) ×4 IMPLANT
DRAPE SHEET LG 3/4 BI-LAMINATE (DRAPES) ×4 IMPLANT
ELECT PENCIL ROCKER SW 15FT (MISCELLANEOUS) ×4 IMPLANT
GAUZE 4X4 16PLY RFD (DISPOSABLE) ×10 IMPLANT
GAUZE PACKING 1 X5 YD ST (GAUZE/BANDAGES/DRESSINGS) ×2 IMPLANT
GAUZE PACKING 2X5 YD STRL (GAUZE/BANDAGES/DRESSINGS) ×2 IMPLANT
GLOVE BIO SURGEON STRL SZ 6.5 (GLOVE) ×2 IMPLANT
GLOVE BIO SURGEONS STRL SZ 6.5 (GLOVE)
GLOVE BIOGEL M STRL SZ7.5 (GLOVE) ×4 IMPLANT
GLOVE ECLIPSE 8.5 STRL (GLOVE) ×4 IMPLANT
GOWN STRL REUS W/TWL XL LVL3 (GOWN DISPOSABLE) ×4 IMPLANT
HEMOSTAT SURGICEL 4X8 (HEMOSTASIS) ×2 IMPLANT
HOLDER FOLEY CATH W/STRAP (MISCELLANEOUS) ×4 IMPLANT
KIT BASIN OR (CUSTOM PROCEDURE TRAY) ×4 IMPLANT
NDL MAYO 6 CRC TAPER PT (NEEDLE) ×2 IMPLANT
NEEDLE HYPO 22GX1.5 SAFETY (NEEDLE) ×2 IMPLANT
NEEDLE MAYO 6 CRC TAPER PT (NEEDLE) ×4 IMPLANT
NS IRRIG 1000ML POUR BTL (IV SOLUTION) ×4 IMPLANT
PACK CYSTO (CUSTOM PROCEDURE TRAY) ×2 IMPLANT
PLUG CATH AND CAP STER (CATHETERS) ×4 IMPLANT
RETRACTOR STAY HOOK 5MM (MISCELLANEOUS) ×4 IMPLANT
SHEET LAVH (DRAPES) ×4 IMPLANT
SUT CAPIO ETHIBPND (SUTURE) ×4 IMPLANT
SUT VIC AB 0 CT1 27 (SUTURE)
SUT VIC AB 0 CT1 27XBRD ANTBC (SUTURE) ×2 IMPLANT
SUT VIC AB 2-0 CT1 27 (SUTURE) ×8
SUT VIC AB 2-0 CT1 27XBRD (SUTURE) ×4 IMPLANT
SUT VIC AB 2-0 SH 27 (SUTURE) ×12
SUT VIC AB 2-0 SH 27X BRD (SUTURE) ×4 IMPLANT
SUT VIC AB 3-0 SH 27 (SUTURE) ×8
SUT VIC AB 3-0 SH 27XBRD (SUTURE) ×4 IMPLANT
SUT VICRYL 0 UR6 27IN ABS (SUTURE) ×8 IMPLANT
SYR 10ML LL (SYRINGE) ×4 IMPLANT
TOWEL OR 17X26 10 PK STRL BLUE (TOWEL DISPOSABLE) ×4 IMPLANT
TOWEL OR NON WOVEN STRL DISP B (DISPOSABLE) ×4 IMPLANT
TUBING CONNECTING 10 (TUBING) ×3 IMPLANT
TUBING CONNECTING 10' (TUBING) ×1
TUTOPLAST AXIS 6X12 (Tissue) ×4 IMPLANT
WATER STERILE IRR 1000ML POUR (IV SOLUTION) ×4 IMPLANT
YANKAUER SUCT BULB TIP 10FT TU (MISCELLANEOUS) ×4 IMPLANT

## 2017-09-22 NOTE — Anesthesia Postprocedure Evaluation (Signed)
Anesthesia Post Note  Patient: Gina Williams  Procedure(s) Performed: CYSTOSCOPY ANTERIOR (CYSTOCELE) (N/A Vagina ) VAGINAL VAULT SUSPENSION AND GRAFT (N/A )     Patient location during evaluation: PACU Anesthesia Type: General Level of consciousness: awake and alert Pain management: pain level controlled Vital Signs Assessment: post-procedure vital signs reviewed and stable Respiratory status: spontaneous breathing, nonlabored ventilation, respiratory function stable and patient connected to nasal cannula oxygen Cardiovascular status: blood pressure returned to baseline and stable Postop Assessment: no apparent nausea or vomiting Anesthetic complications: no    Last Vitals:  Vitals:   09/22/17 1126 09/22/17 2017  BP: 138/67 (!) 121/52  Pulse: 87 85  Resp: 12 16  Temp: 36.7 C 36.8 C  SpO2: 95% 93%    Last Pain:  Vitals:   09/22/17 2048  TempSrc:   PainSc: 0-No pain                 Cleopha Indelicato P Rashidi Loh

## 2017-09-22 NOTE — Interval H&P Note (Signed)
History and Physical Interval Note:  09/22/2017 7:11 AM  Gina Williams  has presented today for surgery, with the diagnosis of CYSTOCELE RECTOCELE VAULT PROLAPSE  The various methods of treatment have been discussed with the patient and family. After consideration of risks, benefits and other options for treatment, the patient has consented to  Procedure(s): CYSTOSCOPY ANTERIOR (CYSTOCELE) AND POSSIBLE POSTERIOR REPAIR (RECTOCELE) (N/A) VAGINAL VAULT SUSPENSION AND GRAFT (N/A) as a surgical intervention .  The patient's history has been reviewed, patient examined, no change in status, stable for surgery.  I have reviewed the patient's chart and labs.  Questions were answered to the patient's satisfaction.     [name]

## 2017-09-22 NOTE — Progress Notes (Signed)
4 th floor aware pt in room in 20 minutes

## 2017-09-22 NOTE — Anesthesia Procedure Notes (Signed)
Procedure Name: Intubation Date/Time: 09/22/2017 7:37 AM Performed by: Lavina Hamman, CRNA Pre-anesthesia Checklist: Patient identified, Emergency Drugs available, Suction available, Patient being monitored and Timeout performed Patient Re-evaluated:Patient Re-evaluated prior to induction Oxygen Delivery Method: Circle system utilized Preoxygenation: Pre-oxygenation with 100% oxygen Induction Type: IV induction Ventilation: Mask ventilation without difficulty Laryngoscope Size: Mac and 3 Grade View: Grade II Tube type: Oral Tube size: 7.0 mm Number of attempts: 1 Airway Equipment and Method: Stylet Placement Confirmation: ETT inserted through vocal cords under direct vision,  positive ETCO2,  CO2 detector and breath sounds checked- equal and bilateral Secured at: 21 cm Tube secured with: Tape Dental Injury: Teeth and Oropharynx as per pre-operative assessment

## 2017-09-22 NOTE — Telephone Encounter (Signed)
Called pt and left vm #2 on home and mobile.

## 2017-09-22 NOTE — Anesthesia Preprocedure Evaluation (Addendum)
Anesthesia Evaluation  Patient identified by MRN, date of birth, ID band Patient awake    Reviewed: Allergy & Precautions, NPO status , Patient's Chart, lab work & pertinent test results  History of Anesthesia Complications (+) PONV and history of anesthetic complications  Airway Mallampati: II  TM Distance: >3 FB Neck ROM: Full    Dental  (+) Upper Dentures   Pulmonary former smoker,    Pulmonary exam normal breath sounds clear to auscultation       Cardiovascular negative cardio ROS Normal cardiovascular exam Rhythm:Regular Rate:Normal  ECG: SR, rate 70   Neuro/Psych negative neurological ROS  negative psych ROS   GI/Hepatic Neg liver ROS, GERD  Medicated and Controlled,  Endo/Other  negative endocrine ROS  Renal/GU negative Renal ROS     Musculoskeletal negative musculoskeletal ROS (+)   Abdominal   Peds  Hematology negative hematology ROS (+)   Anesthesia Other Findings CYSTOCELE  RECTOCELE  VAULT PROLAPSE  Reproductive/Obstetrics                            Anesthesia Physical Anesthesia Plan  ASA: II  Anesthesia Plan: General   Post-op Pain Management:    Induction: Intravenous  PONV Risk Score and Plan: 4 or greater and Midazolam, Dexamethasone, Ondansetron and Treatment may vary due to age or medical condition  Airway Management Planned: Oral ETT  Additional Equipment:   Intra-op Plan:   Post-operative Plan: Extubation in OR  Informed Consent: I have reviewed the patients History and Physical, chart, labs and discussed the procedure including the risks, benefits and alternatives for the proposed anesthesia with the patient or authorized representative who has indicated his/her understanding and acceptance.   Dental advisory given  Plan Discussed with: CRNA  Anesthesia Plan Comments: ([place])        Anesthesia Quick Evaluation

## 2017-09-22 NOTE — Transfer of Care (Signed)
Immediate Anesthesia Transfer of Care Note  Patient: Gina Williams  Procedure(s) Performed: Procedure(s): CYSTOSCOPY ANTERIOR (CYSTOCELE) (N/A) VAGINAL VAULT SUSPENSION AND GRAFT (N/A)  Patient Location: PACU  Anesthesia Type:General  Level of Consciousness:  sedated, patient cooperative and responds to stimulation  Airway & Oxygen Therapy:Patient Spontanous Breathing and Patient connected to face mask oxgen  Post-op Assessment:  Report given to PACU RN and Post -op Vital signs reviewed and stable  Post vital signs:  Reviewed and stable  Last Vitals:  Vitals:   09/22/17 0554  BP: (!) 131/58  Pulse: 78  Resp: 16  Temp: 36.8 C  SpO2: 80%    Complications: No apparent anesthesia complications

## 2017-09-22 NOTE — Op Note (Signed)
Preoperative diagnosis: Cystocele and vault prolapse and small rectocele Postoperative diagnosis: Cystocele and vault prolapse and small rectocele Surgeon: Dr. Nicki Reaper MacDarmid Surgery: Vault prolapse repair and cystocele repair and graft and cystoscopy  The patient has the above diagnosis and consented to the above procedure.  Extra care was taken with leg positioning to minimize the risk of compartment syndrome and neuropathy and deep vein thrombosis  As it was in the office it was a little bit challenging to mark the vaginal cuff due to the mild cuff dimples.  3-0 Vicryl was placed.  She had a small grade 3 cystocele and I could pull the cuff down to the introitus.  When I would reduce the cuff and cystocele she had minimal rectocele.  I instilled 20 cc of the lidocaine epinephrine mixture.  I made my usual T-shaped anterior vaginal wall incision with Allis clamps.  I sharply mobilized the overlying vaginal epithelium from the underlying pubocervical fascia to the white line bilaterally.  I was pleased with the mobilization at the apex.  She only had a mild to modest anterior defect.  I did an anterior repair with running 2-0 Vicryl not imbricating the bladder neck.  I did not distort the anatomy.  I kept the repair very lengthy.  The patient underwent cystoscopy.  Cystoscopically she had an excellent anterior repair.  She had no distortion of the ureteral orifices.  She had excellent efflux bilaterally of dye  With the bladder emptied along the appropriate plane I finger dissected to the ischial spine bilaterally.  I mobilized tissue medially bilaterally.  She did not have a strong and only mildly obvious sacral spinous ligament.  It was somewhat soft.  Using a Capio device I placed a 0 Ethibond 1 fingerbreadth medial to the spine in a straight line between the spines.  I triple checked in the position.  I did a digital rectal examination.  The sutures were in excellent position.  There is no  rectal injury.  I dissected appropriately at the urethrovesical angle and passed a 0 Vicryl on a UR 6 needle at that level into the pelvic side wall.   I prepared a 10 x 6 cm dermal graft in the shape of the trapezoid and sewed it within the 4 sutures not under tension.  I trimmed an appropriate of anterior vaginal wall and closed anterior vaginal wall with running 2-0 Vicryl on an SH needle.    The graft laid in very nicely.  She had excellent length anteriorly and apically.  She had excellent reduction anteriorly.  She had a mild rectocele with diffuse weakness and mobility of the posterior vaginal wall as well as mobility of the perineal body with very little strength.  I did not feel there was benefit to a posterior repair based on the findings and it would not address the mobility issue.  Visually at rest she almost had no rectocele whatsoever  Irrigation was utilized.  Vaginal pack with clindamycin cream utilized.  Blood loss was less than 100 mL. leg position was excellent.  Urine output was excellent.  I was very pleased with the surgery and hopefully will reach her treatment goal

## 2017-09-23 ENCOUNTER — Ambulatory Visit (HOSPITAL_COMMUNITY): Admission: RE | Admit: 2017-09-23 | Payer: Medicare Other | Source: Ambulatory Visit | Admitting: Cardiology

## 2017-09-23 ENCOUNTER — Encounter (HOSPITAL_COMMUNITY)
Admission: RE | Disposition: A | Discharge status: Home / Self Care | Payer: Self-pay | Source: Ambulatory Visit | Attending: Urology

## 2017-09-23 ENCOUNTER — Encounter (HOSPITAL_COMMUNITY): Payer: Self-pay | Admitting: Urology

## 2017-09-23 DIAGNOSIS — Z888 Allergy status to other drugs, medicaments and biological substances status: Secondary | ICD-10-CM | POA: Diagnosis not present

## 2017-09-23 DIAGNOSIS — Z884 Allergy status to anesthetic agent status: Secondary | ICD-10-CM | POA: Diagnosis not present

## 2017-09-23 DIAGNOSIS — Z90710 Acquired absence of both cervix and uterus: Secondary | ICD-10-CM | POA: Diagnosis not present

## 2017-09-23 DIAGNOSIS — Z880 Allergy status to penicillin: Secondary | ICD-10-CM | POA: Diagnosis not present

## 2017-09-23 DIAGNOSIS — N816 Rectocele: Secondary | ICD-10-CM | POA: Diagnosis not present

## 2017-09-23 DIAGNOSIS — N8111 Cystocele, midline: Secondary | ICD-10-CM | POA: Diagnosis not present

## 2017-09-23 LAB — HEMOGLOBIN AND HEMATOCRIT, BLOOD
HCT: 39.5 % (ref 36.0–46.0)
Hemoglobin: 12.9 g/dL (ref 12.0–15.0)

## 2017-09-23 LAB — BASIC METABOLIC PANEL
Anion gap: 7 (ref 5–15)
BUN: 15 mg/dL (ref 8–23)
CHLORIDE: 110 mmol/L (ref 98–111)
CO2: 27 mmol/L (ref 22–32)
Calcium: 9 mg/dL (ref 8.9–10.3)
Creatinine, Ser: 0.74 mg/dL (ref 0.44–1.00)
GFR calc non Af Amer: >60 mL/min (ref >60)
Glucose, Bld: 110 mg/dL — ABNORMAL HIGH (ref 70–99)
POTASSIUM: 4.3 mmol/L (ref 3.5–5.1)
SODIUM: 144 mmol/L (ref 135–145)

## 2017-09-23 SURGERY — RIGHT/LEFT HEART CATH AND CORONARY/GRAFT ANGIOGRAPHY
Anesthesia: LOCAL

## 2017-09-23 NOTE — Discharge Summary (Signed)
Date of admission: 09/22/2017  Date of discharge: 09/23/2017  Admission diagnosis: Cystocele midline  Discharge diagnosis: Cystocele midline  Secondary diagnoses: Vault prolaspe  History and Physical: For full details, please see admission history and physical. Briefly, Gina Williams is a 72 y.o. year old patient with the above diagnosis.   Hospital Course: Vault prolapse repair and cystocele repair. Excellent post op course  Laboratory values:  Recent Labs    09/22/17 1554 09/23/17 0443  HGB 13.3 12.9  HCT 40.2 39.5   Recent Labs    09/22/17 1554 09/23/17 0443  CREATININE 0.91 0.74    Disposition: Home  Discharge instruction: The patient was instructed to be ambulatory but told to refrain from heavy lifting, strenuous activity, or driving. Detailed  Discharge medications:  Allergies as of 09/23/2017      Reactions   Amoxicillin-pot Clavulanate Nausea Only, Other (See Comments)   REACTION: nausea (tolerates amoxil) Has patient had a PCN reaction causing immediate rash, facial/tongue/throat swelling, SOB or lightheadedness with hypotension: No Has patient had a PCN reaction causing severe rash involving mucus membranes or skin necrosis: Unknown Has patient had a PCN reaction that required hospitalization: No Has patient had a PCN reaction occurring within the last 10 years: No If all of the above answers are "NO", then may proceed with Cephalosporin use.   Omeprazole Other (See Comments)   ineffective   Other Nausea And Vomiting, Other (See Comments)   Anesthesia-vomiting/dry heaves (up to ~12hrs after)      Medication List    TAKE these medications   acetaminophen 500 MG tablet Commonly known as:  TYLENOL Take 500 mg by mouth every 6 (six) hours as needed for moderate pain or headache.   fluticasone 50 MCG/ACT nasal spray Commonly known as:  FLONASE Place 2 sprays into both nostrils daily as needed for allergies or rhinitis.   HYDROcodone-acetaminophen 5-325 MG  tablet Commonly known as:  NORCO/VICODIN Take 1-2 tablets by mouth every 4 (four) hours as needed for moderate pain.   ibuprofen 200 MG tablet Commonly known as:  ADVIL,MOTRIN Take 200 mg by mouth every 6 (six) hours as needed for headache or moderate pain.   methocarbamol 500 MG tablet Commonly known as:  ROBAXIN Take 1 tablet (500 mg total) by mouth every 8 (eight) hours as needed for muscle spasms.   NEXIUM 40 MG capsule Generic drug:  esomeprazole TAKE 1 CAPSULE BY MOUTH ONCE DAILY BEFORE BREAKFAST What changed:    how much to take  how to take this  when to take this  additional instructions   OVER THE COUNTER MEDICATION Apply 1 application topically 3 (three) times a week. CBS Cream       Followup:  Follow-up Information    Kirkland Figg, Nicki Reaper, MD Follow up.   Specialty:  Urology Contact information: [address] [place] [address] [phone]

## 2017-09-23 NOTE — Progress Notes (Signed)
Labs and vitals good Reviewed surgery/urge incontinence etc Trial of void Post op detailed  Dc home

## 2017-09-23 NOTE — Progress Notes (Signed)
Pt foley and vag packing discontinued per MD ordered, tolerated procedure well, due to void. SRP,RN

## 2017-09-23 NOTE — Discharge Instructions (Signed)
I have reviewed discharge instructions in detail with the patient. They will follow-up with me or their physician as scheduled. My nurse will also be calling the patients as per protocol.   

## 2017-09-23 NOTE — Progress Notes (Signed)
Pt ready for discharge, transported by wheelchair in stable condition. SRP, RN

## 2017-09-23 NOTE — Progress Notes (Signed)
Pt discharged to home, instructions reviewed with patient,  acknowledged understanding of instructions. SRP, RN 

## 2017-09-24 NOTE — Telephone Encounter (Signed)
Per Dr. Ernestina Patches print out of cervical exercise were mailed to pt.

## 2017-09-28 NOTE — Progress Notes (Signed)
Gina Williams - 72 y.o. female MRN [id number]  Date of birth: 1945-05-24  Office Visit Note: Visit Date: 09/14/2017 PCP: Abner Greenspan, MD Referred by: Tower, Wynelle Fanny, MD  Subjective: Chief Complaint  Gina Williams presents with  . Neck - Pain  . Left Shoulder - Pain  . Left Hand - Numbness   HPI: Gina Williams is a 72 year old right-hand-dominant female with chronic worsening for months of axial left-sided neck pain referring into the shoulder.  Worse with rotation and consistent with facet joint arthritis.  MRI shows edematous facet joint on the left at C3-4.  We are going to complete a left C3-4 facet joint block.  Gina Williams also has nondermatomal numbness in the left hand which is all of the fingers except the thumb.  If this were to be cervical spine related it would be the lower cervical roots and Gina Williams really has nothing on MRI that would explain this.  Depending on relief Dr. Marlou Sa may wish to have her looked at with electrodiagnostic study.   ROS Otherwise per HPI.  Assessment & Plan: Visit Diagnoses:  1. Cervicalgia   2. Cervical spondylosis without myelopathy   3. Chronic left shoulder pain     Plan: No additional findings.   Meds & Orders:  Meds ordered this encounter  Medications  . methylPREDNISolone acetate (DEPO-MEDROL) injection 80 mg    Orders Placed This Encounter  Procedures  . Facet Injection  . XR C-ARM NO REPORT    Follow-up: Return if symptoms worsen or fail to improve.   Procedures: No procedures performed  Cervical Facet Joint Intra-Articular Injection with Fluoroscopic Guidance  Gina Williams: Gina Williams      Date of Birth: 1947/05-03 MRN: [id number] PCP: Abner Greenspan, MD      Visit Date: 09/14/2017   Universal Protocol:    Date/Time: 09/09/195:21 AM  Consent Given By: the Gina Williams  Position: PRONE  Additional Comments: Vital signs were monitored before and after the procedure. Gina Williams was prepped and draped in the usual sterile fashion. The  correct Gina Williams, procedure, and site was verified.   Injection Procedure Details:  Procedure Site One Meds Administered:  Meds ordered this encounter  Medications  . methylPREDNISolone acetate (DEPO-MEDROL) injection 80 mg     Laterality: Left  Location/Site:  C3-4  Needle size: 25 G  Needle type: Spinal  Needle Placement: Articular  Findings:  -Contrast Used: 0.5 mL iohexol 180 mg iodine/mL   -Comments: Excellent flow of contrast producing a partial arthrogram.  Procedure Details: The region overlying the facet joints mentioned above were localized under fluoroscopic visualization. The needle was inserted down to the level of the lateral mass of the superior articular process of the facet joint to be injected. Then, the needle was "walked off" inferiorly into the lateral aspect of the facet joint. Bi-planar images were used for confirming placement and spot radiographs were documented.  A 0.25 ml volume of Omnipaque-240 was injected into the facet joint and a standard partial arthrogram was obtained. Radiographs were obtained of the arthrogram. A 0.5 ml. volume of the steroid/anesthetic solution was injected into the joint. This procedure was repeated for each facet joint injected.   Additional Comments:  The Gina Williams tolerated the procedure well Dressing: Band-Aid    Post-procedure details: Gina Williams was observed during the procedure. Post-procedure instructions were reviewed.  Gina Williams left the clinic in stable condition.   Clinical History: MRI CERVICAL SPINE WITHOUT CONTRAST  TECHNIQUE: Multiplanar, multisequence MR imaging of the cervical spine  was performed. No intravenous contrast was administered.  COMPARISON:  Radiography 07/02/2017  FINDINGS: Alignment: Normal  Vertebrae: No fracture or primary bone lesion. Edematous facet arthropathy on the left at C3-4 as described below.  Cord: No cord compression or primary cord lesion.  Posterior Fossa,  vertebral arteries, paraspinal tissues: Negative  Disc levels:  No abnormality at foramen magnum, C1-2 or C2-3.  C3-4: Endplate osteophytes and bulging of the disc slightly more prominent towards the left. Left-sided facet arthropathy with pronounced edema. No central canal compression. Foraminal narrowing on the left could affect the C4 nerve. The facet arthropathy is quite likely to be a cause of neck pain or referred facet pain.  C4-5: Spondylosis with endplate osteophytes and bulging of the disc. Mild foraminal narrowing on the left, not likely compressive.  C5-6: Normal interspace.  C6-7: Right-sided predominant spondylosis with endplate osteophytes and bulging of the disc. No compressive narrowing of the canal or foramina.  C7-T1: Normal interspace.  IMPRESSION: The most pronounced finding in this case is at C3-4 where there is edematous facet arthropathy on the left. In combination with some spondylosis, this results in foraminal narrowing on the left that could affect the left C4 nerve. Additionally, the facet itself is quite likely to be symptomatic.   Electronically Signed   By: Nelson Chimes M.D.   On: 07/15/2017 12:46   Gina Williams reports that Gina Williams quit smoking about 19 years ago. Gina Williams has never used smokeless tobacco. No results for input(s): HGBA1C, LABURIC in the last 8760 hours.  Objective:  VS:  HT:    WT:   BMI:     BP:134/63  HR:90bpm  TEMP: ( )  RESP:  Physical Exam  Ortho Exam Imaging: No results found.  Past Medical/Family/Surgical/Social History: Medications & Allergies reviewed per EMR, new medications updated. Gina Williams Active Problem List   Diagnosis Date Noted  . Cystocele with prolapse 09/22/2017  . Left shoulder pain 06/25/2017  . Acute sinus infection 06/25/2017  . S/P hysterectomy 10/23/2016  . Pre-operative examination 09/23/2016  . Atherosclerosis of aorta ([place]) 09/23/2016  . Need for hepatitis C screening test 12/23/2015  .  Routine general medical examination at a health care facility 12/01/2014  . Estrogen deficiency 12/01/2014  . Osteopenia 11/28/2013  . Colon cancer screening 11/03/2012  . Hyperlipidemia 11/03/2012  . Encounter for Medicare annual wellness exam 10/26/2012  . GERD 01/23/2010  . History of breast cancer 01/23/2010   Past Medical History:  Diagnosis Date  . Arthritis    hands  . Breast cancer ([place]) 09/16/2007   Mastectomy  . Dyspnea    last 5 months, lack of conditioning  . GERD (gastroesophageal reflux disease)   . Hypoglycemic reaction    fainted in her youth , hadnt eaten anything for hours and was assisting with a vet surgery , hasnt reoccurred since  , but is aware Gina Williams needs to eat   . Osteopenia    In the past, now nl. BMD  . PONV (postoperative nausea and vomiting)    SEVERE, Gina Williams is very concerned about this issue occurred with mastectomy sugery ; no issues whn Gina Williams had hysterectomy   . Vertigo 2007   Family History  Problem Relation Age of Onset  . Asthma Mother   . Aortic aneurysm Mother   . Stroke Father   . Alzheimer's disease Father   . Cancer Paternal Aunt        Breast Cancer  . Breast cancer Paternal Aunt   . Cancer Paternal  Aunt        Breast Cancer  . Breast cancer Paternal Aunt   . Cancer Paternal Aunt        Breast Cancer  . Breast cancer Paternal Aunt   . Stroke Maternal Uncle   . Hypertension Other        HTN on Father's side of family   Past Surgical History:  Procedure Laterality Date  . ANTERIOR AND POSTERIOR REPAIR N/A 09/22/2017   Procedure: CYSTOSCOPY ANTERIOR (CYSTOCELE);  Surgeon: Bjorn Loser, MD;  Location: [facility];  Service: Urology;  Laterality: N/A;  . CATARACT EXTRACTION, BILATERAL    . COLONOSCOPY    . CYSTOSCOPY N/A 10/23/2016   Procedure: CYSTOSCOPY;  Surgeon: Aloha Gell, MD;  Location: [facility];  Service: Gynecology;  Laterality: N/A;  . GANGLION CYST EXCISION Left 1970s   Multiple surgeries  . HAND SURGERY Left 10/2005     Nerve damage  . HAND SURGERY Left 2011   Torn ligament surgery  . MASTECTOMY  8/09   Breast Cancer  . Nuclear Stress Test     Negative  EF 77%  . pap  08/05/2014   [facility]  . pessary  12/19/2015   [facility] Dr. Pamala Hurry  . ROBOTIC ASSISTED TOTAL HYSTERECTOMY WITH BILATERAL SALPINGO OOPHERECTOMY Bilateral 10/23/2016   Procedure: ROBOTIC ASSISTED TOTAL HYSTERECTOMY WITH BILATERAL SALPINGO OOPHORECTOMY/Uterosacral Ligament Suspension;  Surgeon: Aloha Gell, MD;  Location: [facility];  Service: Gynecology;  Laterality: Bilateral;  . SHOULDER SURGERY Bilateral Late 1980s   Frozen shoulders bilaterally, no surgery, had physical therapy  . Stress myoview  12/05   Low risk  . TONSILLECTOMY  Late 1970s  . TUBAL LIGATION    . VAGINAL PROLAPSE REPAIR N/A 09/22/2017   Procedure: VAGINAL VAULT SUSPENSION AND GRAFT;  Surgeon: Bjorn Loser, MD;  Location: [facility];  Service: Urology;  Laterality: N/A;   Social History   Occupational History  . Not on file  Tobacco Use  . Smoking status: Former Smoker    Last attempt to quit: 05/21/1998    Years since quitting: 19.3  . Smokeless tobacco: Never Used  Substance and Sexual Activity  . Alcohol use: No    Alcohol/week: 0.0 standard drinks  . Drug use: No  . Sexual activity: Never    Birth control/protection: Post-menopausal

## 2017-09-28 NOTE — Procedures (Signed)
Cervical Facet Joint Intra-Articular Injection with Fluoroscopic Guidance  Patient: Gina Williams      Date of Birth: 07-09-1945 MRN: [id number] PCP: Abner Greenspan, MD      Visit Date: 09/14/2017   Universal Protocol:    Date/Time: 09/09/195:21 AM  Consent Given By: the patient  Position: PRONE  Additional Comments: Vital signs were monitored before and after the procedure. Patient was prepped and draped in the usual sterile fashion. The correct patient, procedure, and site was verified.   Injection Procedure Details:  Procedure Site One Meds Administered:  Meds ordered this encounter  Medications  . methylPREDNISolone acetate (DEPO-MEDROL) injection 80 mg     Laterality: Left  Location/Site:  C3-4  Needle size: 25 G  Needle type: Spinal  Needle Placement: Articular  Findings:  -Contrast Used: 0.5 mL iohexol 180 mg iodine/mL   -Comments: Excellent flow of contrast producing a partial arthrogram.  Procedure Details: The region overlying the facet joints mentioned above were localized under fluoroscopic visualization. The needle was inserted down to the level of the lateral mass of the superior articular process of the facet joint to be injected. Then, the needle was "walked off" inferiorly into the lateral aspect of the facet joint. Bi-planar images were used for confirming placement and spot radiographs were documented.  A 0.25 ml volume of Omnipaque-240 was injected into the facet joint and a standard partial arthrogram was obtained. Radiographs were obtained of the arthrogram. A 0.5 ml. volume of the steroid/anesthetic solution was injected into the joint. This procedure was repeated for each facet joint injected.   Additional Comments:  The patient tolerated the procedure well Dressing: Band-Aid    Post-procedure details: Patient was observed during the procedure. Post-procedure instructions were reviewed.  Patient left the clinic in stable condition.

## 2017-10-02 DIAGNOSIS — N8111 Cystocele, midline: Secondary | ICD-10-CM | POA: Diagnosis not present

## 2017-10-02 DIAGNOSIS — R350 Frequency of micturition: Secondary | ICD-10-CM | POA: Diagnosis not present

## 2017-10-07 ENCOUNTER — Ambulatory Visit: Payer: Medicare Other

## 2017-11-04 ENCOUNTER — Ambulatory Visit
Admission: RE | Admit: 2017-11-04 | Discharge: 2017-11-04 | Disposition: A | Discharge status: Home / Self Care | Payer: Medicare Other | Source: Ambulatory Visit | Attending: Family Medicine | Admitting: Family Medicine

## 2017-11-04 DIAGNOSIS — Z1231 Encounter for screening mammogram for malignant neoplasm of breast: Secondary | ICD-10-CM | POA: Diagnosis not present

## 2017-11-04 HISTORY — DX: Personal history of antineoplastic chemotherapy: Z92.21

## 2017-12-09 DIAGNOSIS — R350 Frequency of micturition: Secondary | ICD-10-CM | POA: Diagnosis not present

## 2017-12-09 DIAGNOSIS — N8111 Cystocele, midline: Secondary | ICD-10-CM | POA: Diagnosis not present

## 2017-12-18 ENCOUNTER — Other Ambulatory Visit: Payer: Self-pay | Admitting: Family Medicine

## 2018-01-18 ENCOUNTER — Telehealth: Payer: Self-pay | Admitting: *Deleted

## 2018-01-18 MED ORDER — OSELTAMIVIR PHOSPHATE 75 MG PO CAPS: 75.0000 mg | ORAL_CAPSULE | Freq: Every day | ORAL | 0 refills | Status: DC

## 2018-01-18 NOTE — Telephone Encounter (Signed)
Spoke to pt who states her SIL Jenne Campus, was seen in clinic this am and was advised by Dr Glori Bickers, for her to have pt contact office. Blanch Media has triplet grandchildren and was recently Dx with the flu, along with one of the triplets. Pt is currently babysitting Joyce's grandchildren while she is sick, and is requesting a prophylactic be sent to [facility]. pls advise

## 2018-01-18 NOTE — Telephone Encounter (Signed)
I sent it  

## 2018-01-18 NOTE — Telephone Encounter (Signed)
Pt notified Rx sent 

## 2018-02-17 DIAGNOSIS — H04123 Dry eye syndrome of bilateral lacrimal glands: Secondary | ICD-10-CM | POA: Diagnosis not present

## 2018-02-17 DIAGNOSIS — H524 Presbyopia: Secondary | ICD-10-CM | POA: Diagnosis not present

## 2018-02-17 DIAGNOSIS — H02052 Trichiasis without entropian right lower eyelid: Secondary | ICD-10-CM | POA: Diagnosis not present

## 2018-02-17 DIAGNOSIS — H43813 Vitreous degeneration, bilateral: Secondary | ICD-10-CM | POA: Diagnosis not present

## 2018-03-24 ENCOUNTER — Telehealth: Payer: Self-pay

## 2018-03-24 NOTE — Telephone Encounter (Signed)
Pt left v/m that she was at [address] on 03/20/18 and was advised that nexium namebrand is not on formulary and would need prior auth. Pt request cb to ck on status of PA.

## 2018-03-29 NOTE — Telephone Encounter (Signed)
PA was approved, pharmacy notified

## 2018-03-29 NOTE — Telephone Encounter (Signed)
PA was done at [email or web address], I will await a response

## 2018-06-20 ENCOUNTER — Telehealth: Payer: Self-pay | Admitting: Family Medicine

## 2018-06-20 DIAGNOSIS — Z0000 Encounter for general adult medical examination without abnormal findings: Secondary | ICD-10-CM

## 2018-06-20 DIAGNOSIS — R7309 Other abnormal glucose: Secondary | ICD-10-CM

## 2018-06-20 DIAGNOSIS — E7800 Pure hypercholesterolemia, unspecified: Secondary | ICD-10-CM

## 2018-06-20 DIAGNOSIS — R7303 Prediabetes: Secondary | ICD-10-CM | POA: Insufficient documentation

## 2018-06-20 NOTE — Telephone Encounter (Signed)
-----   Message from Ellamae Sia sent at 06/17/2018  3:51 PM EDT ----- Regarding: Lab orders for Tuesday, 5.2.20 Lab orders

## 2018-06-22 ENCOUNTER — Ambulatory Visit (INDEPENDENT_AMBULATORY_CARE_PROVIDER_SITE_OTHER): Payer: Medicare Other

## 2018-06-22 ENCOUNTER — Other Ambulatory Visit (INDEPENDENT_AMBULATORY_CARE_PROVIDER_SITE_OTHER): Payer: Medicare Other

## 2018-06-22 VITALS — BP 106/68 | HR 90 | Temp 98.6°F | Resp 16 | Wt 133.6 lb

## 2018-06-22 DIAGNOSIS — E7800 Pure hypercholesterolemia, unspecified: Secondary | ICD-10-CM

## 2018-06-22 DIAGNOSIS — Z0000 Encounter for general adult medical examination without abnormal findings: Secondary | ICD-10-CM

## 2018-06-22 DIAGNOSIS — R7309 Other abnormal glucose: Secondary | ICD-10-CM | POA: Diagnosis not present

## 2018-06-22 LAB — LIPID PANEL
Cholesterol: 208 mg/dL — ABNORMAL HIGH (ref 0–200)
HDL: 57.1 mg/dL (ref >39.00)
LDL Cholesterol: 116 mg/dL — ABNORMAL HIGH (ref 0–99)
NonHDL: 150.63
Total CHOL/HDL Ratio: 4
Triglycerides: 175 mg/dL — ABNORMAL HIGH (ref 0.0–149.0)
VLDL: 35 mg/dL (ref 0.0–40.0)

## 2018-06-22 LAB — CBC WITH DIFFERENTIAL/PLATELET
Basophils Absolute: 0.1 10*3/uL (ref 0.0–0.1)
Basophils Relative: 1.1 % (ref 0.0–3.0)
Eosinophils Absolute: 0.1 10*3/uL (ref 0.0–0.7)
Eosinophils Relative: 1.2 % (ref 0.0–5.0)
HCT: 42 % (ref 36.0–46.0)
Hemoglobin: 14.5 g/dL (ref 12.0–15.0)
Lymphocytes Relative: 25.1 % (ref 12.0–46.0)
Lymphs Abs: 2.1 10*3/uL (ref 0.7–4.0)
MCHC: 34.5 g/dL (ref 30.0–36.0)
MCV: 87.4 fl (ref 78.0–100.0)
Monocytes Absolute: 0.6 10*3/uL (ref 0.1–1.0)
Monocytes Relative: 7.5 % (ref 3.0–12.0)
Neutro Abs: 5.5 10*3/uL (ref 1.4–7.7)
Neutrophils Relative %: 65.1 % (ref 43.0–77.0)
Platelets: 262 10*3/uL (ref 150.0–400.0)
RBC: 4.81 Mil/uL (ref 3.87–5.11)
RDW: 13.8 % (ref 11.5–15.5)
WBC: 8.5 10*3/uL (ref 4.0–10.5)

## 2018-06-22 LAB — COMPREHENSIVE METABOLIC PANEL
ALT: 13 U/L (ref 0–35)
AST: 14 U/L (ref 0–37)
Albumin: 4.1 g/dL (ref 3.5–5.2)
Alkaline Phosphatase: 66 U/L (ref 39–117)
BUN: 19 mg/dL (ref 6–23)
CO2: 27 mEq/L (ref 19–32)
Calcium: 9.3 mg/dL (ref 8.4–10.5)
Chloride: 105 mEq/L (ref 96–112)
Creatinine, Ser: 0.83 mg/dL (ref 0.40–1.20)
GFR: 67.27 mL/min (ref >60.00)
Glucose, Bld: 84 mg/dL (ref 70–99)
Potassium: 4.6 mEq/L (ref 3.5–5.1)
Sodium: 139 mEq/L (ref 135–145)
Total Bilirubin: 0.5 mg/dL (ref 0.2–1.2)
Total Protein: 6.7 g/dL (ref 6.0–8.3)

## 2018-06-22 LAB — HEMOGLOBIN A1C: Hgb A1c MFr Bld: 6 % (ref 4.6–6.5)

## 2018-06-22 LAB — TSH: TSH: 1.37 u[IU]/mL (ref 0.35–4.50)

## 2018-06-22 NOTE — Progress Notes (Signed)
Subjective:   Gina Williams is a 73 y.o. female who presents for Medicare Annual (Subsequent) preventive examination.  Review of Systems:  n/a Cardiac Risk Factors include: advanced age (>23men, >63 women);dyslipidemia     Objective:     Vitals: BP 106/68 Comment: patient supplied  Pulse 90 Comment: patient supplied  Temp 98.6 F (37 C) (Oral) Comment: patient supplied  Resp 16 Comment: patient supplied  Wt 133 lb 9.6 oz (60.6 kg) Comment: patient supplied  SpO2 93% Comment: patient supplied  BMI 23.67 kg/m   Body mass index is 23.67 kg/m.  Advanced Directives 06/22/2018 09/22/2017 09/22/2017 09/22/2017 09/14/2017 06/18/2017 10/23/2016  Does Patient Have a Medical Advance Directive? No Yes Yes Yes Yes Yes Yes  Type of Advance Directive - Living will - Living will Living will [place];Living will Living will  Does patient want to make changes to medical advance directive? - No - Patient declined - - No - Patient declined - No - Patient declined  Copy of [place] in Chart? - - - - - No - copy requested -  Would patient like information on creating a medical advance directive? No - Patient declined - - - - - -    Tobacco Social History   Tobacco Use  Smoking Status Former Smoker  . Last attempt to quit: 05/21/1998  . Years since quitting: 20.1  Smokeless Tobacco Never Used     Counseling given: No   Clinical Intake:  Pre-visit preparation completed: Yes  Pain : No/denies pain Pain Score: 0-No pain     Nutritional Status: BMI of 19-24  Normal Nutritional Risks: None  How often do you need to have someone help you when you read instructions, pamphlets, or other written materials from your doctor or pharmacy?: 1 - Never What is the last grade level you completed in school?: 12th grade  Interpreter Needed?: No  Comments: pt is a widow and lives alone Information entered by :: LPinson, LPN  Past Medical History:  Diagnosis  Date  . Arthritis    hands  . Breast cancer ([place]) 09/16/2007   Mastectomy  . Dyspnea    last 5 months, lack of conditioning  . GERD (gastroesophageal reflux disease)   . Hypoglycemic reaction    fainted in her youth , hadnt eaten anything for hours and was assisting with a vet surgery , hasnt reoccurred since  , but is aware she needs to eat   . Osteopenia    In the past, now nl. BMD  . Personal history of chemotherapy   . PONV (postoperative nausea and vomiting)    SEVERE, patient is very concerned about this issue occurred with mastectomy sugery ; no issues whn she had hysterectomy   . Vertigo 2007   Past Surgical History:  Procedure Laterality Date  . ANTERIOR AND POSTERIOR REPAIR N/A 09/22/2017   Procedure: CYSTOSCOPY ANTERIOR (CYSTOCELE);  Surgeon: Bjorn Loser, MD;  Location: [facility];  Service: Urology;  Laterality: N/A;  . CATARACT EXTRACTION, BILATERAL    . COLONOSCOPY    . CYSTOSCOPY N/A 10/23/2016   Procedure: CYSTOSCOPY;  Surgeon: Aloha Gell, MD;  Location: [facility];  Service: Gynecology;  Laterality: N/A;  . GANGLION CYST EXCISION Left 1970s   Multiple surgeries  . HAND SURGERY Left 10/2005   Nerve damage  . HAND SURGERY Left 2011   Torn ligament surgery  . MASTECTOMY  8/09   Breast Cancer  . Nuclear Stress Test  Negative  EF 77%  . pap  08/05/2014   [facility]  . pessary  12/19/2015   [facility] Dr. Pamala Hurry  . ROBOTIC ASSISTED TOTAL HYSTERECTOMY WITH BILATERAL SALPINGO OOPHERECTOMY Bilateral 10/23/2016   Procedure: ROBOTIC ASSISTED TOTAL HYSTERECTOMY WITH BILATERAL SALPINGO OOPHORECTOMY/Uterosacral Ligament Suspension;  Surgeon: Aloha Gell, MD;  Location: [facility];  Service: Gynecology;  Laterality: Bilateral;  . SHOULDER SURGERY Bilateral Late 1980s   Frozen shoulders bilaterally, no surgery, had physical therapy  . Stress myoview  12/05   Low risk  . TONSILLECTOMY  Late 1970s  . TUBAL LIGATION    . VAGINAL PROLAPSE REPAIR N/A 09/22/2017    Procedure: VAGINAL VAULT SUSPENSION AND GRAFT;  Surgeon: Bjorn Loser, MD;  [facility];  Service: Urology;  Laterality: N/A;   Family History  Problem Relation Age of Onset  . Asthma Mother   . Aortic aneurysm Mother   . Stroke Father   . Alzheimer's disease Father   . Cancer Paternal Aunt        Breast Cancer  . Breast cancer Paternal Aunt   . Cancer Paternal Aunt        Breast Cancer  . Breast cancer Paternal Aunt   . Cancer Paternal Aunt        Breast Cancer  . Breast cancer Paternal Aunt   . Stroke Maternal Uncle   . Hypertension Other        HTN on Father's side of family   Social History   Socioeconomic History  . Marital status: Single    Spouse name: Not on file  . Number of children: Not on file  . Years of education: Not on file  . Highest education level: Not on file  Occupational History  . Not on file  Social Needs  . Financial resource strain: Not on file  . Food insecurity:    Worry: Not on file    Inability: Not on file  . Transportation needs:    Medical: Not on file    Non-medical: Not on file  Tobacco Use  . Smoking status: Former Smoker    Last attempt to quit: 05/21/1998    Years since quitting: 20.1  . Smokeless tobacco: Never Used  Substance and Sexual Activity  . Alcohol use: No    Alcohol/week: 0.0 standard drinks  . Drug use: No  . Sexual activity: Never    Birth control/protection: Post-menopausal  Lifestyle  . Physical activity:    Days per week: Not on file    Minutes per session: Not on file  . Stress: Not on file  Relationships  . Social connections:    Talks on phone: Not on file    Gets together: Not on file    Attends religious service: Not on file    Active member of club or organization: Not on file    Attends meetings of clubs or organizations: Not on file    Relationship status: Not on file  Other Topics Concern  . Not on file  Social History Narrative  . Not on file    Outpatient Encounter  Medications as of 06/22/2018  Medication Sig  . acetaminophen (TYLENOL) 500 MG tablet Take 500 mg by mouth every 6 (six) hours as needed for moderate pain or headache.   . Cholecalciferol (VITAMIN D3 PO) Take 5,000 Units by mouth daily.  [name] Kitchen NEXIUM 40 MG capsule TAKE 1 CAPSULE BY MOUTH ONCE DAILY BEFORE BREAKFAST  . Omega-3 Fatty Acids (FISH OIL PO) Take  1,900 mg by mouth daily.  . [DISCONTINUED] fluticasone (FLONASE) 50 MCG/ACT nasal spray Place 2 sprays into both nostrils daily as needed for allergies or rhinitis. (Patient not taking: Reported on 09/14/2017)  . [DISCONTINUED] HYDROcodone-acetaminophen (NORCO/VICODIN) 5-325 MG tablet Take 1-2 tablets by mouth every 4 (four) hours as needed for moderate pain.  . [DISCONTINUED] ibuprofen (ADVIL,MOTRIN) 200 MG tablet Take 200 mg by mouth every 6 (six) hours as needed for headache or moderate pain.  . [DISCONTINUED] methocarbamol (ROBAXIN) 500 MG tablet Take 1 tablet (500 mg total) by mouth every 8 (eight) hours as needed for muscle spasms. (Patient not taking: Reported on 09/07/2017)  . [DISCONTINUED] oseltamivir (TAMIFLU) 75 MG capsule Take 1 capsule (75 mg total) by mouth daily.  . [DISCONTINUED] OVER THE COUNTER MEDICATION Apply 1 application topically 3 (three) times a week. CBS Cream   No facility-administered encounter medications on file as of 06/22/2018.     Activities of Daily Living In your present state of health, do you have any difficulty performing the following activities: 06/22/2018 09/22/2017  Hearing? N N  Vision? N N  Difficulty concentrating or making decisions? N N  Walking or climbing stairs? Y N  Dressing or bathing? N N  Doing errands, shopping? N N  Preparing Food and eating ? N -  Using the Toilet? N -  In the past six months, have you accidently leaked urine? Y -  Do you have problems with loss of bowel control? N -  Managing your Medications? N -  Managing your Finances? N -  Housekeeping or managing your Housekeeping? N -   Some recent data might be hidden    Patient Care Team: Tower, Wynelle Fanny, MD as PCP - General Marygrace Drought, MD as Consulting Physician (Ophthalmology) Aloha Gell, MD as Consulting Physician (Obstetrics and Gynecology) Eula Listen, DDS as Referring Physician (Dentistry)    Assessment:   This is a routine wellness examination for [place].  Vision Screening Comments: Vision exam in 2019; future appt scheduled in August 2020  Exercise Activities and Dietary recommendations Current Exercise Habits: The patient does not participate in regular exercise at present, Exercise limited by: None identified  Goals    . DIET - INCREASE WATER INTAKE     Starting 06/19/2018, I will continue to drink at least 50-60 ounces of water daily.        Fall Risk Fall Risk  06/22/2018 06/18/2017 12/26/2015 12/01/2014 11/28/2013  Falls in the past year? 0 No No No No   Depression Screen PHQ 2/9 Scores 06/22/2018 06/18/2017 12/26/2015 12/01/2014  PHQ - 2 Score 0 0 0 0  PHQ- 9 Score 0 0 - -     Cognitive Function MMSE - Mini Mental State Exam 06/22/2018 06/18/2017 12/26/2015  Orientation to time 5 5 5   Orientation to Place 5 5 5   Registration 3 3 3   Attention/ Calculation 0 0 0  Recall 3 3 3   Language- name 2 objects 0 0 0  Language- repeat 1 1 1   Language- follow 3 step command 0 3 3  Language- read & follow direction 0 0 0  Write a sentence 0 0 0  Copy design 0 0 0  Total score 17 20 20      PLEASE NOTE: A Mini-Cog screen was completed. Maximum score is 17. A value of 0 denotes this part of [place] MMSE was not completed or the patient failed this part of the Mini-Cog screening.   Mini-Cog Screening Orientation to Time - Max  5 pts Orientation to Place - Max 5 pts Registration - Max 3 pts Recall - Max 3 pts Language Repeat - Max 1 pts      Immunization History  Administered Date(s) Administered  . Influenza,inj,Quad PF,6+ Mos 11/18/2012  . Td 06/29/2012    Screening Tests Health  Maintenance  Topic Date Due  . INFLUENZA VACCINE  02/22/2020 (Originally 08/21/2018)  . PNA vac Low Risk Adult (1 of 2 - PCV13) 12/25/2025 (Originally 11/13/2009)  . MAMMOGRAM  11/05/2018  . TETANUS/TDAP  06/30/2022  . COLONOSCOPY  02/01/2023  . DEXA SCAN  Completed  . Hepatitis C Screening  Completed      Plan:    I have personally reviewed, addressed, and noted the following in the patient's chart:  A. Medical and social history B. Use of alcohol, tobacco or illicit drugs  C. Current medications and supplements D. Functional ability and status E.  Nutritional status F.  Physical activity G. Advance directives H. List of other physicians I.  Hospitalizations, surgeries, and ER visits in previous 12 months J.  Vitals (unless it is a telemedicine encounter) K. Screenings to include cognitive, depression, hearing, vision (NOTE: hearing and vision screenings not completed in telemedicine encounter) L. Referrals and appointments   In addition, I have reviewed and discussed with patient certain preventive protocols, quality metrics, and best practice recommendations. A written personalized care plan for preventive services and recommendations were provided to patient.  With patient's permission, we connected on 06/22/18 at 10:00 AM EDT. Interactive audio and video telecommunications were attempted with patient. This attempt was unsuccessful due to patient having technical difficulties OR patient did not have access to video capability.  Encounter was completed with audio only.  Two patient identifiers were used to ensure the encounter occurred with the correct person. Patient was in home and writer was in office.   Signed,   Lindell Noe, MHA, BS, LPN Health Coach

## 2018-06-22 NOTE — Patient Instructions (Signed)
Gina Williams , Thank you for taking time to come for your Medicare Wellness Visit. I appreciate your ongoing commitment to your health goals. Please review the following plan we discussed and let me know if I can assist you in the future.   These are the goals we discussed: Goals    . DIET - INCREASE WATER INTAKE     Starting 06/19/2018, I will continue to drink at least 50-60 ounces of water daily.        This is a list of the screening recommended for you and due dates:  Health Maintenance  Topic Date Due  . Flu Shot  02/22/2020*  . Pneumonia vaccines (1 of 2 - PCV13) 12/25/2025*  . Mammogram  11/05/2018  . Tetanus Vaccine  06/30/2022  . Colon Cancer Screening  02/01/2023  . DEXA scan (bone density measurement)  Completed  .  Hepatitis C: One time screening is recommended by Center for Disease Control  (CDC) for  adults born from [birth year] through 1966.   Completed  *Topic was postponed. The date shown is not the original due date.   Preventive Care for Adults  A healthy lifestyle and preventive care can promote health and wellness. Preventive health guidelines for adults include the following key practices.  . A routine yearly physical is a good way to check with your health care provider about your health and preventive screening. It is a chance to share any concerns and updates on your health and to receive a thorough exam.  . Visit your dentist for a routine exam and preventive care every 6 months. Brush your teeth twice a day and floss once a day. Good oral hygiene prevents tooth decay and gum disease.  . The frequency of eye exams is based on your age, health, family medical history, use  of contact lenses, and other factors. Follow your health care provider's recommendations for frequency of eye exams.  . Eat a healthy diet. Foods like vegetables, fruits, whole grains, low-fat dairy products, and lean protein foods contain the nutrients you need without too many calories.  Decrease your intake of foods high in solid fats, added sugars, and salt. Eat the right amount of calories for you. Get information about a proper diet from your health care provider, if necessary.  . Regular physical exercise is one of the most important things you can do for your health. Most adults should get at least 150 minutes of moderate-intensity exercise (any activity that increases your heart rate and causes you to sweat) each week. In addition, most adults need muscle-strengthening exercises on 2 or more days a week.  Silver Sneakers may be a benefit available to you. To determine eligibility, you may visit the website: www.silversneakers.com or contact program at 7148171609 Mon-Fri between 8AM-8PM.   . Maintain a healthy weight. The body mass index (BMI) is a screening tool to identify possible weight problems. It provides an estimate of body fat based on height and weight. Your health care provider can find your BMI and can help you achieve or maintain a healthy weight.   For adults 20 years and older: ? A BMI below 18.5 is considered underweight. ? A BMI of 18.5 to 24.9 is normal. ? A BMI of 25 to 29.9 is considered overweight. ? A BMI of 30 and above is considered obese.   . Maintain normal blood lipids and cholesterol levels by exercising and minimizing your intake of saturated fat. Eat a balanced diet with plenty of fruit  and vegetables. Blood tests for lipids and cholesterol should begin at age 43 and be repeated every 5 years. If your lipid or cholesterol levels are high, you are over 50, or you are at high risk for heart disease, you may need your cholesterol levels checked more frequently. Ongoing high lipid and cholesterol levels should be treated with medicines if diet and exercise are not working.  . If you smoke, find out from your health care provider how to quit. If you do not use tobacco, please do not start.  . If you choose to drink alcohol, please do not consume  more than 2 drinks per day. One drink is considered to be 12 ounces (355 mL) of beer, 5 ounces (148 mL) of wine, or 1.5 ounces (44 mL) of liquor.  . If you are 62-73 years old, ask your health care provider if you should take aspirin to prevent strokes.  . Use sunscreen. Apply sunscreen liberally and repeatedly throughout the day. You should seek shade when your shadow is shorter than you. Protect yourself by wearing long sleeves, pants, a wide-brimmed hat, and sunglasses year round, whenever you are outdoors.  . Once a month, do a whole body skin exam, using a mirror to look at the skin on your back. Tell your health care provider of new moles, moles that have irregular borders, moles that are larger than a pencil eraser, or moles that have changed in shape or color.

## 2018-06-22 NOTE — Progress Notes (Signed)
PCP notes:   Health maintenance:  No gaps identified.   Abnormal screenings:   None  Patient concerns:   None  Nurse concerns:  None  Next PCP appt:   06/29/18 @ 0930  I reviewed health advisor's note, was available for consultation, and agree with documentation and plan. Loura Pardon MD

## 2018-06-23 ENCOUNTER — Telehealth: Payer: Self-pay | Admitting: Family Medicine

## 2018-06-23 NOTE — Telephone Encounter (Signed)
I changed patient's 06/29/18 annual wellness visit to a phone call.  Patient wanted to know if a copy of her lab results could be faxed to her home phone number before the visit, so she could review it with Dr.Tower.

## 2018-06-24 NOTE — Telephone Encounter (Signed)
I advised pt I will fax labs to her home # [phone] about 10 min before her appt on 06/29/18

## 2018-06-29 ENCOUNTER — Ambulatory Visit (INDEPENDENT_AMBULATORY_CARE_PROVIDER_SITE_OTHER): Payer: Medicare Other | Admitting: Family Medicine

## 2018-06-29 ENCOUNTER — Encounter: Payer: Self-pay | Admitting: Family Medicine

## 2018-06-29 VITALS — BP 107/68 | HR 89 | Temp 97.8°F | Ht 64.0 in | Wt 135.0 lb

## 2018-06-29 DIAGNOSIS — I700 Atherosclerosis of aorta: Secondary | ICD-10-CM

## 2018-06-29 DIAGNOSIS — Z853 Personal history of malignant neoplasm of breast: Secondary | ICD-10-CM

## 2018-06-29 DIAGNOSIS — K219 Gastro-esophageal reflux disease without esophagitis: Secondary | ICD-10-CM | POA: Diagnosis not present

## 2018-06-29 DIAGNOSIS — E7800 Pure hypercholesterolemia, unspecified: Secondary | ICD-10-CM | POA: Diagnosis not present

## 2018-06-29 DIAGNOSIS — R7309 Other abnormal glucose: Secondary | ICD-10-CM

## 2018-06-29 DIAGNOSIS — M8589 Other specified disorders of bone density and structure, multiple sites: Secondary | ICD-10-CM | POA: Diagnosis not present

## 2018-06-29 DIAGNOSIS — Z2882 Immunization not carried out because of caregiver refusal: Secondary | ICD-10-CM | POA: Diagnosis not present

## 2018-06-29 MED ORDER — NEXIUM 40 MG PO CPDR: 40.0000 mg | DELAYED_RELEASE_CAPSULE | Freq: Every day | ORAL | 3 refills | Status: DC

## 2018-06-29 NOTE — Progress Notes (Signed)
Virtual Visit via Telephone Note  I connected with Gina Williams on 06/29/18 at  9:30 AM EDT by telephone and verified that I am speaking with the correct person using two identifiers.  Location: Patient: home Provider: office    I discussed the limitations, risks, security and privacy concerns of performing an evaluation and management service by telephone and the availability of in person appointments. I also discussed with the patient that there may be a patient responsible charge related to this service. The patient expressed understanding and agreed to proceed.  Visit is conducted by phone -pt does not have vis capability  History of Present Illness: Pt presents for annual f/u of chronic medical problems  Doing ok overall   Wt Readings from Last 3 Encounters:  06/29/18 135 lb (61.2 kg)  06/22/18 133 lb 9.6 oz (60.6 kg)  09/22/17 136 lb (61.7 kg)   23.17 kg/m  Not as active -taking care of her mother - dementia is worsening  Getting outdoors - mows 3 acres of grass Can get out when she has help with her mom  Garden   amw 6/2 No gaps or concerns  imms postponed-she declines   Mammogram 10/19 Personal h/o breast cancer with R mastectomy  She has been released by oncology Self breast exam-no changes   Colonoscopy 1/15 with 10 y recall   dexa 1/17  Declined further eval or tx last year  No falls or fx Supplements -taking her vit D  Ca constipates   Zoster status -declines vaccine   BP Readings from Last 3 Encounters:  06/29/18 107/68  06/22/18 106/68  09/23/17 (!) 114/52   Pulse Readings from Last 3 Encounters:  06/29/18 89  06/22/18 90  09/23/17 80   Lab Results  Component Value Date   CREATININE 0.83 06/22/2018   BUN 19 06/22/2018   NA 139 06/22/2018   K 4.6 06/22/2018   CL 105 06/22/2018   CO2 27 06/22/2018   Lab Results  Component Value Date   ALT 13 06/22/2018   AST 14 06/22/2018   ALKPHOS 66 06/22/2018   BILITOT 0.5 06/22/2018     Lab Results  Component Value Date   TSH 1.37 06/22/2018     H/o elevated glucose level Lab Results  Component Value Date   HGBA1C 6.0 06/22/2018  eating too many sweets  Also fruits and veg  Protein -needs to work on   Less exercise due to caring for dementia pt    Hyperlipidemia Lab Results  Component Value Date   CHOL 208 (H) 06/22/2018   CHOL 174 06/18/2017   CHOL 190 12/26/2015   Lab Results  Component Value Date   HDL 57.10 06/22/2018   HDL 52.60 06/18/2017   HDL 51.00 12/26/2015   Lab Results  Component Value Date   LDLCALC 116 (H) 06/22/2018   LDLCALC 100 (H) 06/18/2017   LDLCALC 118 (H) 12/26/2015   Lab Results  Component Value Date   TRIG 175.0 (H) 06/22/2018   TRIG 106.0 06/18/2017   TRIG 102.0 12/26/2015   Lab Results  Component Value Date   CHOLHDL 4 06/22/2018   CHOLHDL 3 06/18/2017   CHOLHDL 4 12/26/2015   Lab Results  Component Value Date   LDLDIRECT 156.2 10/27/2012  eating worse/differently  Lots of fatty sweets   Takes nexium for gerd Works well Cannot miss a dose (tried to wean and symptoms were severe even watching her diet)   Review of Systems  Constitutional: Negative for chills,  fever, malaise/fatigue and weight loss.  HENT: Negative for hearing loss and sore throat.   Eyes: Negative for blurred vision.  Respiratory: Negative for cough and shortness of breath.   Cardiovascular: Negative for chest pain, palpitations and leg swelling.  Gastrointestinal: Negative for heartburn and nausea.  Musculoskeletal: Negative for myalgias.  Skin: Negative for rash.  Neurological: Negative for dizziness and headaches.  Endo/Heme/Allergies: Negative for polydipsia.  Psychiatric/Behavioral: Negative for depression. The patient does not have insomnia.        Caregiver stress-mother with dementia -full time No time for self care    Patient Active Problem List   Diagnosis Date Noted  . Parent refuses immunizations 06/29/2018  . Elevated  glucose level 06/20/2018  . Cystocele with prolapse 09/22/2017  . Left shoulder pain 06/25/2017  . S/P hysterectomy 10/23/2016  . Pre-operative examination 09/23/2016  . Atherosclerosis of aorta ([place]) 09/23/2016  . Need for hepatitis C screening test 12/23/2015  . Routine general medical examination at a health care facility 12/01/2014  . Estrogen deficiency 12/01/2014  . Osteopenia 11/28/2013  . Colon cancer screening 11/03/2012  . Hyperlipidemia 11/03/2012  . Encounter for Medicare annual wellness exam 10/26/2012  . GERD 01/23/2010  . History of breast cancer 01/23/2010   Past Medical History:  Diagnosis Date  . Arthritis    hands  . Breast cancer ([place]) 09/16/2007   Mastectomy  . Dyspnea    last 5 months, lack of conditioning  . GERD (gastroesophageal reflux disease)   . Hypoglycemic reaction    fainted in her youth , hadnt eaten anything for hours and was assisting with a vet surgery , hasnt reoccurred since  , but is aware she needs to eat   . Osteopenia    In the past, now nl. BMD  . Personal history of chemotherapy   . PONV (postoperative nausea and vomiting)    SEVERE, patient is very concerned about this issue occurred with mastectomy sugery ; no issues whn she had hysterectomy   . Vertigo 2007   Past Surgical History:  Procedure Laterality Date  . ANTERIOR AND POSTERIOR REPAIR N/A 09/22/2017   Procedure: CYSTOSCOPY ANTERIOR (CYSTOCELE);  Surgeon: Bjorn Loser, MD;  Location: [facility];  Service: Urology;  Laterality: N/A;  . CATARACT EXTRACTION, BILATERAL    . COLONOSCOPY    . CYSTOSCOPY N/A 10/23/2016   Procedure: CYSTOSCOPY;  Surgeon: Aloha Gell, MD;  Location: [facility];  Service: Gynecology;  Laterality: N/A;  . GANGLION CYST EXCISION Left 1970s   Multiple surgeries  . HAND SURGERY Left 10/2005   Nerve damage  . HAND SURGERY Left 2011   Torn ligament surgery  . MASTECTOMY  8/09   Breast Cancer  . Nuclear Stress Test     Negative  EF 77%  . pap   08/05/2014   [facility]  . pessary  12/19/2015   [facility] Dr. Pamala Hurry  . ROBOTIC ASSISTED TOTAL HYSTERECTOMY WITH BILATERAL SALPINGO OOPHERECTOMY Bilateral 10/23/2016   Procedure: ROBOTIC ASSISTED TOTAL HYSTERECTOMY WITH BILATERAL SALPINGO OOPHORECTOMY/Uterosacral Ligament Suspension;  Surgeon: Aloha Gell, MD;  Location: [facility];  Service: Gynecology;  Laterality: Bilateral;  . SHOULDER SURGERY Bilateral Late 1980s   Frozen shoulders bilaterally, no surgery, had physical therapy  . Stress myoview  12/05   Low risk  . TONSILLECTOMY  Late 1970s  . TUBAL LIGATION    . VAGINAL PROLAPSE REPAIR N/A 09/22/2017   Procedure: VAGINAL VAULT SUSPENSION AND GRAFT;  Surgeon: Bjorn Loser, MD;  Location: [facility];  Service: Urology;  Laterality: N/A;   Social History   Tobacco Use  . Smoking status: Former Smoker    Last attempt to quit: 05/21/1998    Years since quitting: 20.1  . Smokeless tobacco: Never Used  Substance Use Topics  . Alcohol use: No    Alcohol/week: 0.0 standard drinks  . Drug use: No   Family History  Problem Relation Age of Onset  . Asthma Mother   . Aortic aneurysm Mother   . Stroke Father   . Alzheimer's disease Father   . Cancer Paternal Aunt        Breast Cancer  . Breast cancer Paternal Aunt   . Cancer Paternal Aunt        Breast Cancer  . Breast cancer Paternal Aunt   . Cancer Paternal Aunt        Breast Cancer  . Breast cancer Paternal Aunt   . Stroke Maternal Uncle   . Hypertension Other        HTN on Father's side of family   Allergies  Allergen Reactions  . Amoxicillin-Pot Clavulanate Nausea Only and Other (See Comments)    REACTION: nausea (tolerates amoxil) Has patient had a PCN reaction causing immediate rash, facial/tongue/throat swelling, SOB or lightheadedness with hypotension: No Has patient had a PCN reaction causing severe rash involving mucus membranes or skin necrosis: Unknown Has patient had a PCN reaction that required  hospitalization: No Has patient had a PCN reaction occurring within the last 10 years: No If all of the above answers are "NO", then may proceed with Cephalosporin use.   [name] Kitchen Omeprazole Other (See Comments)    ineffective   . Other Nausea And Vomiting and Other (See Comments)    Anesthesia-vomiting/dry heaves (up to ~12hrs after)   Current Outpatient Medications on File Prior to Visit  Medication Sig Dispense Refill  . acetaminophen (TYLENOL) 500 MG tablet Take 500 mg by mouth every 6 (six) hours as needed for moderate pain or headache.     . Cholecalciferol (VITAMIN D3 PO) Take 5,000 Units by mouth daily.    . Omega-3 Fatty Acids (FISH OIL PO) Take 1,900 mg by mouth daily.     No current facility-administered medications on file prior to visit.     Observations/Objective: Patient sounds well/ like her usual self  Normal voice/not hoarse Not sob or coughing during interview  Bright affect -sounds like mood is good  No cognitive deficits- mentally sharp  Talkative and pleasant  Candidly talks about caregiver stressors /does not feel overwhelmed currently  Assessment and Plan: Problem List Items Addressed This Visit      Cardiovascular and Mediastinum   Atherosclerosis of aorta (H[facility])    No symptoms Very good BP Cholesterol is fair-working on diet          Digestive   GERD    Continues nexium Unable to wean off even with diet control  Enc good habits       Relevant Medications   NEXIUM 40 MG capsule     Musculoskeletal and Integument   Osteopenia    dexa 2017 No falls or fractures  On vit D (intol of ca)  Encourage exercise as tolerated/able inst to let us know if she changes her mind about re check or tx        Other   History of breast cancer    No re occurrence No longer seeing oncol Mammogram due in oct      Hyperlipidemia - Primary  Disc goals for lipids and reasons to control them Rev last labs with pt Rev low sat fat diet in detail LDL is up  to 116 from 100 Disc goal of 100 or below She plans to work on diet which has been sub optimal      Elevated glucose level    Lab Results  Component Value Date   HGBA1C 6.0 06/22/2018   Prediabetes disc imp of low glycemic diet and wt loss to prevent DM2 She has been eating sweets and plans to stop      Parent refuses immunizations    Including flu/pna and zoster          Follow Up Instructions: The office will call you to set up next years annual visits  Also we will mail labs since fax was hard to read   For cholesterol-goal is LDL under 100 This is up  Avoid red meat/ fried foods/ egg yolks/ fatty breakfast meats/ butter, cheese and high fat dairy/ and shellfish   You are prediabetic  To prevent diabetes Try to get most of your carbohydrates from produce (with the exception of white potatoes)  Eat less bread/pasta/rice/snack foods/cereals/sweets and other items from the middle of the grocery store (processed carbs)   Exercise when you are able    I discussed the assessment and treatment plan with the patient. The patient was provided an opportunity to ask questions and all were answered. The patient agreed with the plan and demonstrated an understanding of the instructions.   The patient was advised to call back or seek an in-person evaluation if the symptoms worsen or if the condition fails to improve as anticipated.  I provided 21 minutes of non-face-to-face time during this encounter.   Loura Pardon, MD

## 2018-06-29 NOTE — Assessment & Plan Note (Signed)
No re occurrence No longer seeing oncol Mammogram due in oct

## 2018-06-29 NOTE — Assessment & Plan Note (Signed)
dexa 2017 No falls or fractures  On vit D (intol of ca)  Encourage exercise as tolerated/able inst to let us know if she changes her mind about re check or tx

## 2018-06-29 NOTE — Assessment & Plan Note (Signed)
Continues nexium Unable to wean off even with diet control  Enc good habits

## 2018-06-29 NOTE — Assessment & Plan Note (Signed)
Including flu/pna and zoster

## 2018-06-29 NOTE — Patient Instructions (Signed)
The office will call you to set up next years annual visits  Also we will mail labs since fax was hard to read   For cholesterol-goal is LDL under 100 This is up  Avoid red meat/ fried foods/ egg yolks/ fatty breakfast meats/ butter, cheese and high fat dairy/ and shellfish   You are prediabetic  To prevent diabetes Try to get most of your carbohydrates from produce (with the exception of white potatoes)  Eat less bread/pasta/rice/snack foods/cereals/sweets and other items from the middle of the grocery store (processed carbs)   Exercise when you are able

## 2018-06-29 NOTE — Assessment & Plan Note (Signed)
No symptoms Very good BP Cholesterol is fair-working on diet

## 2018-06-29 NOTE — Assessment & Plan Note (Signed)
Disc goals for lipids and reasons to control them Rev last labs with pt Rev low sat fat diet in detail LDL is up to 116 from 100 Disc goal of 100 or below She plans to work on diet which has been sub optimal

## 2018-06-29 NOTE — Assessment & Plan Note (Signed)
Lab Results  Component Value Date   HGBA1C 6.0 06/22/2018   Prediabetes disc imp of low glycemic diet and wt loss to prevent DM2 She has been eating sweets and plans to stop

## 2018-08-11 ENCOUNTER — Encounter: Payer: Self-pay | Admitting: Family Medicine

## 2018-08-11 ENCOUNTER — Ambulatory Visit (INDEPENDENT_AMBULATORY_CARE_PROVIDER_SITE_OTHER): Payer: Medicare Other | Admitting: Family Medicine

## 2018-08-11 ENCOUNTER — Other Ambulatory Visit: Payer: Self-pay

## 2018-08-11 DIAGNOSIS — H9202 Otalgia, left ear: Secondary | ICD-10-CM | POA: Diagnosis not present

## 2018-08-11 MED ORDER — AMOXICILLIN 500 MG PO CAPS: 500.0000 mg | ORAL_CAPSULE | Freq: Three times a day (TID) | ORAL | 0 refills | Status: DC

## 2018-08-11 NOTE — Progress Notes (Signed)
Virtual Visit via Video Note  I connected with Gina Williams on 08/11/18 at  9:00 AM EDT by a video enabled telemedicine application and verified that I am speaking with the correct person using two identifiers.  Location: Patient: home Provider: office    I discussed the limitations of evaluation and management by telemedicine and the availability of in person appointments. The patient expressed understanding and agreed to proceed.  History of Present Illness: Pt presents with ST and L ear pain for 1 week  Throat is irritated on the L side  When she swallows it makes her ear hurt  Cheek hurts also  No redness or swelling   Pain in ear-dull pain all the time  Throbs a night  No outer ear pain  No drainage  Does get water in ear in the shower-not bad /usual   L side of nose is a little runny Tender in sinus under her eye  No purulent drainage No fever   Temp 97.6  bp 123/75  02 96%   No cough  No sob   A little tired but has not affected her day to day activities   July 9 had a spell of vertigo (dizziness and n/v)  It lasted 24 hours  Taking tylenol -it helps  Used some "ear ache drops"  Has used saline nasal spray   She has some fluticasone   No sick contacts  No covid contacts   Patient Active Problem List   Diagnosis Date Noted  . Acute otalgia, left 08/11/2018  . Parent refuses immunizations 06/29/2018  . Elevated glucose level 06/20/2018  . Cystocele with prolapse 09/22/2017  . Left shoulder pain 06/25/2017  . S/P hysterectomy 10/23/2016  . Pre-operative examination 09/23/2016  . Atherosclerosis of aorta ([place]) 09/23/2016  . Need for hepatitis C screening test 12/23/2015  . Routine general medical examination at a health care facility 12/01/2014  . Estrogen deficiency 12/01/2014  . Osteopenia 11/28/2013  . Colon cancer screening 11/03/2012  . Hyperlipidemia 11/03/2012  . Encounter for Medicare annual wellness exam 10/26/2012  . GERD  01/23/2010  . History of breast cancer 01/23/2010   Past Medical History:  Diagnosis Date  . Arthritis    hands  . Breast cancer ([place]) 09/16/2007   Mastectomy  . Dyspnea    last 5 months, lack of conditioning  . GERD (gastroesophageal reflux disease)   . Hypoglycemic reaction    fainted in her youth , hadnt eaten anything for hours and was assisting with a vet surgery , hasnt reoccurred since  , but is aware she needs to eat   . Osteopenia    In the past, now nl. BMD  . Personal history of chemotherapy   . PONV (postoperative nausea and vomiting)    SEVERE, patient is very concerned about this issue occurred with mastectomy sugery ; no issues whn she had hysterectomy   . Vertigo 2007   Past Surgical History:  Procedure Laterality Date  . ANTERIOR AND POSTERIOR REPAIR N/A 09/22/2017   Procedure: CYSTOSCOPY ANTERIOR (CYSTOCELE);  Surgeon: Bjorn Loser, MD;  Location: [facility];  Service: Urology;  Laterality: N/A;  . CATARACT EXTRACTION, BILATERAL    . COLONOSCOPY    . CYSTOSCOPY N/A 10/23/2016   Procedure: CYSTOSCOPY;  Surgeon: Aloha Gell, MD;  Location: [facility];  Service: Gynecology;  Laterality: N/A;  . GANGLION CYST EXCISION Left 1970s   Multiple surgeries  . HAND SURGERY Left 10/2005   Nerve damage  . HAND SURGERY  Left 2011   Torn ligament surgery  . MASTECTOMY  8/09   Breast Cancer  . Nuclear Stress Test     Negative  EF 77%  . pap  08/05/2014   [facility]  . pessary  12/19/2015   [facility] Dr. Pamala Hurry  . ROBOTIC ASSISTED TOTAL HYSTERECTOMY WITH BILATERAL SALPINGO OOPHERECTOMY Bilateral 10/23/2016   Procedure: ROBOTIC ASSISTED TOTAL HYSTERECTOMY WITH BILATERAL SALPINGO OOPHORECTOMY/Uterosacral Ligament Suspension;  Surgeon: Aloha Gell, MD;  Location: [facility];  Service: Gynecology;  Laterality: Bilateral;  . SHOULDER SURGERY Bilateral Late 1980s   Frozen shoulders bilaterally, no surgery, had physical therapy  . Stress myoview  12/05   Low risk  .  TONSILLECTOMY  Late 1970s  . TUBAL LIGATION    . VAGINAL PROLAPSE REPAIR N/A 09/22/2017   Procedure: VAGINAL VAULT SUSPENSION AND GRAFT;  Surgeon: Bjorn Loser, MD;  Location: [facility];  Service: Urology;  Laterality: N/A;   Social History   Tobacco Use  . Smoking status: Former Smoker    Quit date: 05/21/1998    Years since quitting: 20.2  . Smokeless tobacco: Never Used  Substance Use Topics  . Alcohol use: No    Alcohol/week: 0.0 standard drinks  . Drug use: No   Family History  Problem Relation Age of Onset  . Asthma Mother   . Aortic aneurysm Mother   . Stroke Father   . Alzheimer's disease Father   . Cancer Paternal Aunt        Breast Cancer  . Breast cancer Paternal Aunt   . Cancer Paternal Aunt        Breast Cancer  . Breast cancer Paternal Aunt   . Cancer Paternal Aunt        Breast Cancer  . Breast cancer Paternal Aunt   . Stroke Maternal Uncle   . Hypertension Other        HTN on Father's side of family   Allergies  Allergen Reactions  . Amoxicillin-Pot Clavulanate Nausea Only and Other (See Comments)    REACTION: nausea (tolerates amoxil) Has patient had a PCN reaction causing immediate rash, facial/tongue/throat swelling, SOB or lightheadedness with hypotension: No Has patient had a PCN reaction causing severe rash involving mucus membranes or skin necrosis: Unknown Has patient had a PCN reaction that required hospitalization: No Has patient had a PCN reaction occurring within the last 10 years: No If all of the above answers are "NO", then may proceed with Cephalosporin use.   [name] Kitchen Omeprazole Other (See Comments)    ineffective   . Other Nausea And Vomiting and Other (See Comments)    Anesthesia-vomiting/dry heaves (up to ~12hrs after)   Current Outpatient Medications on File Prior to Visit  Medication Sig Dispense Refill  . acetaminophen (TYLENOL) 500 MG tablet Take 500 mg by mouth every 6 (six) hours as needed for moderate pain or headache.     .  Cholecalciferol (VITAMIN D3 PO) Take 5,000 Units by mouth daily.    [name] Kitchen NEXIUM 40 MG capsule Take 1 capsule (40 mg total) by mouth daily. 90 capsule 3  . Omega-3 Fatty Acids (FISH OIL PO) Take 1,900 mg by mouth daily.     No current facility-administered medications on file prior to visit.    Review of Systems  Constitutional: Negative for chills, fever and weight loss.       Mild fatigue  Not malaise  HENT: Positive for congestion, ear pain, sinus pain and sore throat. Negative for ear discharge, hearing loss  and tinnitus.        Mild sinus pain in cheek on the L under eye  Eyes: Negative for blurred vision, pain, discharge and redness.  Respiratory: Negative for cough, shortness of breath, wheezing and stridor.   Cardiovascular: Negative for chest pain and palpitations.  Gastrointestinal: Negative for diarrhea, heartburn, nausea and vomiting.  Musculoskeletal: Negative for myalgias.  Skin: Negative for itching.  Neurological: Negative for dizziness and tingling.       Dizziness is resolved      Observations/Objective: Pt sounds like her usual self  Not distressed Pleasant and talkative Not hoarse No sob or cough  Nl affect and cognition Good historian  Assessment and Plan: Problem List Items Addressed This Visit      Other   Acute otalgia, left    For over a week  Followed a bout of vertigo  Also some pnd on that side and congestion Strongly suspect OM  Px amoxicillin  Also enc to use flonase daily for 1-2 weeks to open E tube Continue tylenol prn   Watch for fever/cough Update if not starting to improve in a week or if worsening   Do not suspect covid currently - but she will let us know if she wants to be tested   Meds ordered this encounter  Medications  . amoxicillin (AMOXIL) 500 MG capsule    Sig: Take 1 capsule (500 mg total) by mouth 3 (three) times daily.    Dispense:  30 capsule    Refill:  0             Follow Up Instructions: Take amoxicillin as  directed for suspected ear infection Use flonase daily for at least 2 weeks If fever/cough or other symptoms call asap Update if not starting to improve in a week or if worsening     I discussed the assessment and treatment plan with the patient. The patient was provided an opportunity to ask questions and all were answered. The patient agreed with the plan and demonstrated an understanding of the instructions.   The patient was advised to call back or seek an in-person evaluation if the symptoms worsen or if the condition fails to improve as anticipated.  I provided 15 minutes of non-face-to-face time during this encounter.   Loura Pardon, MD

## 2018-08-11 NOTE — Assessment & Plan Note (Signed)
For over a week  Followed a bout of vertigo  Also some pnd on that side and congestion Strongly suspect OM  Px amoxicillin  Also enc to use flonase daily for 1-2 weeks to open E tube Continue tylenol prn   Watch for fever/cough Update if not starting to improve in a week or if worsening   Do not suspect covid currently - but she will let us know if she wants to be tested   Meds ordered this encounter  Medications  . amoxicillin (AMOXIL) 500 MG capsule    Sig: Take 1 capsule (500 mg total) by mouth 3 (three) times daily.    Dispense:  30 capsule    Refill:  0

## 2018-08-11 NOTE — Patient Instructions (Signed)
Take amoxicillin as directed for suspected ear infection Use flonase daily for at least 2 weeks If fever/cough or other symptoms call asap Update if not starting to improve in a week or if worsening

## 2018-08-13 ENCOUNTER — Other Ambulatory Visit: Payer: Self-pay

## 2018-08-13 ENCOUNTER — Telehealth: Payer: Self-pay | Admitting: Family Medicine

## 2018-08-13 DIAGNOSIS — Z20822 Contact with and (suspected) exposure to covid-19: Secondary | ICD-10-CM

## 2018-08-13 NOTE — Telephone Encounter (Signed)
Best number [phone]  Pt stated she took jeanne ingold to get  covid test today and she had one done also at [facility]  She has no symptoms

## 2018-08-14 LAB — NOVEL CORONAVIRUS, NAA: SARS-CoV-2, NAA: NOT DETECTED

## 2018-10-21 ENCOUNTER — Other Ambulatory Visit: Payer: Self-pay | Admitting: Family Medicine

## 2018-10-21 DIAGNOSIS — Z1231 Encounter for screening mammogram for malignant neoplasm of breast: Secondary | ICD-10-CM

## 2018-10-25 ENCOUNTER — Telehealth: Payer: Self-pay | Admitting: Family Medicine

## 2018-10-25 NOTE — Telephone Encounter (Signed)
Best number [phone] or [phone]  Pt was in on 6/2 for labs and phone visit for medicare wellness.  She is being billed 37. 12 veni puncture 25 metabolic panel cmp  She stated medicare says per her book they should pay 100%   please advise pt on what she needs to do She stated she goes through this every year

## 2018-10-25 NOTE — Telephone Encounter (Signed)
Junie Panning Public affairs consultant) discussed this with Aron Baba in our billing department.    Dawn states that she connected and has refiled this claim. She had to remove the z00.00 code from the labs because Medicare won't cover that particular code. She feels like this should correct the issue.   Shirlean Mylar,   Can you please call the patient and let her know that we are sorry and that we are taking steps to get this refilled and corrected as stated above.   Thanks.

## 2018-10-26 NOTE — Telephone Encounter (Signed)
Pt aware of mandy's comments

## 2018-12-06 ENCOUNTER — Ambulatory Visit
Admission: RE | Admit: 2018-12-06 | Discharge: 2018-12-06 | Disposition: A | Discharge status: Home / Self Care | Payer: Medicare Other | Source: Ambulatory Visit | Attending: Family Medicine | Admitting: Family Medicine

## 2018-12-06 ENCOUNTER — Other Ambulatory Visit: Payer: Self-pay

## 2018-12-06 DIAGNOSIS — Z1231 Encounter for screening mammogram for malignant neoplasm of breast: Secondary | ICD-10-CM

## 2018-12-23 ENCOUNTER — Other Ambulatory Visit: Payer: Self-pay | Admitting: Family Medicine

## 2018-12-30 ENCOUNTER — Telehealth: Payer: Self-pay | Admitting: *Deleted

## 2018-12-30 NOTE — Telephone Encounter (Signed)
PA for pt's Nexium was done at [email or web address].com. I will await a response

## 2018-12-31 NOTE — Telephone Encounter (Signed)
PA was approved. Pharmacy notified and letter placed in Dr. Tower's inbox to sign and send for scanning  

## 2019-01-10 ENCOUNTER — Ambulatory Visit: Payer: Medicare Other | Attending: Internal Medicine

## 2019-01-10 DIAGNOSIS — Z20822 Contact with and (suspected) exposure to covid-19: Secondary | ICD-10-CM

## 2019-01-11 ENCOUNTER — Telehealth: Payer: Self-pay | Admitting: *Deleted

## 2019-01-11 LAB — NOVEL CORONAVIRUS, NAA: SARS-CoV-2, NAA: NOT DETECTED

## 2019-01-11 NOTE — Telephone Encounter (Signed)
Patient called for results ,still pending . 

## 2019-01-12 ENCOUNTER — Telehealth: Payer: Self-pay

## 2019-01-12 NOTE — Telephone Encounter (Signed)
Pt notified of negative COVID-19 results. Understanding verbalized.  Gina Williams   

## 2019-02-23 DIAGNOSIS — H26493 Other secondary cataract, bilateral: Secondary | ICD-10-CM | POA: Diagnosis not present

## 2019-02-23 DIAGNOSIS — H52203 Unspecified astigmatism, bilateral: Secondary | ICD-10-CM | POA: Diagnosis not present

## 2019-02-23 DIAGNOSIS — H524 Presbyopia: Secondary | ICD-10-CM | POA: Diagnosis not present

## 2019-02-23 DIAGNOSIS — H04123 Dry eye syndrome of bilateral lacrimal glands: Secondary | ICD-10-CM | POA: Diagnosis not present

## 2019-03-18 ENCOUNTER — Other Ambulatory Visit: Payer: Self-pay

## 2019-03-18 ENCOUNTER — Ambulatory Visit: Payer: Medicare Other | Attending: Internal Medicine

## 2019-03-18 DIAGNOSIS — Z23 Encounter for immunization: Secondary | ICD-10-CM

## 2019-03-18 NOTE — Progress Notes (Signed)
   Covid-19 Vaccination Clinic  Name:  Gina Williams    MRN: [id number] DOB: September 18, 1945  03/18/2019  Ms. Gina Williams was observed post Covid-19 immunization for 15 minutes without incidence. She was provided with Vaccine Information Sheet and instruction to access the V-Safe system.   Ms. Gina Williams was instructed to call 911 with any severe reactions post vaccine: [name] Kitchen Difficulty breathing  . Swelling of your face and throat  . A fast heartbeat  . A bad rash all over your body  . Dizziness and weakness    Immunizations Administered    Name Date Dose VIS Date Route   Pfizer COVID-19 Vaccine 03/18/2019 11:48 AM 0.3 mL 12/31/2018 Intramuscular   Manufacturer: [place]   Lot: [id number]   [place]: [id number]

## 2019-04-07 ENCOUNTER — Telehealth: Payer: Self-pay | Admitting: Family Medicine

## 2019-04-07 MED ORDER — ESOMEPRAZOLE MAGNESIUM 40 MG PO CPDR: 40.0000 mg | DELAYED_RELEASE_CAPSULE | Freq: Every day | ORAL | 1 refills | Status: DC

## 2019-04-07 NOTE — Telephone Encounter (Signed)
No problem getting the 2nd shot   I sent a month of generic nexium to [facility]

## 2019-04-07 NOTE — Telephone Encounter (Signed)
Pt notified Rx sent to pharmacy and advised she is still okay to get 2nd vaccine

## 2019-04-07 NOTE — Telephone Encounter (Signed)
Patient called and said she'd like to switch from Nexium to generic Nexium.  Patient would like a 1 month supply called in to [facility] to try. Patient wants to switch because the Nexium is too expensive. Also, patient is getting her second Covid shot on 04/12/19.  Patient had a tooth extracted yesterday.  Patient wants to know if there would be any reason why she shouldn't get the second shot, due to the tooth extraction.

## 2019-04-12 ENCOUNTER — Ambulatory Visit: Payer: Medicare Other | Attending: Internal Medicine

## 2019-04-12 DIAGNOSIS — Z23 Encounter for immunization: Secondary | ICD-10-CM

## 2019-04-12 NOTE — Progress Notes (Signed)
   Covid-19 Vaccination Clinic  Name:  Kexin Zelenak    MRN: [id number] DOB: 15-Mar-1945  04/12/2019  Ms. Mcgarrigle was observed post Covid-19 immunization for 15 minutes without incident. She was provided with Vaccine Information Sheet and instruction to access the V-Safe system.   Ms. Vile was instructed to call 911 with any severe reactions post vaccine: [name] Kitchen Difficulty breathing  . Swelling of face and throat  . A fast heartbeat  . A bad rash all over body  . Dizziness and weakness   Immunizations Administered    Name Date Dose VIS Date Route   Pfizer COVID-19 Vaccine 04/12/2019  4:04 PM 0.3 mL 12/31/2018 Intramuscular   Manufacturer: [place]   Lot: [id number]   [place]: [id number]

## 2019-06-21 ENCOUNTER — Telehealth: Payer: Self-pay | Admitting: Family Medicine

## 2019-06-21 DIAGNOSIS — I700 Atherosclerosis of aorta: Secondary | ICD-10-CM

## 2019-06-21 DIAGNOSIS — R5382 Chronic fatigue, unspecified: Secondary | ICD-10-CM

## 2019-06-21 DIAGNOSIS — R7309 Other abnormal glucose: Secondary | ICD-10-CM

## 2019-06-21 DIAGNOSIS — R5383 Other fatigue: Secondary | ICD-10-CM | POA: Insufficient documentation

## 2019-06-21 DIAGNOSIS — E7800 Pure hypercholesterolemia, unspecified: Secondary | ICD-10-CM

## 2019-06-21 NOTE — Telephone Encounter (Signed)
-----   Message from Ellamae Sia sent at 06/17/2019  2:45 PM EDT ----- Regarding: lab orders for Wednesday, 6.2.21  AWV lab orders, please.

## 2019-06-22 ENCOUNTER — Other Ambulatory Visit (INDEPENDENT_AMBULATORY_CARE_PROVIDER_SITE_OTHER): Payer: Medicare Other

## 2019-06-22 ENCOUNTER — Ambulatory Visit: Payer: Medicare Other

## 2019-06-22 ENCOUNTER — Other Ambulatory Visit: Payer: Medicare Other

## 2019-06-22 DIAGNOSIS — R5382 Chronic fatigue, unspecified: Secondary | ICD-10-CM

## 2019-06-22 DIAGNOSIS — R7309 Other abnormal glucose: Secondary | ICD-10-CM | POA: Diagnosis not present

## 2019-06-22 DIAGNOSIS — E7800 Pure hypercholesterolemia, unspecified: Secondary | ICD-10-CM | POA: Diagnosis not present

## 2019-06-22 LAB — COMPREHENSIVE METABOLIC PANEL
ALT: 14 U/L (ref 0–35)
AST: 16 U/L (ref 0–37)
Albumin: 4.2 g/dL (ref 3.5–5.2)
Alkaline Phosphatase: 61 U/L (ref 39–117)
BUN: 16 mg/dL (ref 6–23)
CO2: 29 mEq/L (ref 19–32)
Calcium: 9.3 mg/dL (ref 8.4–10.5)
Chloride: 107 mEq/L (ref 96–112)
Creatinine, Ser: 0.95 mg/dL (ref 0.40–1.20)
GFR: 57.41 mL/min — ABNORMAL LOW (ref >60.00)
Glucose, Bld: 87 mg/dL (ref 70–99)
Potassium: 4.4 mEq/L (ref 3.5–5.1)
Sodium: 140 mEq/L (ref 135–145)
Total Bilirubin: 0.6 mg/dL (ref 0.2–1.2)
Total Protein: 7 g/dL (ref 6.0–8.3)

## 2019-06-22 LAB — CBC WITH DIFFERENTIAL/PLATELET
Basophils Absolute: 0.1 10*3/uL (ref 0.0–0.1)
Basophils Relative: 0.9 % (ref 0.0–3.0)
Eosinophils Absolute: 0.2 10*3/uL (ref 0.0–0.7)
Eosinophils Relative: 2.4 % (ref 0.0–5.0)
HCT: 42.5 % (ref 36.0–46.0)
Hemoglobin: 14.3 g/dL (ref 12.0–15.0)
Lymphocytes Relative: 26.7 % (ref 12.0–46.0)
Lymphs Abs: 1.9 10*3/uL (ref 0.7–4.0)
MCHC: 33.7 g/dL (ref 30.0–36.0)
MCV: 89 fl (ref 78.0–100.0)
Monocytes Absolute: 0.7 10*3/uL (ref 0.1–1.0)
Monocytes Relative: 9.2 % (ref 3.0–12.0)
Neutro Abs: 4.4 10*3/uL (ref 1.4–7.7)
Neutrophils Relative %: 60.8 % (ref 43.0–77.0)
Platelets: 272 10*3/uL (ref 150.0–400.0)
RBC: 4.77 Mil/uL (ref 3.87–5.11)
RDW: 13.4 % (ref 11.5–15.5)
WBC: 7.2 10*3/uL (ref 4.0–10.5)

## 2019-06-22 LAB — LIPID PANEL
Cholesterol: 193 mg/dL (ref 0–200)
HDL: 49.9 mg/dL (ref >39.00)
LDL Cholesterol: 119 mg/dL — ABNORMAL HIGH (ref 0–99)
NonHDL: 142.97
Total CHOL/HDL Ratio: 4
Triglycerides: 118 mg/dL (ref 0.0–149.0)
VLDL: 23.6 mg/dL (ref 0.0–40.0)

## 2019-06-22 LAB — HEMOGLOBIN A1C: Hgb A1c MFr Bld: 5.8 % (ref 4.6–6.5)

## 2019-06-22 LAB — TSH: TSH: 1.65 u[IU]/mL (ref 0.35–4.50)

## 2019-06-28 ENCOUNTER — Ambulatory Visit: Payer: Medicare Other

## 2019-06-29 ENCOUNTER — Ambulatory Visit (INDEPENDENT_AMBULATORY_CARE_PROVIDER_SITE_OTHER): Payer: Medicare Other | Admitting: Family Medicine

## 2019-06-29 ENCOUNTER — Other Ambulatory Visit: Payer: Self-pay

## 2019-06-29 ENCOUNTER — Encounter: Payer: Self-pay | Admitting: Family Medicine

## 2019-06-29 VITALS — BP 122/70 | HR 80 | Temp 97.7°F | Ht 63.5 in | Wt 139.3 lb

## 2019-06-29 DIAGNOSIS — M8589 Other specified disorders of bone density and structure, multiple sites: Secondary | ICD-10-CM | POA: Diagnosis not present

## 2019-06-29 DIAGNOSIS — Z853 Personal history of malignant neoplasm of breast: Secondary | ICD-10-CM

## 2019-06-29 DIAGNOSIS — I700 Atherosclerosis of aorta: Secondary | ICD-10-CM | POA: Diagnosis not present

## 2019-06-29 DIAGNOSIS — E7800 Pure hypercholesterolemia, unspecified: Secondary | ICD-10-CM

## 2019-06-29 DIAGNOSIS — Z0000 Encounter for general adult medical examination without abnormal findings: Secondary | ICD-10-CM

## 2019-06-29 DIAGNOSIS — R7309 Other abnormal glucose: Secondary | ICD-10-CM

## 2019-06-29 MED ORDER — ESOMEPRAZOLE MAGNESIUM 40 MG PO CPDR: 40.0000 mg | DELAYED_RELEASE_CAPSULE | Freq: Every day | ORAL | 3 refills | Status: DC

## 2019-06-29 MED ORDER — FLUTICASONE PROPIONATE 50 MCG/ACT NA SUSP: 2.0000 | Freq: Every day | NASAL | 3 refills | Status: DC | PRN

## 2019-06-29 NOTE — Assessment & Plan Note (Addendum)
Reviewed health habits including diet and exercise and skin cancer prevention Reviewed appropriate screening tests for age  Also reviewed health mt list, fam hx and immunization status , as well as social and family history   See HPI Declines dexa (no falls or fx) Pt declines most immunizations unfortunately  Enc exercise  Considering renewing adv directive-materials given  No cognitive concerns Care team reviewed  Nl hearing screen  utd vision/eye exam  Labs reviewed for chronic medical problems

## 2019-06-29 NOTE — Progress Notes (Signed)
Subjective:    Patient ID: Gina Williams, female    DOB: 02-29-1945, 74 y.o.   MRN: [id number]  This visit occurred during the SARS-CoV-2 public health emergency.  Safety protocols were in place, including screening questions prior to the visit, additional usage of staff PPE, and extensive cleaning of exam room while observing appropriate contact time as indicated for disinfecting solutions.    HPI Pt presents for amw and f/u of chronic medical problems   I have personally reviewed the Medicare Annual Wellness questionnaire and have noted 1. The patient's medical and social history 2. Their use of alcohol, tobacco or illicit drugs 3. Their current medications and supplements 4. The patient's functional ability including ADL's, fall risks, home safety risks and hearing or visual             impairment. 5. Diet and physical activities 6. Evidence for depression or mood disorders  The patients weight, height, BMI have been recorded in the chart and visual acuity is per eye clinic.  I have made referrals, counseling and provided education to the patient based review of the above and I have provided the pt with a written personalized care plan for preventive services. Reviewed and updated provider list, see scanned forms.  See scanned forms.  Routine anticipatory guidance given to patient.  See health maintenance. Colon cancer screening 1/15 colonoscopy  Breast cancer screening  Mammogram 11/20  Personal h/o breast cancer -nothing new Self breast exam- no lumps or changes  Flu vaccine -declines  covid status -vaccinated Tetanus vaccine   Td 6/14 Pneumovax- declines  Zoster vaccine- declines  Dexa 1/17 -stable osteopenia  Falls- none Fractures-none  Supplements -taking vit D / tums for ca  Exercise -not a low / active lifestyle and gardens and mows  Advance directive-has one but has to update (given materials) Cognitive function addressed- see scanned forms- and if  abnormal then additional documentation follows.  No concerns at all- she takes care of her family's affairs (5 people's checkbooks)    Care team  Denisha Hoel-pcp Tanner-opthy Amelia- dentist    PMH and SH reviewed  Meds, vitals, and allergies reviewed.   ROS: See HPI.  Otherwise negative.    Weight : Wt Readings from Last 3 Encounters:  06/29/19 139 lb 5 oz (63.2 kg)  06/29/18 135 lb (61.2 kg)  06/22/18 133 lb 9.6 oz (60.6 kg)   24.29 kg/m   BP Readings from Last 3 Encounters:  06/29/19 122/70  06/29/18 107/68  06/22/18 106/68   Pulse Readings from Last 3 Encounters:  06/29/19 80  06/29/18 89  06/22/18 90     Hearing/vision:  Hearing Screening   125Hz  250Hz  500Hz  1000Hz  2000Hz  3000Hz  4000Hz  6000Hz  8000Hz   Right ear:   40 40 40  40    Left ear:   40 40 40  40    Vision Screening Comments: Had eye January 2021    Hyperlipidemia Lab Results  Component Value Date   CHOL 193 06/22/2019   CHOL 208 (H) 06/22/2018   CHOL 174 06/18/2017   Lab Results  Component Value Date   HDL 49.90 06/22/2019   HDL 57.10 06/22/2018   HDL 52.60 06/18/2017   Lab Results  Component Value Date   LDLCALC 119 (H) 06/22/2019   LDLCALC 116 (H) 06/22/2018   LDLCALC 100 (H) 06/18/2017   Lab Results  Component Value Date   TRIG 118.0 06/22/2019   TRIG 175.0 (H) 06/22/2018   TRIG 106.0 06/18/2017  Lab Results  Component Value Date   CHOLHDL 4 06/22/2019   CHOLHDL 4 06/22/2018   CHOLHDL 3 06/18/2017   Lab Results  Component Value Date   LDLDIRECT 156.2 10/27/2012   Diet is fair  Has eaten more red meat than usual  Diet -good and bad days   No bacon or sausage  Some ice cream  Eats baked fish    H/o elevated glucose Lab Results  Component Value Date   HGBA1C 5.8 06/22/2019  down from 6.0    Other labs Lab Results  Component Value Date   NA 140 06/22/2019   K 4.4 06/22/2019   CO2 29 06/22/2019   GLUCOSE 87 06/22/2019   BUN 16 06/22/2019    CREATININE 0.95 06/22/2019   CALCIUM 9.3 06/22/2019   GFRNONAA >60 09/23/2017   GFRAA >60 09/23/2017   Lab Results  Component Value Date   ALT 14 06/22/2019   AST 16 06/22/2019   ALKPHOS 61 06/22/2019   BILITOT 0.6 06/22/2019   Lab Results  Component Value Date   WBC 7.2 06/22/2019   HGB 14.3 06/22/2019   HCT 42.5 06/22/2019   MCV 89.0 06/22/2019   PLT 272.0 06/22/2019   Lab Results  Component Value Date   TSH 1.65 06/22/2019     Patient Active Problem List   Diagnosis Date Noted  . Fatigue 06/21/2019  . Parent refuses immunizations 06/29/2018  . Elevated glucose level 06/20/2018  . Cystocele with prolapse 09/22/2017  . Left shoulder pain 06/25/2017  . S/P hysterectomy 10/23/2016  . Pre-operative examination 09/23/2016  . Atherosclerosis of aorta ([place]) 09/23/2016  . Need for hepatitis C screening test 12/23/2015  . Routine general medical examination at a health care facility 12/01/2014  . Estrogen deficiency 12/01/2014  . Osteopenia 11/28/2013  . Colon cancer screening 11/03/2012  . Hyperlipidemia 11/03/2012  . Encounter for Medicare annual wellness exam 10/26/2012  . GERD 01/23/2010  . History of breast cancer 01/23/2010   Past Medical History:  Diagnosis Date  . Arthritis    hands  . Breast cancer ([place]) 09/16/2007   Mastectomy  . Dyspnea    last 5 months, lack of conditioning  . GERD (gastroesophageal reflux disease)   . Hypoglycemic reaction    fainted in her youth , hadnt eaten anything for hours and was assisting with a vet surgery , hasnt reoccurred since  , but is aware she needs to eat   . Osteopenia    In the past, now nl. BMD  . Personal history of chemotherapy   . PONV (postoperative nausea and vomiting)    SEVERE, patient is very concerned about this issue occurred with mastectomy sugery ; no issues whn she had hysterectomy   . Vertigo 2007   Past Surgical History:  Procedure Laterality Date  . ANTERIOR AND POSTERIOR REPAIR N/A 09/22/2017    Procedure: CYSTOSCOPY ANTERIOR (CYSTOCELE);  Surgeon: Bjorn Loser, MD;  Location: [facility];  Service: Urology;  Laterality: N/A;  . CATARACT EXTRACTION, BILATERAL    . COLONOSCOPY    . CYSTOSCOPY N/A 10/23/2016   Procedure: CYSTOSCOPY;  Surgeon: Aloha Gell, MD;  Location: [facility];  Service: Gynecology;  Laterality: N/A;  . GANGLION CYST EXCISION Left 1970s   Multiple surgeries  . HAND SURGERY Left 10/2005   Nerve damage  . HAND SURGERY Left 2011   Torn ligament surgery  . MASTECTOMY  8/09   Breast Cancer  . Nuclear Stress Test     Negative  EF 77%  .  pap  08/05/2014   [facility]  . pessary  12/19/2015   [facility] Dr. Pamala Hurry  . ROBOTIC ASSISTED TOTAL HYSTERECTOMY WITH BILATERAL SALPINGO OOPHERECTOMY Bilateral 10/23/2016   Procedure: ROBOTIC ASSISTED TOTAL HYSTERECTOMY WITH BILATERAL SALPINGO OOPHORECTOMY/Uterosacral Ligament Suspension;  Surgeon: Aloha Gell, MD;  Location: [facility];  Service: Gynecology;  Laterality: Bilateral;  . SHOULDER SURGERY Bilateral Late 1980s   Frozen shoulders bilaterally, no surgery, had physical therapy  . Stress myoview  12/05   Low risk  . TONSILLECTOMY  Late 1970s  . TUBAL LIGATION    . VAGINAL PROLAPSE REPAIR N/A 09/22/2017   Procedure: VAGINAL VAULT SUSPENSION AND GRAFT;  Surgeon: Bjorn Loser, MD;  Location: [facility];  Service: Urology;  Laterality: N/A;   Social History   Tobacco Use  . Smoking status: Former Smoker    Quit date: 05/21/1998    Years since quitting: 21.1  . Smokeless tobacco: Never Used  Substance Use Topics  . Alcohol use: No    Alcohol/week: 0.0 standard drinks  . Drug use: No   Family History  Problem Relation Age of Onset  . Asthma Mother   . Aortic aneurysm Mother   . Stroke Father   . Alzheimer's disease Father   . Cancer Paternal Aunt        Breast Cancer  . Breast cancer Paternal Aunt   . Cancer Paternal Aunt        Breast Cancer  . Breast cancer Paternal Aunt   . Cancer Paternal Aunt         Breast Cancer  . Breast cancer Paternal Aunt   . Stroke Maternal Uncle   . Hypertension Other        HTN on Father's side of family   Allergies  Allergen Reactions  . Amoxicillin-Pot Clavulanate Nausea Only and Other (See Comments)    REACTION: nausea (tolerates amoxil) Has patient had a PCN reaction causing immediate rash, facial/tongue/throat swelling, SOB or lightheadedness with hypotension: No Has patient had a PCN reaction causing severe rash involving mucus membranes or skin necrosis: Unknown Has patient had a PCN reaction that required hospitalization: No Has patient had a PCN reaction occurring within the last 10 years: No If all of the above answers are "NO", then may proceed with Cephalosporin use.   [name] Kitchen Omeprazole Other (See Comments)    ineffective   . Other Nausea And Vomiting and Other (See Comments)    Anesthesia-vomiting/dry heaves (up to ~12hrs after)   Current Outpatient Medications on File Prior to Visit  Medication Sig Dispense Refill  . acetaminophen (TYLENOL) 500 MG tablet Take 500 mg by mouth every 6 (six) hours as needed for moderate pain or headache.     . Cholecalciferol (VITAMIN D3 PO) Take 2,000 Units by mouth daily.     . diphenhydrAMINE (BENADRYL) 25 mg capsule Take 25 mg by mouth at bedtime.    . Omega-3 Fatty Acids (FISH OIL PO) Take 1,900 mg by mouth daily.     No current facility-administered medications on file prior to visit.    Review of Systems  Constitutional: Negative for activity change, appetite change, fatigue, fever and unexpected weight change.  HENT: Negative for congestion, ear pain, rhinorrhea, sinus pressure and sore throat.   Eyes: Negative for pain, redness and visual disturbance.  Respiratory: Negative for cough, shortness of breath and wheezing.   Cardiovascular: Negative for chest pain and palpitations.  Gastrointestinal: Negative for abdominal pain, blood in stool, constipation and diarrhea.  Endocrine: Negative  for  polydipsia and polyuria.  Genitourinary: Negative for dysuria, frequency and urgency.  Musculoskeletal: Negative for arthralgias, back pain and myalgias.  Skin: Negative for pallor and rash.  Allergic/Immunologic: Negative for environmental allergies.  Neurological: Negative for dizziness, syncope and headaches.  Hematological: Negative for adenopathy. Does not bruise/bleed easily.  Psychiatric/Behavioral: Negative for decreased concentration and dysphoric mood. The patient is not nervous/anxious.        Objective:   Physical Exam Constitutional:      General: She is not in acute distress.    Appearance: Normal appearance. She is well-developed and normal weight. She is not ill-appearing or diaphoretic.  HENT:     Head: Normocephalic and atraumatic.     Right Ear: Tympanic membrane, ear canal and external ear normal.     Left Ear: Tympanic membrane, ear canal and external ear normal.     Nose: Nose normal. No congestion.     Mouth/Throat:     Mouth: Mucous membranes are moist.     Pharynx: Oropharynx is clear. No posterior oropharyngeal erythema.  Eyes:     General: No scleral icterus.    Extraocular Movements: Extraocular movements intact.     Conjunctiva/sclera: Conjunctivae normal.     Pupils: Pupils are equal, round, and reactive to light.  Neck:     Thyroid: No thyromegaly.     Vascular: No carotid bruit or JVD.  Cardiovascular:     Rate and Rhythm: Normal rate and regular rhythm.     Pulses: Normal pulses.     Heart sounds: Normal heart sounds. No gallop.   Pulmonary:     Effort: Pulmonary effort is normal. No respiratory distress.     Breath sounds: Normal breath sounds. No wheezing.     Comments: Good air exch Chest:     Chest wall: No tenderness.  Abdominal:     General: Bowel sounds are normal. There is no distension or abdominal bruit.     Palpations: Abdomen is soft. There is no mass.     Tenderness: There is no abdominal tenderness.     Hernia: No hernia is  present.  Genitourinary:    Comments: Breast exam L No mass, nodules, thickening, tenderness, bulging, retraction, inflamation, nipple discharge or skin changes noted.  No axillary or clavicular LA.     R mastectomy site w/o change  No M or tenderness   Musculoskeletal:        General: No tenderness. Normal range of motion.     Cervical back: Normal range of motion and neck supple. No rigidity. No muscular tenderness.     Right lower leg: No edema.     Left lower leg: No edema.     Comments: No kyphosis   Lymphadenopathy:     Cervical: No cervical adenopathy.  Skin:    General: Skin is warm and dry.     Coloration: Skin is not pale.     Findings: No erythema or rash.     Comments: Solar lentigines diffusely Some sks  Neurological:     Mental Status: She is alert. Mental status is at baseline.     Cranial Nerves: No cranial nerve deficit.     Motor: No abnormal muscle tone.     Coordination: Coordination normal.     Gait: Gait normal.     Deep Tendon Reflexes: Reflexes are normal and symmetric. Reflexes normal.  Psychiatric:        Mood and Affect: Mood normal.  Cognition and Memory: Cognition and memory normal.           Assessment & Plan:   Problem List Items Addressed This Visit      Cardiovascular and Mediastinum   Atherosclerosis of aorta (H[facility])    No clinical changes Watching bp and cholesterol level        Musculoskeletal and Integument   Osteopenia    dexa 1/17  She is not interested in f/u dexa at this time No falls or fractures  Taking D and tums for ca  Enc weight bearing exercise         Other   History of breast cancer    Pt continues to do well , no re occurrence       Encounter for Medicare annual wellness exam - Primary    Reviewed health habits including diet and exercise and skin cancer prevention Reviewed appropriate screening tests for age  Also reviewed health mt list, fam hx and immunization status , as well as social and  family history   See HPI Declines dexa (no falls or fx) Pt declines most immunizations unfortunately  Enc exercise  Considering renewing adv directive-materials given  No cognitive concerns Care team reviewed  Nl hearing screen  utd vision/eye exam  Labs reviewed for chronic medical problems       Hyperlipidemia    Disc goals for lipids and reasons to control them Rev last labs with pt Rev low sat fat diet in detail  LDL is fairly stable at 119 HDL over 40  Plans to cut back on red meat       Elevated glucose level    Good control with diet  Lab Results  Component Value Date   HGBA1C 5.8 06/22/2019   disc imp of low glycemic diet and wt loss to prevent DM2

## 2019-06-29 NOTE — Assessment & Plan Note (Signed)
Pt continues to do well , no re occurrence

## 2019-06-29 NOTE — Assessment & Plan Note (Signed)
No clinical changes Watching bp and cholesterol level

## 2019-06-29 NOTE — Assessment & Plan Note (Signed)
Good control with diet  Lab Results  Component Value Date   HGBA1C 5.8 06/22/2019   disc imp of low glycemic diet and wt loss to prevent DM2

## 2019-06-29 NOTE — Patient Instructions (Addendum)
Take a look at the blue packet if you want to update your advance directive   For cholesterol Avoid red meat/ fried foods/ egg yolks/ fatty breakfast meats/ butter, cheese and high fat dairy/ and shellfish   Perhaps-cut back on ice cream   Take care of yourself  Try to fit in exercise when you can

## 2019-06-29 NOTE — Assessment & Plan Note (Signed)
dexa 1/17  She is not interested in f/u dexa at this time No falls or fractures  Taking D and tums for ca  Enc weight bearing exercise

## 2019-06-29 NOTE — Assessment & Plan Note (Signed)
Disc goals for lipids and reasons to control them Rev last labs with pt Rev low sat fat diet in detail  LDL is fairly stable at 119 HDL over 40  Plans to cut back on red meat

## 2019-09-14 ENCOUNTER — Other Ambulatory Visit: Payer: Self-pay

## 2019-09-14 ENCOUNTER — Other Ambulatory Visit: Payer: Medicare Other

## 2019-09-14 DIAGNOSIS — Z20822 Contact with and (suspected) exposure to covid-19: Secondary | ICD-10-CM

## 2019-09-15 LAB — SARS-COV-2, NAA 2 DAY TAT

## 2019-09-15 LAB — NOVEL CORONAVIRUS, NAA: SARS-CoV-2, NAA: NOT DETECTED

## 2019-11-01 ENCOUNTER — Other Ambulatory Visit: Payer: Self-pay | Admitting: Family Medicine

## 2019-11-01 DIAGNOSIS — Z1231 Encounter for screening mammogram for malignant neoplasm of breast: Secondary | ICD-10-CM

## 2019-12-07 ENCOUNTER — Ambulatory Visit
Admission: RE | Admit: 2019-12-07 | Discharge: 2019-12-07 | Disposition: A | Discharge status: Home / Self Care | Payer: Medicare Other | Source: Ambulatory Visit | Attending: Family Medicine | Admitting: Family Medicine

## 2019-12-07 ENCOUNTER — Other Ambulatory Visit: Payer: Self-pay

## 2019-12-07 DIAGNOSIS — Z1231 Encounter for screening mammogram for malignant neoplasm of breast: Secondary | ICD-10-CM | POA: Diagnosis not present

## 2019-12-14 ENCOUNTER — Telehealth: Payer: Self-pay | Admitting: Family Medicine

## 2019-12-14 NOTE — Telephone Encounter (Signed)
Pt notified she hadn't received the letter saying it was normal, she just wanted to make sure before she scheduled her covid booster

## 2019-12-14 NOTE — Telephone Encounter (Signed)
Patient called wanting to know if we have the results from her mammogram. please call her back at [phone]

## 2019-12-20 DIAGNOSIS — L821 Other seborrheic keratosis: Secondary | ICD-10-CM | POA: Diagnosis not present

## 2019-12-20 DIAGNOSIS — L820 Inflamed seborrheic keratosis: Secondary | ICD-10-CM | POA: Diagnosis not present

## 2020-01-17 ENCOUNTER — Ambulatory Visit: Payer: Medicare Other | Attending: Internal Medicine

## 2020-01-17 ENCOUNTER — Other Ambulatory Visit: Payer: Self-pay | Admitting: Internal Medicine

## 2020-01-17 DIAGNOSIS — Z23 Encounter for immunization: Secondary | ICD-10-CM

## 2020-01-17 NOTE — Progress Notes (Signed)
   Covid-19 Vaccination Clinic  Name:  Gina Williams    MRN: [id number] DOB: 1945-07-26  01/17/2020  [name] was observed post Covid-19 immunization for 15 minutes without incident. She was provided with Vaccine Information Sheet and instruction to access the V-Safe system.   [name] was instructed to call 911 with any severe reactions post vaccine: [name] Kitchen Difficulty breathing  . Swelling of face and throat  . A fast heartbeat  . A bad rash all over body  . Dizziness and weakness   Immunizations Administered    Name Date Dose VIS Date Route   Pfizer COVID-19 Vaccine 01/17/2020  2:24 PM 0.3 mL 11/09/2019 Intramuscular   Manufacturer: [employer], [employer]   Lot: [id number]   NDC: [address]-3

## 2020-03-14 DIAGNOSIS — H43813 Vitreous degeneration, bilateral: Secondary | ICD-10-CM | POA: Diagnosis not present

## 2020-03-14 DIAGNOSIS — H524 Presbyopia: Secondary | ICD-10-CM | POA: Diagnosis not present

## 2020-03-14 DIAGNOSIS — H26493 Other secondary cataract, bilateral: Secondary | ICD-10-CM | POA: Diagnosis not present

## 2020-03-14 DIAGNOSIS — H04123 Dry eye syndrome of bilateral lacrimal glands: Secondary | ICD-10-CM | POA: Diagnosis not present

## 2020-04-19 DIAGNOSIS — D225 Melanocytic nevi of trunk: Secondary | ICD-10-CM | POA: Diagnosis not present

## 2020-04-19 DIAGNOSIS — D1801 Hemangioma of skin and subcutaneous tissue: Secondary | ICD-10-CM | POA: Diagnosis not present

## 2020-04-19 DIAGNOSIS — L821 Other seborrheic keratosis: Secondary | ICD-10-CM | POA: Diagnosis not present

## 2020-04-19 DIAGNOSIS — L570 Actinic keratosis: Secondary | ICD-10-CM | POA: Diagnosis not present

## 2020-04-19 DIAGNOSIS — L814 Other melanin hyperpigmentation: Secondary | ICD-10-CM | POA: Diagnosis not present

## 2020-04-19 DIAGNOSIS — L738 Other specified follicular disorders: Secondary | ICD-10-CM | POA: Diagnosis not present

## 2020-04-19 DIAGNOSIS — L723 Sebaceous cyst: Secondary | ICD-10-CM | POA: Diagnosis not present

## 2020-04-19 DIAGNOSIS — D2271 Melanocytic nevi of right lower limb, including hip: Secondary | ICD-10-CM | POA: Diagnosis not present

## 2020-04-19 DIAGNOSIS — D2272 Melanocytic nevi of left lower limb, including hip: Secondary | ICD-10-CM | POA: Diagnosis not present

## 2020-04-24 DIAGNOSIS — H26491 Other secondary cataract, right eye: Secondary | ICD-10-CM | POA: Diagnosis not present

## 2020-04-24 HISTORY — PX: YAG LASER APPLICATION: SHX6189

## 2020-05-22 DIAGNOSIS — H26492 Other secondary cataract, left eye: Secondary | ICD-10-CM | POA: Diagnosis not present

## 2020-08-22 ENCOUNTER — Telehealth: Payer: Self-pay | Admitting: Family Medicine

## 2020-08-22 NOTE — Telephone Encounter (Signed)
LVM for pt to rtn my call to r/s appt with [facility].

## 2020-08-23 ENCOUNTER — Telehealth: Payer: Self-pay | Admitting: Family Medicine

## 2020-08-23 DIAGNOSIS — R350 Frequency of micturition: Secondary | ICD-10-CM | POA: Diagnosis not present

## 2020-08-23 DIAGNOSIS — N8111 Cystocele, midline: Secondary | ICD-10-CM | POA: Diagnosis not present

## 2020-08-23 DIAGNOSIS — R351 Nocturia: Secondary | ICD-10-CM | POA: Diagnosis not present

## 2020-08-23 NOTE — Telephone Encounter (Signed)
LVM for pt to rtn my call to r/s appt with [facility].

## 2020-09-05 ENCOUNTER — Telehealth: Payer: Self-pay | Admitting: Family Medicine

## 2020-09-05 DIAGNOSIS — E7800 Pure hypercholesterolemia, unspecified: Secondary | ICD-10-CM

## 2020-09-05 DIAGNOSIS — R5382 Chronic fatigue, unspecified: Secondary | ICD-10-CM

## 2020-09-05 DIAGNOSIS — R7309 Other abnormal glucose: Secondary | ICD-10-CM

## 2020-09-05 NOTE — Telephone Encounter (Signed)
-----   Message from Ellamae Sia sent at 08/20/2020 10:11 AM EDT ----- Regarding: Lab orders for Thursday, 8.18.22  AWV lab orders, please.

## 2020-09-06 ENCOUNTER — Other Ambulatory Visit: Payer: Medicare Other

## 2020-09-06 ENCOUNTER — Ambulatory Visit: Payer: Medicare Other

## 2020-09-10 ENCOUNTER — Other Ambulatory Visit (HOSPITAL_COMMUNITY): Payer: Self-pay

## 2020-09-14 ENCOUNTER — Encounter: Payer: Medicare Other | Admitting: Family Medicine

## 2020-09-27 ENCOUNTER — Other Ambulatory Visit (INDEPENDENT_AMBULATORY_CARE_PROVIDER_SITE_OTHER): Payer: Medicare Other

## 2020-09-27 ENCOUNTER — Other Ambulatory Visit: Payer: Self-pay

## 2020-09-27 DIAGNOSIS — R5382 Chronic fatigue, unspecified: Secondary | ICD-10-CM | POA: Diagnosis not present

## 2020-09-27 DIAGNOSIS — E7800 Pure hypercholesterolemia, unspecified: Secondary | ICD-10-CM | POA: Diagnosis not present

## 2020-09-27 DIAGNOSIS — R7309 Other abnormal glucose: Secondary | ICD-10-CM

## 2020-09-27 LAB — CBC WITH DIFFERENTIAL/PLATELET
Basophils Absolute: 0.1 10*3/uL (ref 0.0–0.1)
Basophils Relative: 0.9 % (ref 0.0–3.0)
Eosinophils Absolute: 0.2 10*3/uL (ref 0.0–0.7)
Eosinophils Relative: 2.3 % (ref 0.0–5.0)
HCT: 42.6 % (ref 36.0–46.0)
Hemoglobin: 14.1 g/dL (ref 12.0–15.0)
Lymphocytes Relative: 24.3 % (ref 12.0–46.0)
Lymphs Abs: 1.9 10*3/uL (ref 0.7–4.0)
MCHC: 33 g/dL (ref 30.0–36.0)
MCV: 87.1 fl (ref 78.0–100.0)
Monocytes Absolute: 0.6 10*3/uL (ref 0.1–1.0)
Monocytes Relative: 8.4 % (ref 3.0–12.0)
Neutro Abs: 5 10*3/uL (ref 1.4–7.7)
Neutrophils Relative %: 64.1 % (ref 43.0–77.0)
Platelets: 266 10*3/uL (ref 150.0–400.0)
RBC: 4.89 Mil/uL (ref 3.87–5.11)
RDW: 13.5 % (ref 11.5–15.5)
WBC: 7.7 10*3/uL (ref 4.0–10.5)

## 2020-09-27 LAB — LIPID PANEL
Cholesterol: 191 mg/dL (ref 0–200)
HDL: 47.5 mg/dL (ref >39.00)
LDL Cholesterol: 122 mg/dL — ABNORMAL HIGH (ref 0–99)
NonHDL: 143.19
Total CHOL/HDL Ratio: 4
Triglycerides: 105 mg/dL (ref 0.0–149.0)
VLDL: 21 mg/dL (ref 0.0–40.0)

## 2020-09-27 LAB — COMPREHENSIVE METABOLIC PANEL
ALT: 14 U/L (ref 0–35)
AST: 14 U/L (ref 0–37)
Albumin: 3.9 g/dL (ref 3.5–5.2)
Alkaline Phosphatase: 65 U/L (ref 39–117)
BUN: 17 mg/dL (ref 6–23)
CO2: 23 mEq/L (ref 19–32)
Calcium: 9.2 mg/dL (ref 8.4–10.5)
Chloride: 108 mEq/L (ref 96–112)
Creatinine, Ser: 0.82 mg/dL (ref 0.40–1.20)
GFR: 69.75 mL/min (ref >60.00)
Glucose, Bld: 92 mg/dL (ref 70–99)
Potassium: 4.3 mEq/L (ref 3.5–5.1)
Sodium: 140 mEq/L (ref 135–145)
Total Bilirubin: 0.6 mg/dL (ref 0.2–1.2)
Total Protein: 6.5 g/dL (ref 6.0–8.3)

## 2020-09-27 LAB — TSH: TSH: 2.02 u[IU]/mL (ref 0.35–5.50)

## 2020-09-27 LAB — HEMOGLOBIN A1C: Hgb A1c MFr Bld: 5.9 % (ref 4.6–6.5)

## 2020-10-01 ENCOUNTER — Other Ambulatory Visit: Payer: Self-pay | Admitting: Family Medicine

## 2020-10-04 ENCOUNTER — Encounter: Payer: Medicare Other | Admitting: Family Medicine

## 2020-11-28 ENCOUNTER — Other Ambulatory Visit: Payer: Self-pay | Admitting: Family Medicine

## 2020-11-28 DIAGNOSIS — Z1231 Encounter for screening mammogram for malignant neoplasm of breast: Secondary | ICD-10-CM

## 2020-12-03 DIAGNOSIS — C4441 Basal cell carcinoma of skin of scalp and neck: Secondary | ICD-10-CM | POA: Diagnosis not present

## 2020-12-03 DIAGNOSIS — L821 Other seborrheic keratosis: Secondary | ICD-10-CM | POA: Diagnosis not present

## 2020-12-03 DIAGNOSIS — Z85828 Personal history of other malignant neoplasm of skin: Secondary | ICD-10-CM | POA: Diagnosis not present

## 2020-12-03 DIAGNOSIS — D2262 Melanocytic nevi of left upper limb, including shoulder: Secondary | ICD-10-CM | POA: Diagnosis not present

## 2020-12-03 DIAGNOSIS — L820 Inflamed seborrheic keratosis: Secondary | ICD-10-CM | POA: Diagnosis not present

## 2020-12-03 DIAGNOSIS — L720 Epidermal cyst: Secondary | ICD-10-CM | POA: Diagnosis not present

## 2020-12-03 DIAGNOSIS — D2272 Melanocytic nevi of left lower limb, including hip: Secondary | ICD-10-CM | POA: Diagnosis not present

## 2020-12-03 DIAGNOSIS — L738 Other specified follicular disorders: Secondary | ICD-10-CM | POA: Diagnosis not present

## 2020-12-03 HISTORY — PX: BASAL CELL CARCINOMA EXCISION: SHX1214

## 2021-01-04 ENCOUNTER — Other Ambulatory Visit: Payer: Self-pay | Admitting: Family Medicine

## 2021-01-04 ENCOUNTER — Ambulatory Visit
Admission: RE | Admit: 2021-01-04 | Discharge: 2021-01-04 | Disposition: A | Discharge status: Home / Self Care | Payer: Medicare Other | Source: Ambulatory Visit | Attending: Family Medicine | Admitting: Family Medicine

## 2021-01-04 DIAGNOSIS — Z1231 Encounter for screening mammogram for malignant neoplasm of breast: Secondary | ICD-10-CM | POA: Diagnosis not present

## 2021-01-10 ENCOUNTER — Telehealth: Payer: Self-pay

## 2021-01-10 NOTE — Telephone Encounter (Signed)
[facility] Night - Client Nonclinical Telephone Record  [facility] Client [facility] Night - Client Client Site [place] Provider Loura Pardon - MD Contact Type Call Who Is Calling Patient / Member / Family / Caregiver Caller Name Bunkie Phone Number [phone] Patient Name Gina Williams Patient DOB 1945-02-11 Call Type Message Only Information Provided Reason for Call Request to Reschedule Office Appointment Initial Comment Caller states she has a appt next Tuesday and wants to reschedule. Patient request to speak to RN No Additional Comment Office hours provided. Disp. Time Disposition Final User 01/09/2021 5:03:34 PM General Information Provided Yes Cathlean Sauer Call Closed By: Cathlean Sauer Transaction Date/Time: 01/09/2021 5:00:57 PM (ET

## 2021-01-10 NOTE — Telephone Encounter (Signed)
Called patient and Gina Williams

## 2021-01-15 ENCOUNTER — Encounter: Payer: Medicare Other | Admitting: Family Medicine

## 2021-01-29 ENCOUNTER — Telehealth: Payer: Self-pay | Admitting: Family Medicine

## 2021-01-29 NOTE — Telephone Encounter (Signed)
LVM for pt to tn my all to schedule AWV with [facility].

## 2021-02-26 ENCOUNTER — Other Ambulatory Visit: Payer: Self-pay

## 2021-03-04 NOTE — Progress Notes (Signed)
Subjective:   Gina Williams is a 76 y.o. female who presents for Medicare Annual (Subsequent) preventive examination.  I connected with Gina Williams today by telephone and verified that I am speaking with the correct person using two identifiers. Location patient: home Location provider: work Persons participating in the virtual visit: patient, Marine scientist.    I discussed the limitations, risks, security and privacy concerns of performing an evaluation and management service by telephone and the availability of in person appointments. I also discussed with the patient that there may be a patient responsible charge related to this service. The patient expressed understanding and verbally consented to this telephonic visit.    Interactive audio and video telecommunications were attempted between this provider and patient, however failed, due to patient having technical difficulties OR patient did not have access to video capability.  We continued and completed visit with audio only.  Some vital signs may be absent or patient reported.   Time Spent with patient on telephone encounter: 20 minutes  Review of Systems     Cardiac Risk Factors include: advanced age (>59men, >5 women);dyslipidemia     Objective:    Today's Vitals   03/05/21 0945  Weight: 139 lb (63 kg)  Height: 5\' 3"  (1.6 m)   Body mass index is 24.62 kg/m.  Advanced Directives 03/05/2021 06/22/2018 09/22/2017 09/22/2017 09/22/2017 09/14/2017 06/18/2017  Does Patient Have a Medical Advance Directive? Yes No Yes Yes Yes Yes Yes  Type of Paramedic of [place];Living will - Living will - Living will Living will [place];Living will  Does patient want to make changes to medical advance directive? Yes (MAU/Ambulatory/Procedural Areas - Information given) - No - Patient declined - - No - Patient declined -  Copy of [place] in Chart? - - - - - - No - copy  requested  Would patient like information on creating a medical advance directive? - No - Patient declined - - - - -    Current Medications (verified) Outpatient Encounter Medications as of 03/05/2021  Medication Sig   acetaminophen (TYLENOL) 500 MG tablet Take 500 mg by mouth every 6 (six) hours as needed for moderate pain or headache.    Cholecalciferol (VITAMIN D3 PO) Take 2,000 Units by mouth daily.    diphenhydrAMINE (BENADRYL) 25 mg capsule Take 25 mg by mouth at bedtime.   esomeprazole (NEXIUM) 40 MG capsule Take 1 capsule by mouth once daily   fluticasone (FLONASE) 50 MCG/ACT nasal spray USE 2 SPRAY(S) IN EACH NOSTRIL ONCE DAILY AS NEEDED FOR  ALLERGIES  OR  RHINITIS   Omega-3 Fatty Acids (FISH OIL PO) Take 1,900 mg by mouth daily.   Polyethyl Glycol-Propyl Glycol (SYSTANE) 0.4-0.3 % SOLN Apply to eye.   No facility-administered encounter medications on file as of 03/05/2021.    Allergies (verified) Amoxicillin-pot clavulanate, Omeprazole, and Other   History: Past Medical History:  Diagnosis Date   Arthritis    hands   Breast cancer ([place]) 09/16/2007   Mastectomy   Dyspnea    last 5 months, lack of conditioning   GERD (gastroesophageal reflux disease)    Hypoglycemic reaction    fainted in her youth , hadnt eaten anything for hours and was assisting with a 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 BMD   Personal history of chemotherapy  PONV (postoperative nausea and vomiting)    SEVERE, patient is very concerned about this issue occurred with mastectomy sugery ; no issues whn she had hysterectomy    Vertigo 2007   Past Surgical History:  Procedure Laterality Date   ANTERIOR AND POSTERIOR REPAIR N/A 09/22/2017   Procedure: CYSTOSCOPY ANTERIOR (CYSTOCELE);  Surgeon: Bjorn Loser, MD;  Location: [facility];  Service: Urology;  Laterality: N/A;   BASAL CELL CARCINOMA EXCISION N/A 12/03/2020   CATARACT  EXTRACTION, BILATERAL     COLONOSCOPY     CYSTOSCOPY N/A 10/23/2016   Procedure: CYSTOSCOPY;  Surgeon: Aloha Gell, MD;  Location: [facility];  Service: Gynecology;  Laterality: N/A;   GANGLION CYST EXCISION Left 1970s   Multiple surgeries   HAND SURGERY Left 10/2005   Nerve damage   HAND SURGERY Left 2011   Torn ligament surgery   MASTECTOMY  08/2007   Breast Cancer   Nuclear Stress Test     Negative  EF 77%   pap  08/05/2014   [facility]   pessary  12/19/2015   [facility] Dr. Pamala Hurry   ROBOTIC ASSISTED TOTAL HYSTERECTOMY WITH BILATERAL SALPINGO OOPHERECTOMY Bilateral 10/23/2016   Procedure: ROBOTIC ASSISTED TOTAL HYSTERECTOMY WITH BILATERAL SALPINGO OOPHORECTOMY/Uterosacral Ligament Suspension;  Surgeon: Aloha Gell, MD;  Location: [facility];  Service: Gynecology;  Laterality: Bilateral;   SHOULDER SURGERY Bilateral Late 1980s   Frozen shoulders bilaterally, no surgery, had physical therapy   Stress myoview  12/2003   Low risk   TONSILLECTOMY  Late 1970s   TUBAL LIGATION     VAGINAL PROLAPSE REPAIR N/A 09/22/2017   Procedure: VAGINAL VAULT SUSPENSION AND GRAFT;  Surgeon: Bjorn Loser, MD;  Location: [facility];  Service: Urology;  Laterality: N/A;   YAG LASER APPLICATION Bilateral 00/93/8182   Family History  Problem Relation Age of Onset   Asthma Mother    Aortic aneurysm Mother    Stroke Father    Alzheimer's disease Father    Cancer Paternal Aunt        Breast Cancer   Breast cancer Paternal Aunt    Cancer Paternal Aunt        Breast Cancer   Breast cancer Paternal Aunt    Cancer Paternal Aunt        Breast Cancer   Breast cancer Paternal Aunt    Stroke Maternal Uncle    Hypertension Other        HTN on Father's side of family   Social History   Socioeconomic History   Marital status: Single    Spouse name: Not on file   Number of children: Not on file   Years of education: Not on file   Highest education level: Not on file  Occupational  History   Not on file  Tobacco Use   Smoking status: Former    Types: Cigarettes    Quit date: 05/21/1998    Years since quitting: 22.8   Smokeless tobacco: Never  Vaping Use   Vaping Use: Never used  Substance and Sexual Activity   Alcohol use: No    Alcohol/week: 0.0 standard drinks   Drug use: No   Sexual activity: Never    Birth control/protection: Post-menopausal  Other Topics Concern   Not on file  Social History Narrative   Not on file   Social Determinants of Health   Financial Resource Strain: Low Risk    Difficulty of Paying Living Expenses: Not hard at all  Food Insecurity: No Food Insecurity  Worried About Charity fundraiser in the Last Year: Never true   [place] in the Last Year: Never true  Transportation Needs: No Transportation Needs   Lack of Transportation (Medical): No   Lack of Transportation (Non-Medical): No  Physical Activity: Insufficiently Active   Days of Exercise per Week: 7 days   Minutes of Exercise per Session: 20 min  Stress: No Stress Concern Present   Feeling of Stress : Only a little  Social Connections: Socially Isolated   Frequency of Communication with Friends and Family: More than three times a week   Frequency of Social Gatherings with Friends and Family: More than three times a week   Attends Religious Services: Never   Marine scientist or Organizations: No   Attends Music therapist: Never   Marital Status: Never married    Tobacco Counseling Counseling given: Not Answered   Clinical Intake:  Pre-visit preparation completed: Yes  Pain : No/denies pain     BMI - recorded: 24.62 Nutritional Status: BMI of 19-24  Normal Nutritional Risks: None Diabetes: No  How often do you need to have someone help you when you read instructions, pamphlets, or other written materials from your doctor or pharmacy?: 1 - Never  Diabetic? No  Interpreter Needed?: No  Information entered by :: Orrin Brigham LPN   Activities of Daily Living In your present state of health, do you have any difficulty performing the following activities: 03/05/2021  Hearing? N  Vision? N  Difficulty concentrating or making decisions? N  Walking or climbing stairs? N  Dressing or bathing? N  Doing errands, shopping? N  Preparing Food and eating ? N  Using the Toilet? N  In the past six months, have you accidently leaked urine? Y  Do you have problems with loss of bowel control? N  Managing your Medications? N  Managing your Finances? N  Housekeeping or managing your Housekeeping? N  Some recent data might be hidden    Patient Care Team: Tower, Wynelle Fanny, MD as PCP - General Marygrace Drought, MD as Consulting Physician (Ophthalmology) Aloha Gell, MD as Consulting Physician (Obstetrics and Gynecology) Eula Listen, DDS as Referring Physician (Dentistry)  Indicate any recent Medical Services you may have received from other than [facility] providers in the past year (date may be approximate).     Assessment:   This is a routine wellness examination for [place].  Hearing/Vision screen Hearing Screening - Comments:: No issues , at times difficult hearing with multiple sounds at the same time Vision Screening - Comments:: Last exam 06/04/20, Dr. Satira Sark , wears glasses   Dietary issues and exercise activities discussed: Current Exercise Habits: Home exercise routine, Type of exercise: stretching, Time (Minutes): 20, Frequency (Times/Week): 7, Weekly Exercise (Minutes/Week): 140, Intensity: Mild   Goals Addressed             This Visit's Progress    Patient Stated       Would like to maintain current routine.       Depression Screen PHQ 2/9 Scores 03/05/2021 06/29/2019 06/22/2018 06/18/2017 12/26/2015 12/01/2014 11/28/2013  PHQ - 2 Score 0 0 0 0 0 0 0  PHQ- 9 Score - - 0 0 - - -    Fall Risk Fall Risk  03/05/2021 06/29/2019 06/22/2018 06/18/2017 12/26/2015  Falls in the past year? 0 0 0 No No  Number  falls in past yr: 0 - - - -  Injury with Fall? 0 - - - -  Risk for fall due to : No Fall Risks - - - -  Follow up Falls prevention discussed - - - -    FALL RISK PREVENTION PERTAINING TO THE HOME:  Any stairs in or around the home? Yes  If so, are there any without handrails? No  Home free of loose throw rugs in walkways, pet beds, electrical cords, etc? Yes  Adequate lighting in your home to reduce risk of falls? Yes   ASSISTIVE DEVICES UTILIZED TO PREVENT FALLS:  Life alert? No  Use of a cane, walker or w/c? No  Grab bars in the bathroom? Yes  Shower chair or bench in shower? Yes  Elevated toilet seat or a handicapped toilet? Yes   TIMED UP AND GO:  Was the test performed? No .    Cognitive Function: Normal cognitive status assessed by  this Nurse Health Advisor. No abnormalities found.   MMSE - Mini Mental State Exam 06/22/2018 06/18/2017 12/26/2015  Orientation to time 5 5 5   Orientation to Place 5 5 5   Registration 3 3 3   Attention/ Calculation 0 0 0  Recall 3 3 3   Language- name 2 objects 0 0 0  Language- repeat 1 1 1   Language- follow 3 step command 0 3 3  Language- read & follow direction 0 0 0  Write a sentence 0 0 0  Copy design 0 0 0  Total score 17 20 20         Immunizations Immunization History  Administered Date(s) Administered   Influenza,inj,Quad PF,6+ Mos 11/18/2012   PFIZER(Purple Top)SARS-COV-2 Vaccination 03/18/2019, 04/12/2019, 01/17/2020   Td 06/29/2012    TDAP status: Up to date  Flu Vaccine status: Declined, Education has been provided regarding the importance of this vaccine but patient still declined. Advised may receive this vaccine at local pharmacy or Health Dept. Aware to provide a copy of the vaccination record if obtained from local pharmacy or Health Dept. Verbalized acceptance and understanding.  Pneumococcal vaccine status: Up to date  Covid-19 vaccine status: Declined, Education has been provided regarding the importance of  this vaccine but patient still declined. Advised may receive this vaccine at local pharmacy or Health Dept.or vaccine clinic. Aware to provide a copy of the vaccination record if obtained from local pharmacy or Health Dept. Verbalized acceptance and understanding.  Qualifies for Shingles Vaccine? Yes   Zostavax completed No   Shingrix Completed?: No.    Education has been provided regarding the importance of this vaccine. Patient has been advised to call insurance company to determine out of pocket expense if they have not yet received this vaccine. Advised may also receive vaccine at local pharmacy or Health Dept. Verbalized acceptance and understanding.  Screening Tests Health Maintenance  Topic Date Due   Zoster Vaccines- Shingrix (1 of 2) Never done   Pneumonia Vaccine 44+ Years old (1 - PCV) Never done   COVID-19 Vaccine (4 - Booster for Pfizer series) 03/13/2020   INFLUENZA VACCINE  08/20/2020   MAMMOGRAM  01/04/2022   TETANUS/TDAP  06/30/2022   DEXA SCAN  Completed   Hepatitis C Screening  Completed   HPV VACCINES  Aged Out   COLONOSCOPY (Pts 45-52yrs Insurance coverage will need to be confirmed)  Discontinued    Health Maintenance  Health Maintenance Due  Topic Date Due   Zoster Vaccines- Shingrix (1 of 2) Never done   Pneumonia Vaccine 49+ Years old (1 - PCV) Never done   COVID-19 Vaccine (4 - Booster for [employer]  series) 03/13/2020   INFLUENZA VACCINE  08/20/2020    Colorectal cancer screening: No longer required.   Mammogram status: Completed 01/04/21. Repeat every year  Bone Density status: Patient plans on discussing with PCP when decided   Lung Cancer Screening: (Low Dose CT Chest recommended if Age 64-80 years, 30 pack-year currently smoking OR have quit w/in 15years.) does not qualify.     Additional Screening:  Hepatitis C Screening: does qualify; Completed 12/26/15  Vision Screening: Recommended annual ophthalmology exams for early detection of glaucoma and  other disorders of the eye. Is the patient up to date with their annual eye exam?  Yes  Who is the provider or what is the name of the office in which the patient attends annual eye exams? Dr. Satira Sark   Dental Screening: Recommended annual dental exams for proper oral hygiene  Community Resource Referral / Chronic Care Management: CRR required this visit?  No   CCM required this visit?  No      Plan:     I have personally reviewed and noted the following in the patients chart:   Medical and social history Use of alcohol, tobacco or illicit drugs  Current medications and supplements including opioid prescriptions.  Functional ability and status Nutritional status Physical activity Advanced directives List of other physicians Hospitalizations, surgeries, and ER visits in previous 12 months Vitals Screenings to include cognitive, depression, and falls Referrals and appointments  In addition, I have reviewed and discussed with patient certain preventive protocols, quality metrics, and best practice recommendations. A written personalized care plan for preventive services as well as general preventive health recommendations were provided to patient.   Due to this being a telephonic visit, the after visit summary with patients personalized plan was offered to patient via mail or my-chart.  Patient preferred to pick up at office at next visit.   Loma Messing, LPN   04/02/9700   Nurse Health Advisor  Nurse Notes: none

## 2021-03-05 ENCOUNTER — Ambulatory Visit (INDEPENDENT_AMBULATORY_CARE_PROVIDER_SITE_OTHER): Payer: Medicare Other

## 2021-03-05 ENCOUNTER — Telehealth: Payer: Self-pay | Admitting: *Deleted

## 2021-03-05 VITALS — Ht 63.0 in | Wt 139.0 lb

## 2021-03-05 DIAGNOSIS — Z0000 Encounter for general adult medical examination without abnormal findings: Secondary | ICD-10-CM

## 2021-03-05 NOTE — Telephone Encounter (Signed)
(  Sending message for new AWV Nurse).  Pt had her AWV appt today and asked if she needs lab work. Pt had lab work last on 09/27/20, and f/u with PCP on 03/27/21 but no lab appt. Pt questions if she needs to come in prior for her labs, please advise

## 2021-03-05 NOTE — Patient Instructions (Signed)
Gina Williams , Thank you for taking time to complete your Medicare Wellness Visit. I appreciate your ongoing commitment to your health goals. Please review the following plan we discussed and let me know if I can assist you in the future.   Screening recommendations/referrals: Colonoscopy: no longer required  Mammogram: up to date, completed 01/04/21, due 01/04/22 Bone Density: due, last completed 02/02/15 Recommended yearly ophthalmology/optometry visit for glaucoma screening and checkup Recommended yearly dental visit for hygiene and checkup  Vaccinations: Influenza vaccine:May obtain vaccine at our office or your local pharmacy. Pneumococcal vaccine: May obtain vaccine at our office or your local pharmacy. Tdap vaccine: up to date, completed 06/29/12, due 06/30/22 Shingles vaccine: May obtain vaccine at your local pharmacy.   Covid-19:newest booster available at your local pharmacy   Advanced directives: copy on file   Conditions/risks identified: see problem list   Next appointment: Follow up in one year for your annual wellness visit 03/06/22 @ 9:45am, this will be a telephone visit   Preventive Care 6 Years and Older, Female Preventive care refers to lifestyle choices and visits with your health care provider that can promote health and wellness. What does preventive care include? A yearly physical exam. This is also called an annual well check. Dental exams once or twice a year. Routine eye exams. Ask your health care provider how often you should have your eyes checked. Personal lifestyle choices, including: Daily care of your teeth and gums. Regular physical activity. Eating a healthy diet. Avoiding tobacco and drug use. Limiting alcohol use. Practicing safe sex. Taking low-dose aspirin every day. Taking vitamin and mineral supplements as recommended by your health care provider. What happens during an annual well check? The services and screenings done by your health  care provider during your annual well check will depend on your age, overall health, lifestyle risk factors, and family history of disease. Counseling  Your health care provider may ask you questions about your: Alcohol use. Tobacco use. Drug use. Emotional well-being. Home and relationship well-being. Sexual activity. Eating habits. History of falls. Memory and ability to understand (cognition). Work and work Statistician. Reproductive health. Screening  You may have the following tests or measurements: Height, weight, and BMI. Blood pressure. Lipid and cholesterol levels. These may be checked every 5 years, or more frequently if you are over 39 years old. Skin check. Lung cancer screening. You may have this screening every year starting at age 66 if you have a 30-pack-year history of smoking and currently smoke or have quit within the past 15 years. Fecal occult blood test (FOBT) of the stool. You may have this test every year starting at age 3. Flexible sigmoidoscopy or colonoscopy. You may have a sigmoidoscopy every 5 years or a colonoscopy every 10 years starting at age 52. Hepatitis C blood test. Hepatitis B blood test. Sexually transmitted disease (STD) testing. Diabetes screening. This is done by checking your blood sugar (glucose) after you have not eaten for a while (fasting). You may have this done every 1-3 years. Bone density scan. This is done to screen for osteoporosis. You may have this done starting at age 54. Mammogram. This may be done every 1-2 years. Talk to your health care provider about how often you should have regular mammograms. Talk with your health care provider about your test results, treatment options, and if necessary, the need for more tests. Vaccines  Your health care provider may recommend certain vaccines, such as: Influenza vaccine. This is recommended every year.  Tetanus, diphtheria, and acellular pertussis (Tdap, Td) vaccine. You may need a Td  booster every 10 years. Zoster vaccine. You may need this after age 97. Pneumococcal 13-valent conjugate (PCV13) vaccine. One dose is recommended after age 14. Pneumococcal polysaccharide (PPSV23) vaccine. One dose is recommended after age 78. Talk to your health care provider about which screenings and vaccines you need and how often you need them. This information is not intended to replace advice given to you by your health care provider. Make sure you discuss any questions you have with your health care provider. Document Released: 02/02/2015 Document Revised: 09/26/2015 Document Reviewed: 11/07/2014 Elsevier Interactive Patient Education  2017 [place] Prevention in the Home Falls can cause injuries. They can happen to people of all ages. There are many things you can do to make your home safe and to help prevent falls. What can I do on the outside of my home? Regularly fix the edges of walkways and driveways and fix any cracks. Remove anything that might make you trip as you walk through a door, such as a raised step or threshold. Trim any bushes or trees on the path to your home. Use bright outdoor lighting. Clear any walking paths of anything that might make someone trip, such as rocks or tools. Regularly check to see if handrails are loose or broken. Make sure that both sides of any steps have handrails. Any raised decks and porches should have guardrails on the edges. Have any leaves, snow, or ice cleared regularly. Use sand or salt on walking paths during winter. Clean up any spills in your garage right away. This includes oil or grease spills. What can I do in the bathroom? Use night lights. Install grab bars by the toilet and in the tub and shower. Do not use towel bars as grab bars. Use non-skid mats or decals in the tub or shower. If you need to sit down in the shower, use a plastic, non-slip stool. Keep the floor dry. Clean up any water that spills on the floor  as soon as it happens. Remove soap buildup in the tub or shower regularly. Attach bath mats securely with double-sided non-slip rug tape. Do not have throw rugs and other things on the floor that can make you trip. What can I do in the bedroom? Use night lights. Make sure that you have a light by your bed that is easy to reach. Do not use any sheets or blankets that are too big for your bed. They should not hang down onto the floor. Have a firm chair that has side arms. You can use this for support while you get dressed. Do not have throw rugs and other things on the floor that can make you trip. What can I do in the kitchen? Clean up any spills right away. Avoid walking on wet floors. Keep items that you use a lot in easy-to-reach places. If you need to reach something above you, use a strong step stool that has a grab bar. Keep electrical cords out of the way. Do not use floor polish or wax that makes floors slippery. If you must use wax, use non-skid floor wax. Do not have throw rugs and other things on the floor that can make you trip. What can I do with my stairs? Do not leave any items on the stairs. Make sure that there are handrails on both sides of the stairs and use them. Fix handrails that are broken or loose.  Make sure that handrails are as long as the stairways. Check any carpeting to make sure that it is firmly attached to the stairs. Fix any carpet that is loose or worn. Avoid having throw rugs at the top or bottom of the stairs. If you do have throw rugs, attach them to the floor with carpet tape. Make sure that you have a light switch at the top of the stairs and the bottom of the stairs. If you do not have them, ask someone to add them for you. What else can I do to help prevent falls? Wear shoes that: Do not have high heels. Have rubber bottoms. Are comfortable and fit you well. Are closed at the toe. Do not wear sandals. If you use a stepladder: Make sure that it is  fully opened. Do not climb a closed stepladder. Make sure that both sides of the stepladder are locked into place. Ask someone to hold it for you, if possible. Clearly mark and make sure that you can see: Any grab bars or handrails. First and last steps. Where the edge of each step is. Use tools that help you move around (mobility aids) if they are needed. These include: Canes. Walkers. Scooters. Crutches. Turn on the lights when you go into a dark area. Replace any light bulbs as soon as they burn out. Set up your furniture so you have a clear path. Avoid moving your furniture around. If any of your floors are uneven, fix them. If there are any pets around you, be aware of where they are. Review your medicines with your doctor. Some medicines can make you feel dizzy. This can increase your chance of falling. Ask your doctor what other things that you can do to help prevent falls. This information is not intended to replace advice given to you by your health care provider. Make sure you discuss any questions you have with your health care provider. Document Released: 11/02/2008 Document Revised: 06/14/2015 Document Reviewed: 02/10/2014 Elsevier Interactive Patient Education  2017 [employer].

## 2021-03-05 NOTE — Telephone Encounter (Signed)
**  message was sent in Error to pt's whole "care team" message should have only been sent to Dr. Glori Bickers. All other providers please disregard this message.   Dr. Glori Bickers please see prev message.

## 2021-03-05 NOTE — Telephone Encounter (Signed)
Please schedule fasting labs before the appt in march

## 2021-03-11 ENCOUNTER — Telehealth: Payer: Self-pay | Admitting: *Deleted

## 2021-03-11 NOTE — Telephone Encounter (Signed)
PLEASE NOTE: All timestamps contained within this report are represented as [place] Standard Time. CONFIDENTIALTY NOTICE: This fax transmission is intended only for the addressee. It contains information that is legally privileged, confidential or otherwise protected from use or disclosure. If you are not the intended recipient, you are strictly prohibited from reviewing, disclosing, copying using or disseminating any of this information or taking any action in reliance on or regarding this information. If you have received this fax in error, please notify us immediately by telephone so that we can arrange for its return to [place]. Phone: [phone], Toll-Free: [phone], Fax: [phone] Page: 1 of 2 Call Id: [id number] [facility] - Client TELEPHONE ADVICE RECORD [facility] Patient Name: Gina Williams Gender: Female DOB: January 13, 1946 Age: 76 Y 3 M 26 D Return Phone Number: [phone] (Primary), [phone] (Secondary) Address: City/ State/ Zip: [place] [address] Client [facility] - Client Client Site [place] Provider Glori Bickers, Roque Lias - MD Contact Type Call Who Is Calling Patient / Member / Family / Caregiver Call Type Triage / Clinical Relationship To Patient Self Return Phone Number [phone] (Primary) Chief Complaint CHEST PAIN - pain, pressure, heaviness or tightness Reason for Call Symptomatic / Request for [place] states she is having chest pain. Translation No Nurse Assessment Nurse: Self, RN, Nira Conn Date/Time (Eastern Time): 03/11/2021 8:29:46 AM Confirm and document reason for call. If symptomatic, describe symptoms. ---Caller says CP was over the weekend , caller thinks its reflux and wanted to make an appointment. Does the patient have any new or worsening symptoms? ---Yes Will a triage be completed? ---Yes Related visit to physician  within the last 2 weeks? ---No Does the PT have any chronic conditions? (i.e. diabetes, asthma, this includes High risk factors for pregnancy, etc.) ---Yes List chronic conditions. ---Acid Reflux Is this a behavioral health or substance abuse call? ---No Guidelines Guideline Title Affirmed Question Affirmed Notes Nurse Date/Time (Eastern Time) Chest Pain [1] Chest pain lasts > 5 minutes AND [2] occurred in past 3 days (72 hours) (Exception: feels exactly the same as previously diagnosed heartburn and has accompanying sour taste in mouth) Self, RN, Nira Conn 03/11/2021 8:31:04 AM PLEASE NOTE: All timestamps contained within this report are represented as [place] Standard Time. CONFIDENTIALTY NOTICE: This fax transmission is intended only for the addressee. It contains information that is legally privileged, confidential or otherwise protected from use or disclosure. If you are not the intended recipient, you are strictly prohibited from reviewing, disclosing, copying using or disseminating any of this information or taking any action in reliance on or regarding this information. If you have received this fax in error, please notify us immediately by telephone so that we can arrange for its return to [place]. Phone: [phone], Toll-Free: [phone], Fax: [phone] Page: 2 of 2 Call Id: [id number] [place]. Time Eilene Ghazi Time) Disposition Final User 03/11/2021 8:28:01 AM Send to Urgent Queue Baker Janus 03/11/2021 8:40:57 AM Go to ED Now (or PCP triage) Yes Self, RN, Soyla Murphy Disagree/Comply Comply Caller Understands Yes PreDisposition Call Doctor Care Advice Given Per Guideline GO TO ED NOW (OR PCP TRIAGE): CARE ADVICE given per Chest Pain (Adult) guideline. * You become worse CALL EMS IF: Referrals GO TO FACILITY UNDECIDED

## 2021-03-11 NOTE — Telephone Encounter (Signed)
Spoke to patient by telephone and was advised that she decided not to go to the ER. Patient stated that she is feeling just fine now. Patient denies chest  pain, SOB or any other symptoms at this time. Patient stated that she felt really bad Friday night but feels that it was acid reflux. Patient stated that she laid down right after eating and the symptoms started at that time. Patient stated that she was having some left shoulder pain at that time. Patient was given ER precautions and she verbalized understanding. Patient stated if her symptoms return she will go to the ER or call 911.  Patient stated that she has an upcoming appointment scheduled with Dr. Glori Bickers 03/27/21 but will come in sooner if she needs to.

## 2021-03-12 NOTE — Telephone Encounter (Signed)
Aware ?Agree with ER precautions  ?

## 2021-03-13 NOTE — Telephone Encounter (Signed)
Pt has been scheduled.  °

## 2021-03-15 ENCOUNTER — Telehealth: Payer: Self-pay | Admitting: Family Medicine

## 2021-03-15 DIAGNOSIS — R7309 Other abnormal glucose: Secondary | ICD-10-CM

## 2021-03-15 DIAGNOSIS — R5382 Chronic fatigue, unspecified: Secondary | ICD-10-CM

## 2021-03-15 DIAGNOSIS — E7800 Pure hypercholesterolemia, unspecified: Secondary | ICD-10-CM

## 2021-03-15 NOTE — Telephone Encounter (Signed)
Appt on 03/19/21 is for a labs and the note says it's for pt's f/u appt with you on 03/27/21

## 2021-03-15 NOTE — Telephone Encounter (Signed)
That is fine, is she coming for a physical?

## 2021-03-15 NOTE — Telephone Encounter (Signed)
Mrs. Aalaysia called in and wanted to know if a tyroid lab can be done due to she has no energy and she is coming ion 2/28

## 2021-03-18 NOTE — Addendum Note (Signed)
Addended by: Loura Pardon A on: 03/18/2021 09:00 PM   Modules accepted: Orders

## 2021-03-19 ENCOUNTER — Other Ambulatory Visit (INDEPENDENT_AMBULATORY_CARE_PROVIDER_SITE_OTHER): Payer: Medicare Other

## 2021-03-19 ENCOUNTER — Other Ambulatory Visit: Payer: Self-pay

## 2021-03-19 DIAGNOSIS — R7309 Other abnormal glucose: Secondary | ICD-10-CM | POA: Diagnosis not present

## 2021-03-19 DIAGNOSIS — E7800 Pure hypercholesterolemia, unspecified: Secondary | ICD-10-CM | POA: Diagnosis not present

## 2021-03-19 DIAGNOSIS — R5382 Chronic fatigue, unspecified: Secondary | ICD-10-CM | POA: Diagnosis not present

## 2021-03-19 LAB — CBC WITH DIFFERENTIAL/PLATELET
Basophils Absolute: 0.1 10*3/uL (ref 0.0–0.1)
Basophils Relative: 0.7 % (ref 0.0–3.0)
Eosinophils Absolute: 0.1 10*3/uL (ref 0.0–0.7)
Eosinophils Relative: 1.6 % (ref 0.0–5.0)
HCT: 40.1 % (ref 36.0–46.0)
Hemoglobin: 13.4 g/dL (ref 12.0–15.0)
Lymphocytes Relative: 29.3 % (ref 12.0–46.0)
Lymphs Abs: 2.6 10*3/uL (ref 0.7–4.0)
MCHC: 33.3 g/dL (ref 30.0–36.0)
MCV: 87.2 fl (ref 78.0–100.0)
Monocytes Absolute: 0.7 10*3/uL (ref 0.1–1.0)
Monocytes Relative: 7.8 % (ref 3.0–12.0)
Neutro Abs: 5.4 10*3/uL (ref 1.4–7.7)
Neutrophils Relative %: 60.6 % (ref 43.0–77.0)
Platelets: 301 10*3/uL (ref 150.0–400.0)
RBC: 4.6 Mil/uL (ref 3.87–5.11)
RDW: 13.2 % (ref 11.5–15.5)
WBC: 8.9 10*3/uL (ref 4.0–10.5)

## 2021-03-19 LAB — COMPREHENSIVE METABOLIC PANEL
ALT: 12 U/L (ref 0–35)
AST: 14 U/L (ref 0–37)
Albumin: 4.1 g/dL (ref 3.5–5.2)
Alkaline Phosphatase: 64 U/L (ref 39–117)
BUN: 20 mg/dL (ref 6–23)
CO2: 23 mEq/L (ref 19–32)
Calcium: 9.1 mg/dL (ref 8.4–10.5)
Chloride: 106 mEq/L (ref 96–112)
Creatinine, Ser: 0.88 mg/dL (ref 0.40–1.20)
GFR: 63.87 mL/min (ref >60.00)
Glucose, Bld: 81 mg/dL (ref 70–99)
Potassium: 4.1 mEq/L (ref 3.5–5.1)
Sodium: 140 mEq/L (ref 135–145)
Total Bilirubin: 0.4 mg/dL (ref 0.2–1.2)
Total Protein: 6.6 g/dL (ref 6.0–8.3)

## 2021-03-19 LAB — HEMOGLOBIN A1C: Hgb A1c MFr Bld: 5.9 % (ref 4.6–6.5)

## 2021-03-19 LAB — TSH: TSH: 1.13 u[IU]/mL (ref 0.35–5.50)

## 2021-03-19 LAB — LIPID PANEL
Cholesterol: 171 mg/dL (ref 0–200)
HDL: 44.2 mg/dL (ref >39.00)
LDL Cholesterol: 101 mg/dL — ABNORMAL HIGH (ref 0–99)
NonHDL: 126.85
Total CHOL/HDL Ratio: 4
Triglycerides: 128 mg/dL (ref 0.0–149.0)
VLDL: 25.6 mg/dL (ref 0.0–40.0)

## 2021-03-27 ENCOUNTER — Ambulatory Visit (INDEPENDENT_AMBULATORY_CARE_PROVIDER_SITE_OTHER): Payer: Medicare Other | Admitting: Family Medicine

## 2021-03-27 ENCOUNTER — Other Ambulatory Visit: Payer: Self-pay

## 2021-03-27 ENCOUNTER — Encounter: Payer: Self-pay | Admitting: Family Medicine

## 2021-03-27 VITALS — BP 122/68 | HR 83 | Temp 97.3°F | Ht 63.0 in | Wt 137.0 lb

## 2021-03-27 DIAGNOSIS — I700 Atherosclerosis of aorta: Secondary | ICD-10-CM

## 2021-03-27 DIAGNOSIS — M8589 Other specified disorders of bone density and structure, multiple sites: Secondary | ICD-10-CM

## 2021-03-27 DIAGNOSIS — R7309 Other abnormal glucose: Secondary | ICD-10-CM

## 2021-03-27 DIAGNOSIS — K219 Gastro-esophageal reflux disease without esophagitis: Secondary | ICD-10-CM | POA: Diagnosis not present

## 2021-03-27 DIAGNOSIS — E7800 Pure hypercholesterolemia, unspecified: Secondary | ICD-10-CM | POA: Diagnosis not present

## 2021-03-27 DIAGNOSIS — Z1211 Encounter for screening for malignant neoplasm of colon: Secondary | ICD-10-CM

## 2021-03-27 DIAGNOSIS — R5382 Chronic fatigue, unspecified: Secondary | ICD-10-CM | POA: Diagnosis not present

## 2021-03-27 MED ORDER — ESOMEPRAZOLE MAGNESIUM 40 MG PO CPDR: 40.0000 mg | DELAYED_RELEASE_CAPSULE | Freq: Every day | ORAL | 3 refills | Status: DC

## 2021-03-27 NOTE — Assessment & Plan Note (Signed)
dexa 2017 ?Declines another  ?No falls ro fractures  ?Taking vit D ?Encouraged wt bearing exercise  ?

## 2021-03-27 NOTE — Assessment & Plan Note (Addendum)
n

## 2021-03-27 NOTE — Progress Notes (Signed)
? ?Subjective:  ? ? Patient ID: Gina Williams, female    DOB: 01-Aug-1945, 76 y.o.   MRN: [id number] ? ?This visit occurred during the SARS-CoV-2 public health emergency.  Safety protocols were in place, including screening questions prior to the visit, additional usage of staff PPE, and extensive cleaning of exam room while observing appropriate contact time as indicated for disinfecting solutions.  ? ?HPI ?Pt presents for f/u of chronic health problems  ? ?Wt Readings from Last 3 Encounters:  ?03/27/21 137 lb (62.1 kg)  ?03/05/21 139 lb (63 kg)  ?06/29/19 139 lb 5 oz (63.2 kg)  ? ?24.27 kg/m? ? ?Takes care of people  (mother especially)  ?2 brothers who are handicapped  ?Not as much herself  ?Some fatigue  ? ?Not really exercising  ?Stretches in the am and walking in place  ?On feet and active  ?No time and cannot leave her mom   (had to have niece come and stay with her to go shopping)  ? ? ?She declines immunizations : ?Zoster ?Pna  ?Flu  ? ?Has had some covid imms  ? ?She had amw on 2/14- reviewed  ? ?Mammogram 12/2020 ?Self breast exam: no changes  ?Personal h/o breast cancer  ? ?Colon cancer screening  ?Had colonoscopy 2015 ?Declines cologuard or ifob  ? ? ?Osteopenia ?Dexa 01/2015 , declines another  ?Falls: none ?Fractures: none  ?Supplements : vitamin D  ?Exercise : on feet all day and no extra ? ?Had derm f/u for basal cell skin cancer  ?Not outdoors much/has sunscreen  ? ?BP Readings from Last 3 Encounters:  ?03/27/21 122/68  ?06/29/19 122/70  ?06/29/18 107/68  ? ?Pulse Readings from Last 3 Encounters:  ?03/27/21 83  ?06/29/19 80  ?06/29/18 89  ? ?Fatigue ?Lab Results  ?Component Value Date  ? TSH 1.13 03/19/2021  ?  ?Lab Results  ?Component Value Date  ? WBC 8.9 03/19/2021  ? HGB 13.4 03/19/2021  ? HCT 40.1 03/19/2021  ? MCV 87.2 03/19/2021  ? PLT 301.0 03/19/2021  ? ? ? ?GERD ?Takes nexium 40 mg and has not been able to wean  ?Had a bad flare for 5 d and vomited once and some pain in chest (she  declined er Medical illustrator) ?Usually wears medi alert necklace  ?Better now  ? ? ?Lab Results  ?Component Value Date  ? CREATININE 0.88 03/19/2021  ? BUN 20 03/19/2021  ? NA 140 03/19/2021  ? K 4.1 03/19/2021  ? CL 106 03/19/2021  ? CO2 23 03/19/2021  ? ?Lab Results  ?Component Value Date  ? ALT 12 03/19/2021  ? AST 14 03/19/2021  ? ALKPHOS 64 03/19/2021  ? BILITOT 0.4 03/19/2021  ? ? ?Elevated glucose level ?Lab Results  ?Component Value Date  ? HGBA1C 5.9 03/19/2021  ? ?Hyperlipidemia ?Lab Results  ?Component Value Date  ? CHOL 171 03/19/2021  ? CHOL 191 09/27/2020  ? CHOL 193 06/22/2019  ? ?Lab Results  ?Component Value Date  ? HDL 44.20 03/19/2021  ? HDL 47.50 09/27/2020  ? HDL 49.90 06/22/2019  ? ?Lab Results  ?Component Value Date  ? LDLCALC 101 (H) 03/19/2021  ? LDLCALC 122 (H) 09/27/2020  ? LDLCALC 119 (H) 06/22/2019  ? ?Lab Results  ?Component Value Date  ? TRIG 128.0 03/19/2021  ? TRIG 105.0 09/27/2020  ? TRIG 118.0 06/22/2019  ? ?Lab Results  ?Component Value Date  ? CHOLHDL 4 03/19/2021  ? CHOLHDL 4 09/27/2020  ? CHOLHDL 4 06/22/2019  ? ?  Lab Results  ?Component Value Date  ? LDLDIRECT 156.2 10/27/2012  ? ?LDL is improved ?Unsure if she changed diet that much  ?Avoids fatty meat as a rule and no fried food  ? ? ? ? ?Review of Systems ? ?   ?Objective:  ? Physical Exam ?Constitutional:   ?   General: She is not in acute distress. ?   Appearance: Normal appearance. She is well-developed. She is not ill-appearing or diaphoretic.  ?HENT:  ?   Head: Normocephalic and atraumatic.  ?   Right Ear: Tympanic membrane, ear canal and external ear normal.  ?   Left Ear: Tympanic membrane, ear canal and external ear normal.  ?   Nose: Nose normal. No congestion.  ?   Mouth/Throat:  ?   Mouth: Mucous membranes are moist.  ?   Pharynx: Oropharynx is clear. No posterior oropharyngeal erythema.  ?Eyes:  ?   General: No scleral icterus. ?   Extraocular Movements: Extraocular movements intact.  ?   Conjunctiva/sclera:  Conjunctivae normal.  ?   Pupils: Pupils are equal, round, and reactive to light.  ?Neck:  ?   Thyroid: No thyromegaly.  ?   Vascular: No carotid bruit or JVD.  ?Cardiovascular:  ?   Rate and Rhythm: Normal rate and regular rhythm.  ?   Pulses: Normal pulses.  ?   Heart sounds: Normal heart sounds.  ?  No gallop.  ?Pulmonary:  ?   Effort: Pulmonary effort is normal. No respiratory distress.  ?   Breath sounds: Normal breath sounds. No wheezing.  ?   Comments: Good air exch ?Chest:  ?   Chest wall: No tenderness.  ?Abdominal:  ?   General: Bowel sounds are normal. There is no distension or abdominal bruit.  ?   Palpations: Abdomen is soft. There is no mass.  ?   Tenderness: There is no abdominal tenderness.  ?   Hernia: No hernia is present.  ?Genitourinary: ?   Comments: Breast examL:  No mass, nodules, thickening, tenderness, bulging, retraction, inflamation, nipple discharge or skin changes noted.  No axillary or clavicular LA.     ? ?R mastectomy site is normal ?Musculoskeletal:     ?   General: No tenderness. Normal range of motion.  ?   Cervical back: Normal range of motion and neck supple. No rigidity. No muscular tenderness.  ?   Right lower leg: No edema.  ?   Left lower leg: No edema.  ?Lymphadenopathy:  ?   Cervical: No cervical adenopathy.  ?Skin: ?   General: Skin is warm and dry.  ?   Coloration: Skin is not pale.  ?   Findings: No erythema or rash.  ?   Comments: Solar lentigines diffusely ?Some sks  ?Neurological:  ?   Mental Status: She is alert. Mental status is at baseline.  ?   Cranial Nerves: No cranial nerve deficit.  ?   Motor: No abnormal muscle tone.  ?   Coordination: Coordination normal.  ?   Gait: Gait normal.  ?   Deep Tendon Reflexes: Reflexes are normal and symmetric. Reflexes normal.  ?Psychiatric:     ?   Mood and Affect: Mood normal.     ?   Cognition and Memory: Cognition and memory normal.  ? ? ? ? ? ?   ?Assessment & Plan:  ? ?Problem List Items Addressed This Visit   ? ?  ?  Cardiovascular and Mediastinum  ? Atherosclerosis of  aorta ([place])  ?  bp and cholesterol are controlled  ?  ?  ?  ? Digestive  ? GERD - Primary  ?  takex nexium 40 mg  ?One recent flare resolved  ?Has been unable to wean ?  ?  ? Relevant Medications  ? esomeprazole (NEXIUM) 40 MG capsule  ?  ? Musculoskeletal and Integument  ? Osteopenia  ?  dexa 2017 ?Declines another  ?No falls ro fractures  ?Taking vit D ?Encouraged wt bearing exercise  ?  ?  ?  ? Other  ? Colon cancer screening  ?  Colonoscopy 2015 ?Declines cologaurd or ifob at this time  ?No bowel changes  ?  ?  ? Elevated glucose level  ?  Lab Results  ?Component Value Date  ? HGBA1C 5.9 03/19/2021  ?disc imp of low glycemic diet and wt loss to prevent DM2  ?  ?  ? Fatigue  ?  Suspect due to schedule  ?Labs reviewed ?Encouraged self care  ?  ?  ? Hyperlipidemia  ?  Disc goals for lipids and reasons to control them ?Rev last labs with pt ?Rev low sat fat diet in detail ?LDL down to 101 ?Encouraged healthy diet  ?  ?  ? ? ? ? ?

## 2021-03-27 NOTE — Assessment & Plan Note (Signed)
bp and cholesterol are controlled  ?

## 2021-03-27 NOTE — Assessment & Plan Note (Signed)
Suspect due to schedule  ?Labs reviewed ?Encouraged self care  ?

## 2021-03-27 NOTE — Assessment & Plan Note (Signed)
Disc goals for lipids and reasons to control them ?Rev last labs with pt ?Rev low sat fat diet in detail ?LDL down to 101 ?Encouraged healthy diet  ?

## 2021-03-27 NOTE — Assessment & Plan Note (Signed)
Lab Results  ?Component Value Date  ? HGBA1C 5.9 03/19/2021  ? ?disc imp of low glycemic diet and wt loss to prevent DM2  ?

## 2021-03-27 NOTE — Assessment & Plan Note (Signed)
Colonoscopy 2015 ?Declines cologaurd or ifob at this time  ?No bowel changes  ?

## 2021-03-27 NOTE — Assessment & Plan Note (Signed)
takex nexium 40 mg  ?One recent flare resolved  ?Has been unable to wean ?

## 2021-03-27 NOTE — Patient Instructions (Signed)
Take care of yourself  ? ?Eat a balance diet and drink lots of fluids  ? ?Let me know in the future if you want a bone density test or colon screening  ? ?Get exercise when you can  ? ?Labs look stable  ? ?Avoid red meat/ fried foods/ egg yolks/ fatty breakfast meats/ butter, cheese and high fat dairy/ and shellfish   ?

## 2021-05-28 DIAGNOSIS — H0100A Unspecified blepharitis right eye, upper and lower eyelids: Secondary | ICD-10-CM | POA: Diagnosis not present

## 2021-05-28 DIAGNOSIS — H04123 Dry eye syndrome of bilateral lacrimal glands: Secondary | ICD-10-CM | POA: Diagnosis not present

## 2021-05-28 DIAGNOSIS — H52203 Unspecified astigmatism, bilateral: Secondary | ICD-10-CM | POA: Diagnosis not present

## 2021-05-28 DIAGNOSIS — H353132 Nonexudative age-related macular degeneration, bilateral, intermediate dry stage: Secondary | ICD-10-CM | POA: Diagnosis not present

## 2021-11-15 ENCOUNTER — Telehealth: Payer: Self-pay

## 2021-11-15 NOTE — Telephone Encounter (Signed)
Spoke with patient and offered to do VV with her today at 4:30 pm if she felt like she needed treatment. Patient states her temp has went down to 99.0 since taking tylenol and just feels sluggish. Patient declined doing appt and stated she will keep taking OTC meds. Stated she will go to UC if she needs to this weekend.

## 2021-11-15 NOTE — Telephone Encounter (Signed)
I spoke with pt;pts symptoms started on 11/12/21 and on 11/13/21 tested neg for covid; pt continued with fever 102 and dry cough; runny nose. Pt tested + covid 11/15/21. Pt said today temp 100 earlier and now 99.2 without fever reducing med. Pt still has dry cough; no CP,SOB or wheezing. Pt does have runny nose. At times pt has lightheadedness on and off.Pt does not have my chart and does not want my chart; pt will do phone visit only. No available appts at [facility] or [facility]. Pt was going to schedule virtual phone visit at [facility] but could not do on computer; I called [facility] [phone] and spoke with Foye Spurling and [facility] does not do virtual video visits or phone visits.I spoke with pt and she said she actually feels better today but pts sister wanted her to call about possible antiviral med. Pt said with lightheadedness on and off pt does not want to drive car. Dr Glori Bickers out of office and sending note to Dr Damita Dunnings and Janett Billow CMA and will teams [place]. Pt request cb after reviewed by provider. [facility] [place] [facility] precautions given and pt voiced understanding.

## 2021-11-15 NOTE — Telephone Encounter (Signed)
[facility] - Client TELEPHONE ADVICE RECORD [facility] Patient Name: Gina Williams Gender: Female DOB: 1945/03/28 Age: 76 Y 2 D Return Phone Number: [phone] (Primary) Address: City/ State/ Zip: [place] [address] Client [facility] - Client Client Site [place] Provider [place], Roque Lias - MD Contact Type Call Who Is Calling Patient / Member / Family / Caregiver Call Type Triage / Clinical Relationship To Patient Self Return Phone Number Please choose phone number Chief Complaint Fever (non-urgent symptom) (greater than THREE MONTHS old) Reason for Call Symptomatic / Request for [place] states she tested positive for covid, she is experiencing a temp of 102, cough, and runny nose. Translation No Nurse Assessment Nurse: Redmond Pulling, RN, Levada Dy Date/Time Eilene Ghazi Time): 11/15/2021 8:30:12 AM Confirm and document reason for call. If symptomatic, describe symptoms. ---Caller states she tested positive for Covid this morning on a home test, she is experiencing a temp of 100.5 forehead thermometer cough, and runny nose. Does the patient have any new or worsening symptoms? ---Yes Will a triage be completed? ---Yes Related visit to physician within the last 2 weeks? ---No Does the PT have any chronic conditions? (i.e. diabetes, asthma, this includes High risk factors for pregnancy, etc.) ---No Is this a behavioral health or substance abuse call? ---No Guidelines Guideline Title Affirmed Question Affirmed Notes Nurse Date/Time ([place] Time) COVID-19 - Diagnosed or Suspected [1] HIGH RISK patient (e.g., weak immune system, age > 58 years, obesity with BMI 30 or higher, pregnant, chronic lung disease or other chronic medical condition) AND [2] COVID symptoms (e.g., Redmond Pulling, RN, Levada Dy 11/15/2021 8:33:07 AM PLEASE NOTE: All timestamps contained  within this report are represented as [place] Standard Time. CONFIDENTIALTY NOTICE: This fax transmission is intended only for the addressee. It contains information that is legally privileged, confidential or otherwise protected from use or disclosure. If you are not the intended recipient, you are strictly prohibited from reviewing, disclosing, copying using or disseminating any of this information or taking any action in reliance on or regarding this information. If you have received this fax in error, please notify us immediately by telephone so that we can arrange for its return to [place]. Phone: [phone], Toll-Free: [phone], Fax: [phone] Page: 2 of 2 Call Id: [id number] Guidelines Guideline Title Affirmed Question Affirmed Notes Nurse Date/Time Eilene Ghazi Time) cough, fever) (Exceptions: Already seen by PCP and no new or worsening symptoms.) Disp. Time Eilene Ghazi Time) Disposition Final User 11/15/2021 8:40:08 AM Call PCP within 24 Hours Yes Redmond Pulling, RN, Levada Dy Final Disposition 11/15/2021 8:40:08 AM Call PCP within 24 Hours Yes Redmond Pulling, RN, Marin Shutter Disagree/Comply Disagree Caller Understands Yes PreDisposition Call Doctor Care Advice Given Per Guideline CALL PCP WITHIN 24 HOURS: * You need to discuss this with your doctor (or NP/PA) within the next 24 hours. * Feeling dehydrated: Drink extra liquids. If the air in your home is dry, use a humidifier. * Muscle aches, headache, and other pains: Often this comes and goes with the fever. Take acetaminophen every 4 to 6 hours (Adults 650 mg) OR ibuprofen every 6 to 8 hours (Adults 400 mg). Before taking any medicine, read all the instructions on the package. FEVER MEDICINES: * For fevers above 101 F (38.3 C) take either acetaminophen or ibuprofen. CALL BACK IF: CARE ADVICE given per COVID-19 - DIAGNOSED OR SUSPECTED (Adult) guideline. * IF OFFICE WILL BE OPEN: Call the office when it opens tomorrow morning. Referrals GO TO  FACILITY REFUSE

## 2021-11-15 NOTE — Telephone Encounter (Signed)
If she if feeling unwell, enough to need treatment, then would need virtual visit.  Okay to add on with me at 4:30 if needed.  Thanks.

## 2021-11-27 ENCOUNTER — Other Ambulatory Visit: Payer: Self-pay | Admitting: Family Medicine

## 2021-11-27 DIAGNOSIS — Z1231 Encounter for screening mammogram for malignant neoplasm of breast: Secondary | ICD-10-CM

## 2021-11-29 ENCOUNTER — Ambulatory Visit (INDEPENDENT_AMBULATORY_CARE_PROVIDER_SITE_OTHER): Payer: Medicare Other | Admitting: Family Medicine

## 2021-11-29 ENCOUNTER — Encounter: Payer: Self-pay | Admitting: Family Medicine

## 2021-11-29 VITALS — BP 116/64 | HR 88 | Temp 98.0°F | Ht 63.0 in | Wt 138.5 lb

## 2021-11-29 DIAGNOSIS — J0100 Acute maxillary sinusitis, unspecified: Secondary | ICD-10-CM

## 2021-11-29 MED ORDER — AMOXICILLIN 500 MG PO CAPS
500.0000 mg | ORAL_CAPSULE | Freq: Two times a day (BID) | ORAL | 0 refills | Status: AC
Start: 2021-11-29 — End: 2021-12-09

## 2021-11-29 NOTE — Patient Instructions (Addendum)
Take care of yourself  Drink fluids and rest   Use your saline spray and fluticasone  Take the amoxicillin as directed   Update if not starting to improve in a week or if worsening

## 2021-11-29 NOTE — Progress Notes (Signed)
Subjective:    Patient ID: Gina Williams, female    DOB: [birth year], 76 y.o.   MRN: [id number]  HPI Pt presents for eye and ear pain   Wt Readings from Last 3 Encounters:  11/29/21 138 lb 8 oz (62.8 kg)  03/27/21 137 lb (62.1 kg)  03/05/21 139 lb (63 kg)   24.53 kg/m  Lost her mom Was trying to get back out and walk and go to church   Pt had covid at the end of October  Had temp of 102 to start  Go through that   Has a pain in R side of her head  Sinuses are draining/lots of pnd - improving  In between yellow and green  Blowing nose-lots of congestion   Throat : no pain / is tickly R ear hurts on /off -was worse mon/tues   A little cough at night from drainage  No wheeze or sob    She found an old bottle of amox and took it 3 d  Thinks pain improved from that   Otc Saline spray and flonase  Tylenol when she had fever and headache   Patient Active Problem List   Diagnosis Date Noted   Fatigue 06/21/2019   Parent refuses immunizations 06/29/2018   Elevated glucose level 06/20/2018   Cystocele with prolapse 09/22/2017   Acute sinusitis 06/25/2017   S/P hysterectomy 10/23/2016   Atherosclerosis of aorta ([place]) 09/23/2016   Routine general medical examination at a health care facility 12/01/2014   Estrogen deficiency 12/01/2014   Osteopenia 11/28/2013   Colon cancer screening 11/03/2012   Hyperlipidemia 11/03/2012   Encounter for Medicare annual wellness exam 10/26/2012   GERD 01/23/2010   History of breast cancer 01/23/2010   Past Medical History:  Diagnosis Date   Arthritis    hands   Breast cancer ([place]) 09/16/2007   Mastectomy   Dyspnea    last 5 months, lack of conditioning   GERD (gastroesophageal reflux disease)    Hypoglycemic reaction    fainted in her youth , hadnt eaten anything for hours and was assisting with a 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 BMD    Personal history of chemotherapy    PONV (postoperative nausea and vomiting)    SEVERE, patient is very concerned about this issue occurred with mastectomy sugery ; no issues whn she had hysterectomy    Vertigo 2007   Past Surgical History:  Procedure Laterality Date   ANTERIOR AND POSTERIOR REPAIR N/A 09/22/2017   Procedure: CYSTOSCOPY ANTERIOR (CYSTOCELE);  Surgeon: Bjorn Loser, MD;  Location: [facility];  Service: Urology;  Laterality: N/A;   BASAL CELL CARCINOMA EXCISION N/A 12/03/2020   CATARACT EXTRACTION, BILATERAL     COLONOSCOPY     CYSTOSCOPY N/A 10/23/2016   Procedure: CYSTOSCOPY;  Surgeon: Aloha Gell, MD;  Location: [facility];  Service: Gynecology;  Laterality: N/A;   GANGLION CYST EXCISION Left 1970s   Multiple surgeries   HAND SURGERY Left 10/2005   Nerve damage   HAND SURGERY Left 2011   Torn ligament surgery   MASTECTOMY  08/2007   Breast Cancer   Nuclear Stress Test     Negative  EF 77%   pap  08/05/2014   [facility]   pessary  12/19/2015   [facility] Dr. Pamala Hurry   ROBOTIC ASSISTED TOTAL HYSTERECTOMY WITH BILATERAL SALPINGO OOPHERECTOMY Bilateral  10/23/2016   Procedure: ROBOTIC ASSISTED TOTAL HYSTERECTOMY WITH BILATERAL SALPINGO OOPHORECTOMY/Uterosacral Ligament Suspension;  Surgeon: Aloha Gell, MD;  Location: [facility];  Service: Gynecology;  Laterality: Bilateral;   SHOULDER SURGERY Bilateral Late 1980s   Frozen shoulders bilaterally, no surgery, had physical therapy   Stress myoview  12/2003   Low risk   TONSILLECTOMY  Late 1970s   TUBAL LIGATION     VAGINAL PROLAPSE REPAIR N/A 09/22/2017   Procedure: VAGINAL VAULT SUSPENSION AND GRAFT;  Surgeon: Bjorn Loser, MD;  Location: [facility];  Service: Urology;  Laterality: N/A;   YAG LASER APPLICATION Bilateral 29/79/8921   Social History   Tobacco Use   Smoking status: Former    Types: Cigarettes    Quit date: 05/21/1998    Years since quitting: 23.5   Smokeless tobacco: Never  Vaping  Use   Vaping Use: Never used  Substance Use Topics   Alcohol use: No    Alcohol/week: 0.0 standard drinks of alcohol   Drug use: No   Family History  Problem Relation Age of Onset   Asthma Mother    Aortic aneurysm Mother    Stroke Father    Alzheimer's disease Father    Cancer Paternal Aunt        Breast Cancer   Breast cancer Paternal Aunt    Cancer Paternal Aunt        Breast Cancer   Breast cancer Paternal Aunt    Cancer Paternal Aunt        Breast Cancer   Breast cancer Paternal Aunt    Stroke Maternal Uncle    Hypertension Other        HTN on Father's side of family   Allergies  Allergen Reactions   Amoxicillin-Pot Clavulanate Nausea Only and Other (See Comments)    REACTION: nausea (tolerates amoxil) Has patient had a PCN reaction causing immediate rash, facial/tongue/throat swelling, SOB or lightheadedness with hypotension: No Has patient had a PCN reaction causing severe rash involving mucus membranes or skin necrosis: Unknown Has patient had a PCN reaction that required hospitalization: No Has patient had a PCN reaction occurring within the last 10 years: No If all of the above answers are "NO", then may proceed with Cephalosporin use.    Omeprazole Other (See Comments)    ineffective    Other Nausea And Vomiting and Other (See Comments)    Anesthesia-vomiting/dry heaves (up to ~12hrs after)   Current Outpatient Medications on File Prior to Visit  Medication Sig Dispense Refill   acetaminophen (TYLENOL) 500 MG tablet Take 500 mg by mouth every 6 (six) hours as needed for moderate pain or headache.      Cholecalciferol (VITAMIN D3 PO) Take 2,000 Units by mouth daily.      diphenhydrAMINE (BENADRYL) 25 mg capsule Take 25 mg by mouth at bedtime.     esomeprazole (NEXIUM) 40 MG capsule Take 1 capsule (40 mg total) by mouth daily. 90 capsule 3   fluticasone (FLONASE) 50 MCG/ACT nasal spray USE 2 SPRAY(S) IN EACH NOSTRIL ONCE DAILY AS NEEDED FOR  ALLERGIES  OR   RHINITIS 48 g 1   Omega-3 Fatty Acids (FISH OIL PO) Take 1,900 mg by mouth daily.     Polyethyl Glycol-Propyl Glycol (SYSTANE) 0.4-0.3 % SOLN Apply to eye.     No current facility-administered medications on file prior to visit.    Review of Systems  Constitutional:  Positive for fatigue. Negative for activity change, appetite change, fever and unexpected weight change.  HENT:  Positive for postnasal drip, sinus pressure and sinus pain. Negative for congestion, ear pain, nosebleeds, rhinorrhea and sore throat.   Eyes:  Negative for pain, redness, itching and visual disturbance.  Respiratory:  Negative for cough, shortness of breath and wheezing.   Cardiovascular:  Negative for chest pain and palpitations.  Gastrointestinal:  Negative for abdominal pain, blood in stool, constipation, diarrhea, nausea and vomiting.  Endocrine: Negative for polydipsia and polyuria.  Genitourinary:  Negative for dysuria, frequency and urgency.  Musculoskeletal:  Negative for arthralgias, back pain and myalgias.  Skin:  Negative for pallor and rash.  Allergic/Immunologic: Negative for environmental allergies and immunocompromised state.  Neurological:  Negative for dizziness, tremors, syncope, weakness, numbness and headaches.  Hematological:  Negative for adenopathy. Does not bruise/bleed easily.  Psychiatric/Behavioral:  Negative for decreased concentration and dysphoric mood. The patient is not nervous/anxious.        Objective:   Physical Exam Constitutional:      General: She is not in acute distress.    Appearance: Normal appearance. She is well-developed and normal weight. She is not ill-appearing.  HENT:     Head: Normocephalic and atraumatic.     Comments: Bilateral maxillary and frontal sinus tenderness worse on R  R frontal sinus tenderness  No swelling      Right Ear: Tympanic membrane and external ear normal.     Left Ear: Tympanic membrane and external ear normal.     Nose: Congestion  and rhinorrhea present.     Mouth/Throat:     Pharynx: Oropharynx is clear. No oropharyngeal exudate or posterior oropharyngeal erythema.     Comments: Clear pnd Eyes:     General:        Right eye: No discharge.        Left eye: No discharge.     Conjunctiva/sclera: Conjunctivae normal.     Pupils: Pupils are equal, round, and reactive to light.  Cardiovascular:     Rate and Rhythm: Normal rate and regular rhythm.  Pulmonary:     Effort: Pulmonary effort is normal. No respiratory distress.     Breath sounds: Normal breath sounds. No wheezing or rales.     Comments: Good air exch No rales or rhonchi Musculoskeletal:     Cervical back: Normal range of motion and neck supple.  Lymphadenopathy:     Cervical: No cervical adenopathy.  Skin:    General: Skin is warm and dry.     Findings: No rash.  Neurological:     Mental Status: She is alert.     Cranial Nerves: No cranial nerve deficit.     Coordination: Coordination normal.  Psychiatric:        Mood and Affect: Mood normal.           Assessment & Plan:   Problem List Items Addressed This Visit       Respiratory   Acute sinusitis - Primary    S/p covid last month with R sided sinus pain and pressure Improved after 3 d of left over amox (cannot take augmentin due to nausea but can take amox)  Tx with amox 500 mg bid Fluids/rest Saline, flonase prn  Update if not starting to improve in a week or if worsening   ER if sudden severe symptoms  Handout given       Relevant Medications   amoxicillin (AMOXIL) 500 MG capsule

## 2021-11-29 NOTE — Assessment & Plan Note (Signed)
S/p covid last month with R sided sinus pain and pressure Improved after 3 d of left over amox (cannot take augmentin due to nausea but can take amox)  Tx with amox 500 mg bid Fluids/rest Saline, flonase prn  Update if not starting to improve in a week or if worsening   ER if sudden severe symptoms  Handout given

## 2021-12-26 DIAGNOSIS — D1801 Hemangioma of skin and subcutaneous tissue: Secondary | ICD-10-CM | POA: Diagnosis not present

## 2021-12-26 DIAGNOSIS — D2271 Melanocytic nevi of right lower limb, including hip: Secondary | ICD-10-CM | POA: Diagnosis not present

## 2021-12-26 DIAGNOSIS — L738 Other specified follicular disorders: Secondary | ICD-10-CM | POA: Diagnosis not present

## 2021-12-26 DIAGNOSIS — Z85828 Personal history of other malignant neoplasm of skin: Secondary | ICD-10-CM | POA: Diagnosis not present

## 2021-12-26 DIAGNOSIS — D2272 Melanocytic nevi of left lower limb, including hip: Secondary | ICD-10-CM | POA: Diagnosis not present

## 2021-12-26 DIAGNOSIS — L820 Inflamed seborrheic keratosis: Secondary | ICD-10-CM | POA: Diagnosis not present

## 2021-12-26 DIAGNOSIS — L821 Other seborrheic keratosis: Secondary | ICD-10-CM | POA: Diagnosis not present

## 2021-12-26 DIAGNOSIS — L720 Epidermal cyst: Secondary | ICD-10-CM | POA: Diagnosis not present

## 2022-01-23 ENCOUNTER — Ambulatory Visit (INDEPENDENT_AMBULATORY_CARE_PROVIDER_SITE_OTHER)
Admission: RE | Admit: 2022-01-23 | Discharge: 2022-01-23 | Disposition: A | Discharge status: Home / Self Care | Payer: Medicare Other | Source: Ambulatory Visit | Attending: Family Medicine | Admitting: Family Medicine

## 2022-01-23 ENCOUNTER — Encounter: Payer: Self-pay | Admitting: Family Medicine

## 2022-01-23 ENCOUNTER — Ambulatory Visit (INDEPENDENT_AMBULATORY_CARE_PROVIDER_SITE_OTHER): Payer: Medicare Other | Admitting: Family Medicine

## 2022-01-23 VITALS — BP 142/76 | HR 75 | Temp 97.2°F | Ht 63.0 in | Wt 138.4 lb

## 2022-01-23 DIAGNOSIS — Z23 Encounter for immunization: Secondary | ICD-10-CM

## 2022-01-23 DIAGNOSIS — S81802A Unspecified open wound, left lower leg, initial encounter: Secondary | ICD-10-CM | POA: Diagnosis not present

## 2022-01-23 MED ORDER — MUPIROCIN 2 % EX OINT: 1.0000 | TOPICAL_OINTMENT | Freq: Two times a day (BID) | CUTANEOUS | 0 refills | Status: DC

## 2022-01-23 NOTE — Progress Notes (Signed)
Subjective:    Patient ID: Gina Williams, female    DOB: 12/19/1945, 77 y.o.   MRN: [id number]  HPI Pt presents with sore/wound on lleft eg   Wt Readings from Last 3 Encounters:  01/23/22 138 lb 6 oz (62.8 kg)  11/29/21 138 lb 8 oz (62.8 kg)  03/27/21 137 lb (62.1 kg)   24.51 kg/m  Vitals:   01/23/22 1005  BP: (!) 142/76  Pulse: 75  Temp: (!) 97.2 F (36.2 C)  TempSrc: Temporal  SpO2: 93%  Weight: 138 lb 6 oz (62.8 kg)  Height: '5\' 3"'$  (1.6 m)     Injured leg when a pc of wood fell on it (hurt)  It broke the skin  A little blood/not much  Had scab with dried blood for a while  Sore but not severe   Saw derm for yrly check on 12/7 and they started panoxyl (stickers) on leg and rec supp socks  Stickers started to burn her skin  Uses some peroxide and water  She used this for 2 1/2 wk then stopped Socks make legs itch-wears on and off    Now hurts some at night  Around the area- over the medial lower shin  Occ ankle swells      Patient Active Problem List   Diagnosis Date Noted   Wound, open, leg, left, initial encounter 01/23/2022   Fatigue 06/21/2019   Parent refuses immunizations 06/29/2018   Elevated glucose level 06/20/2018   Cystocele with prolapse 09/22/2017   S/P hysterectomy 10/23/2016   Atherosclerosis of aorta ([place]) 09/23/2016   Routine general medical examination at a health care facility 12/01/2014   Estrogen deficiency 12/01/2014   Osteopenia 11/28/2013   Colon cancer screening 11/03/2012   Hyperlipidemia 11/03/2012   Encounter for Medicare annual wellness exam 10/26/2012   GERD 01/23/2010   History of breast cancer 01/23/2010   Past Medical History:  Diagnosis Date   Arthritis    hands   Breast cancer ([place]) 09/16/2007   Mastectomy   Dyspnea    last 5 months, lack of conditioning   GERD (gastroesophageal reflux disease)    Hypoglycemic reaction    fainted in her youth , hadnt eaten anything for hours and was assisting with  a 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 BMD   Personal history of chemotherapy    PONV (postoperative nausea and vomiting)    SEVERE, patient is very concerned about this issue occurred with mastectomy sugery ; no issues whn she had hysterectomy    Vertigo 2007   Past Surgical History:  Procedure Laterality Date   ANTERIOR AND POSTERIOR REPAIR N/A 09/22/2017   Procedure: CYSTOSCOPY ANTERIOR (CYSTOCELE);  Surgeon: Bjorn Loser, MD;  Location: [facility];  Service: Urology;  Laterality: N/A;   BASAL CELL CARCINOMA EXCISION N/A 12/03/2020   CATARACT EXTRACTION, BILATERAL     COLONOSCOPY     CYSTOSCOPY N/A 10/23/2016   Procedure: CYSTOSCOPY;  Surgeon: Aloha Gell, MD;  Location: [facility];  Service: Gynecology;  Laterality: N/A;   GANGLION CYST EXCISION Left 1970s   Multiple surgeries   HAND SURGERY Left 10/2005   Nerve damage   HAND SURGERY Left 2011   Torn ligament surgery   MASTECTOMY  08/2007   Breast Cancer   Nuclear Stress Test     Negative  EF 77%   pap  08/05/2014  [facility]   pessary  12/19/2015   [facility] Dr. Pamala Hurry   ROBOTIC ASSISTED TOTAL HYSTERECTOMY WITH BILATERAL SALPINGO OOPHERECTOMY Bilateral 10/23/2016   Procedure: ROBOTIC ASSISTED TOTAL HYSTERECTOMY WITH BILATERAL SALPINGO OOPHORECTOMY/Uterosacral Ligament Suspension;  Surgeon: Aloha Gell, MD;  Location: [facility];  Service: Gynecology;  Laterality: Bilateral;   SHOULDER SURGERY Bilateral Late 1980s   Frozen shoulders bilaterally, no surgery, had physical therapy   Stress myoview  12/2003   Low risk   TONSILLECTOMY  Late 1970s   TUBAL LIGATION     VAGINAL PROLAPSE REPAIR N/A 09/22/2017   Procedure: VAGINAL VAULT SUSPENSION AND GRAFT;  Surgeon: Bjorn Loser, MD;  Location: [facility];  Service: Urology;  Laterality: N/A;   YAG LASER APPLICATION Bilateral 95/63/8756   Social History   Tobacco Use   Smoking status:  Former    Types: Cigarettes    Quit date: 05/21/1998    Years since quitting: 23.6   Smokeless tobacco: Never  Vaping Use   Vaping Use: Never used  Substance Use Topics   Alcohol use: No    Alcohol/week: 0.0 standard drinks of alcohol   Drug use: No   Family History  Problem Relation Age of Onset   Asthma Mother    Aortic aneurysm Mother    Stroke Father    Alzheimer's disease Father    Cancer Paternal Aunt        Breast Cancer   Breast cancer Paternal Aunt    Cancer Paternal Aunt        Breast Cancer   Breast cancer Paternal Aunt    Cancer Paternal Aunt        Breast Cancer   Breast cancer Paternal Aunt    Stroke Maternal Uncle    Hypertension Other        HTN on Father's side of family   Allergies  Allergen Reactions   Amoxicillin-Pot Clavulanate Nausea Only and Other (See Comments)    REACTION: nausea (tolerates amoxil) Has patient had a PCN reaction causing immediate rash, facial/tongue/throat swelling, SOB or lightheadedness with hypotension: No Has patient had a PCN reaction causing severe rash involving mucus membranes or skin necrosis: Unknown Has patient had a PCN reaction that required hospitalization: No Has patient had a PCN reaction occurring within the last 10 years: No If all of the above answers are "NO", then may proceed with Cephalosporin use.    Omeprazole Other (See Comments)    ineffective    Other Nausea And Vomiting and Other (See Comments)    Anesthesia-vomiting/dry heaves (up to ~12hrs after)   Current Outpatient Medications on File Prior to Visit  Medication Sig Dispense Refill   acetaminophen (TYLENOL) 500 MG tablet Take 500 mg by mouth every 6 (six) hours as needed for moderate pain or headache.      Cholecalciferol (VITAMIN D3 PO) Take 2,000 Units by mouth daily.      diphenhydrAMINE (BENADRYL) 25 mg capsule Take 25 mg by mouth at bedtime.     esomeprazole (NEXIUM) 40 MG capsule Take 1 capsule (40 mg total) by mouth daily. 90 capsule 3    fluticasone (FLONASE) 50 MCG/ACT nasal spray USE 2 SPRAY(S) IN EACH NOSTRIL ONCE DAILY AS NEEDED FOR  ALLERGIES  OR  RHINITIS 48 g 1   melatonin 5 MG TABS Take 5 mg by mouth at bedtime as needed.     Omega-3 Fatty Acids (FISH OIL PO) Take 1,900 mg by mouth daily.     Polyethyl Glycol-Propyl Glycol (SYSTANE) 0.4-0.3 %  SOLN Apply to eye.     No current facility-administered medications on file prior to visit.     Review of Systems  Constitutional:  Negative for activity change, appetite change, fatigue, fever and unexpected weight change.  HENT:  Negative for congestion, ear pain, rhinorrhea, sinus pressure and sore throat.   Eyes:  Negative for pain, redness and visual disturbance.  Respiratory:  Negative for cough, shortness of breath and wheezing.   Cardiovascular:  Negative for chest pain and palpitations.  Gastrointestinal:  Negative for abdominal pain, blood in stool, constipation and diarrhea.  Endocrine: Negative for polydipsia and polyuria.  Genitourinary:  Negative for dysuria, frequency and urgency.  Musculoskeletal:  Negative for arthralgias, back pain and myalgias.  Skin:  Positive for wound. Negative for pallor and rash.  Allergic/Immunologic: Negative for environmental allergies.  Neurological:  Negative for dizziness, syncope and headaches.  Hematological:  Negative for adenopathy. Does not bruise/bleed easily.  Psychiatric/Behavioral:  Negative for decreased concentration and dysphoric mood. The patient is not nervous/anxious.        Objective:   Physical Exam Nursing note reviewed.  Constitutional:      General: She is not in acute distress.    Appearance: Normal appearance. She is normal weight. She is not ill-appearing.  Eyes:     Conjunctiva/sclera: Conjunctivae normal.     Pupils: Pupils are equal, round, and reactive to light.  Cardiovascular:     Rate and Rhythm: Normal rate and regular rhythm.  Pulmonary:     Effort: Pulmonary effort is normal. No  respiratory distress.  Musculoskeletal:     Comments: Varicose veins-small and compressible on legs from knee down     Skin:    Comments: Pink 1 by 1.5 cm wound on medial L lower leg  No open areas  Mild swelling Mild tenderness   Per pt is much smaller than it was originally   No palp cords  Neg homan's sign  Nl perfusion of leg and foot Baseline varicosities noted   Neurological:     Mental Status: She is alert.     Sensory: No sensory deficit.  Psychiatric:        Mood and Affect: Mood normal.           Assessment & Plan:   Problem List Items Addressed This Visit       Other   Wound, open, leg, left, initial encounter - Primary    Small mostly healed wound on L medial lower leg after injury with a log late in nov  Derm tx with panoxyl patches -she did not tolerate Has venous insuff- likely delaying healing (supp socks itch but she is willing to try again Td updated today  Xray of tib fib to r/o injury (less likely osteomyelitis) Inst to keep clean with soap/water Bactroban px to use bid with non stick bandage as needed Disc s/s of cellulitis to watch for  Update if not starting to improve in a week or if worsening        Relevant Orders   DG Tibia/Fibula Left (Completed)   Td : Tetanus/diphtheria >7yo Preservative  free (Completed)

## 2022-01-23 NOTE — Assessment & Plan Note (Signed)
Small mostly healed wound on L medial lower leg after injury with a log late in nov  Derm tx with panoxyl patches -she did not tolerate Has venous insuff- likely delaying healing (supp socks itch but she is willing to try again Td updated today  Xray of tib fib to r/o injury (less likely osteomyelitis) Inst to keep clean with soap/water Bactroban px to use bid with non stick bandage as needed Disc s/s of cellulitis to watch for  Update if not starting to improve in a week or if worsening

## 2022-01-23 NOTE — Patient Instructions (Addendum)
Don't use any more peroxide on the wound  Clean with soap and water Use support hose/socks as needed  Tetanus shot today   Xray of leg today  We will call with a result   Elevate your leg whenever you can   Use the bactroban ointment twice daily  This will take a while to heal   If worse Red Swelling Pain Not improving  Let us know

## 2022-01-29 ENCOUNTER — Ambulatory Visit
Admission: RE | Admit: 2022-01-29 | Discharge: 2022-01-29 | Disposition: A | Discharge status: Home / Self Care | Payer: Medicare Other | Source: Ambulatory Visit | Attending: Family Medicine | Admitting: Family Medicine

## 2022-01-29 DIAGNOSIS — Z1231 Encounter for screening mammogram for malignant neoplasm of breast: Secondary | ICD-10-CM | POA: Diagnosis not present

## 2022-03-06 ENCOUNTER — Ambulatory Visit (INDEPENDENT_AMBULATORY_CARE_PROVIDER_SITE_OTHER): Payer: Medicare Other

## 2022-03-06 VITALS — Ht 63.0 in | Wt 138.0 lb

## 2022-03-06 DIAGNOSIS — Z0000 Encounter for general adult medical examination without abnormal findings: Secondary | ICD-10-CM | POA: Diagnosis not present

## 2022-03-06 NOTE — Patient Instructions (Signed)
Ms. Gina Williams , Thank you for taking time to come for your Medicare Wellness Visit. I appreciate your ongoing commitment to your health goals. Please review the following plan we discussed and let me know if I can assist you in the future.   These are the goals we discussed:  Goals      DIET - INCREASE WATER INTAKE     Starting 06/19/2018, I will continue to drink at least 50-60 ounces of water daily.      Patient Stated     Would like to maintain current routine.     Patient Stated     Would like to start walking more.        This is a list of the screening recommended for you and due dates:  Health Maintenance  Topic Date Due   Zoster (Shingles) Vaccine (1 of 2) Never done   Pneumonia Vaccine (1 of 1 - PCV) Never done   COVID-19 Vaccine (4 - 2023-24 season) 09/20/2021   Flu Shot  04/20/2022*   Mammogram  01/30/2023   Medicare Annual Wellness Visit  03/07/2023   DTaP/Tdap/Td vaccine (3 - Tdap) 01/24/2032   DEXA scan (bone density measurement)  Completed   Hepatitis C Screening: USPSTF Recommendation to screen - Ages 8-79 yo.  Completed   HPV Vaccine  Aged Out   Colon Cancer Screening  Discontinued  *Topic was postponed. The date shown is not the original due date.    Advanced directives: copy is in chart.  Conditions/risks identified: .Aim for 30 minutes of exercise or brisk walking, 6-8 glasses of water, and 5 servings of fruits and vegetables each day.   Vaccinations: declines all Influenza vaccine: recommend every Fall Pneumococcal vaccine: recommend once per lifetime Prevnar-20 Tdap vaccine: recommend every 10 years Shingles vaccine: recommend Shingrix which is 2 doses 2-6 months apart and over 90% effective     Covid-19: recommend 2 doses one month apart with a booster 6 months later   Next appointment: Follow up in one year for your annual wellness visit 03/11/2023 @ 8:30 via telephone.   Preventive Care 26 Years and Older, Female Preventive care refers to  lifestyle choices and visits with your health care provider that can promote health and wellness. What does preventive care include? A yearly physical exam. This is also called an annual well check. Dental exams once or twice a year. Routine eye exams. Ask your health care provider how often you should have your eyes checked. Personal lifestyle choices, including: Daily care of your teeth and gums. Regular physical activity. Eating a healthy diet. Avoiding tobacco and drug use. Limiting alcohol use. Practicing safe sex. Taking low-dose aspirin every day. Taking vitamin and mineral supplements as recommended by your health care provider. What happens during an annual well check? The services and screenings done by your health care provider during your annual well check will depend on your age, overall health, lifestyle risk factors, and family history of disease. Counseling  Your health care provider may ask you questions about your: Alcohol use. Tobacco use. Drug use. Emotional well-being. Home and relationship well-being. Sexual activity. Eating habits. History of falls. Memory and ability to understand (cognition). Work and work Statistician. Reproductive health. Screening  You may have the following tests or measurements: Height, weight, and BMI. Blood pressure. Lipid and cholesterol levels. These may be checked every 5 years, or more frequently if you are over 41 years old. Skin check. Lung cancer screening. You may have this screening every  year starting at age 70 if you have a 30-pack-year history of smoking and currently smoke or have quit within the past 15 years. Fecal occult blood test (FOBT) of the stool. You may have this test every year starting at age 33. Flexible sigmoidoscopy or colonoscopy. You may have a sigmoidoscopy every 5 years or a colonoscopy every 10 years starting at age 36. Hepatitis C blood test. Hepatitis B blood test. Sexually transmitted disease  (STD) testing. Diabetes screening. This is done by checking your blood sugar (glucose) after you have not eaten for a while (fasting). You may have this done every 1-3 years. Bone density scan. This is done to screen for osteoporosis. You may have this done starting at age 26. Mammogram. This may be done every 1-2 years. Talk to your health care provider about how often you should have regular mammograms. Talk with your health care provider about your test results, treatment options, and if necessary, the need for more tests. Vaccines  Your health care provider may recommend certain vaccines, such as: Influenza vaccine. This is recommended every year. Tetanus, diphtheria, and acellular pertussis (Tdap, Td) vaccine. You may need a Td booster every 10 years. Zoster vaccine. You may need this after age 68. Pneumococcal 13-valent conjugate (PCV13) vaccine. One dose is recommended after age 27. Pneumococcal polysaccharide (PPSV23) vaccine. One dose is recommended after age 42. Talk to your health care provider about which screenings and vaccines you need and how often you need them. This information is not intended to replace advice given to you by your health care provider. Make sure you discuss any questions you have with your health care provider. Document Released: 02/02/2015 Document Revised: 09/26/2015 Document Reviewed: 11/07/2014 Elsevier Interactive Patient Education  2017 [place] Prevention in the Home Falls can cause injuries. They can happen to people of all ages. There are many things you can do to make your home safe and to help prevent falls. What can I do on the outside of my home? Regularly fix the edges of walkways and driveways and fix any cracks. Remove anything that might make you trip as you walk through a door, such as a raised step or threshold. Trim any bushes or trees on the path to your home. Use bright outdoor lighting. Clear any walking paths of anything  that might make someone trip, such as rocks or tools. Regularly check to see if handrails are loose or broken. Make sure that both sides of any steps have handrails. Any raised decks and porches should have guardrails on the edges. Have any leaves, snow, or ice cleared regularly. Use sand or salt on walking paths during winter. Clean up any spills in your garage right away. This includes oil or grease spills. What can I do in the bathroom? Use night lights. Install grab bars by the toilet and in the tub and shower. Do not use towel bars as grab bars. Use non-skid mats or decals in the tub or shower. If you need to sit down in the shower, use a plastic, non-slip stool. Keep the floor dry. Clean up any water that spills on the floor as soon as it happens. Remove soap buildup in the tub or shower regularly. Attach bath mats securely with double-sided non-slip rug tape. Do not have throw rugs and other things on the floor that can make you trip. What can I do in the bedroom? Use night lights. Make sure that you have a light by your bed that  is easy to reach. Do not use any sheets or blankets that are too big for your bed. They should not hang down onto the floor. Have a firm chair that has side arms. You can use this for support while you get dressed. Do not have throw rugs and other things on the floor that can make you trip. What can I do in the kitchen? Clean up any spills right away. Avoid walking on wet floors. Keep items that you use a lot in easy-to-reach places. If you need to reach something above you, use a strong step stool that has a grab bar. Keep electrical cords out of the way. Do not use floor polish or wax that makes floors slippery. If you must use wax, use non-skid floor wax. Do not have throw rugs and other things on the floor that can make you trip. What can I do with my stairs? Do not leave any items on the stairs. Make sure that there are handrails on both sides of  the stairs and use them. Fix handrails that are broken or loose. Make sure that handrails are as long as the stairways. Check any carpeting to make sure that it is firmly attached to the stairs. Fix any carpet that is loose or worn. Avoid having throw rugs at the top or bottom of the stairs. If you do have throw rugs, attach them to the floor with carpet tape. Make sure that you have a light switch at the top of the stairs and the bottom of the stairs. If you do not have them, ask someone to add them for you. What else can I do to help prevent falls? Wear shoes that: Do not have high heels. Have rubber bottoms. Are comfortable and fit you well. Are closed at the toe. Do not wear sandals. If you use a stepladder: Make sure that it is fully opened. Do not climb a closed stepladder. Make sure that both sides of the stepladder are locked into place. Ask someone to hold it for you, if possible. Clearly mark and make sure that you can see: Any grab bars or handrails. First and last steps. Where the edge of each step is. Use tools that help you move around (mobility aids) if they are needed. These include: Canes. Walkers. Scooters. Crutches. Turn on the lights when you go into a dark area. Replace any light bulbs as soon as they burn out. Set up your furniture so you have a clear path. Avoid moving your furniture around. If any of your floors are uneven, fix them. If there are any pets around you, be aware of where they are. Review your medicines with your doctor. Some medicines can make you feel dizzy. This can increase your chance of falling. Ask your doctor what other things that you can do to help prevent falls. This information is not intended to replace advice given to you by your health care provider. Make sure you discuss any questions you have with your health care provider. Document Released: 11/02/2008 Document Revised: 06/14/2015 Document Reviewed: 02/10/2014 Elsevier Interactive  Patient Education  2017 [employer].

## 2022-03-06 NOTE — Progress Notes (Signed)
I connected with  Gina Williams on 03/06/22 by a audio enabled telemedicine application and verified that I am speaking with the correct person using two identifiers.  Patient Location: Home  Provider Location: Office/Clinic  I discussed the limitations of evaluation and management by telemedicine. The patient expressed understanding and agreed to proceed.  Subjective:   Gina Williams is a 77 y.o. female who presents for Medicare Annual (Subsequent) preventive examination.  Review of Systems     Cardiac Risk Factors include: advanced age (>49mn, >>58women);dyslipidemia;sedentary lifestyle     Objective:    Today's Vitals   03/06/22 0945  Weight: 138 lb (62.6 kg)  Height: 5' 3"$  (1.6 m)  PainSc: 3    Body mass index is 24.45 kg/m.     03/06/2022    9:58 AM 03/05/2021    9:52 AM 06/22/2018   10:06 AM 09/22/2017    1:39 PM 09/22/2017   12:11 PM 09/22/2017    6:25 AM 09/14/2017    2:17 PM  Advanced Directives  Does Patient Have a Medical Advance Directive? Yes Yes No Yes Yes Yes Yes  Type of [place]  Living will  Living will Living will  Does patient want to make changes to medical advance directive?  Yes (MAU/Ambulatory/Procedural Areas - Information given)  No - Patient declined   No - Patient declined  Would patient like information on creating a medical advance directive?   No - Patient declined        Current Medications (verified) Outpatient Encounter Medications as of 03/06/2022  Medication Sig   acetaminophen (TYLENOL) 500 MG tablet Take 500 mg by mouth every 6 (six) hours as needed for moderate pain or headache.    Cholecalciferol (VITAMIN D3 PO) Take 2,000 Units by mouth daily.    diphenhydrAMINE (BENADRYL) 25 mg capsule Take 25 mg by mouth at bedtime.   esomeprazole (NEXIUM) 40 MG capsule Take 1 capsule (40 mg total) by mouth daily.   fluticasone (FLONASE) 50 MCG/ACT nasal spray USE 2 SPRAY(S) IN EACH  NOSTRIL ONCE DAILY AS NEEDED FOR  ALLERGIES  OR  RHINITIS   melatonin 5 MG TABS Take 5 mg by mouth at bedtime as needed.   naproxen sodium (ALEVE) 220 MG tablet Take 220 mg by mouth.   Omega-3 Fatty Acids (FISH OIL PO) Take 1,900 mg by mouth daily.   Polyethyl Glycol-Propyl Glycol (SYSTANE) 0.4-0.3 % SOLN Apply to eye.   mupirocin ointment (BACTROBAN) 2 % Apply 1 Application topically 2 (two) times daily. To affected area on leg (Patient not taking: Reported on 03/06/2022)   No facility-administered encounter medications on file as of 03/06/2022.    Allergies (verified) Amoxicillin-pot clavulanate, Omeprazole, and Other   History: Past Medical History:  Diagnosis Date   Arthritis    hands   Breast cancer ([facility] 09/16/2007   Mastectomy   Dyspnea    last 5 months, lack of conditioning   GERD (gastroesophageal reflux disease)    Hypoglycemic reaction    fainted in her youth , hadnt eaten anything for hours and was assisting with a 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 BMD   Personal history of chemotherapy    PONV (postoperative nausea and vomiting)    SEVERE, patient is very concerned about this issue occurred with mastectomy sugery ; no issues whn she had  hysterectomy    Vertigo 2007   Past Surgical History:  Procedure Laterality Date   ANTERIOR AND POSTERIOR REPAIR N/A 09/22/2017   Procedure: CYSTOSCOPY ANTERIOR (CYSTOCELE);  Surgeon: Bjorn Loser, MD;  Location: [facility];  Service: Urology;  Laterality: N/A;   BASAL CELL CARCINOMA EXCISION N/A 12/03/2020   CATARACT EXTRACTION, BILATERAL     COLONOSCOPY     CYSTOSCOPY N/A 10/23/2016   Procedure: CYSTOSCOPY;  Surgeon: Aloha Gell, MD;  Location: [facility];  Service: Gynecology;  Laterality: N/A;   GANGLION CYST EXCISION Left 1970s   Multiple surgeries   HAND SURGERY Left 10/2005   Nerve damage   HAND SURGERY Left 2011   Torn ligament surgery    MASTECTOMY  08/2007   Breast Cancer   Nuclear Stress Test     Negative  EF 77%   pap  08/05/2014   [facility]   pessary  12/19/2015   [facility] Dr. Pamala Hurry   ROBOTIC ASSISTED TOTAL HYSTERECTOMY WITH BILATERAL SALPINGO OOPHERECTOMY Bilateral 10/23/2016   Procedure: ROBOTIC ASSISTED TOTAL HYSTERECTOMY WITH BILATERAL SALPINGO OOPHORECTOMY/Uterosacral Ligament Suspension;  Surgeon: Aloha Gell, MD;  Location: [facility];  Service: Gynecology;  Laterality: Bilateral;   SHOULDER SURGERY Bilateral Late 1980s   Frozen shoulders bilaterally, no surgery, had physical therapy   Stress myoview  12/2003   Low risk   TONSILLECTOMY  Late 1970s   TUBAL LIGATION     VAGINAL PROLAPSE REPAIR N/A 09/22/2017   Procedure: VAGINAL VAULT SUSPENSION AND GRAFT;  Surgeon: Bjorn Loser, MD;  Location: [facility];  Service: Urology;  Laterality: N/A;   YAG LASER APPLICATION Bilateral [id number]   Family History  Problem Relation Age of Onset   Asthma Mother    Aortic aneurysm Mother    Stroke Father    Alzheimer's disease Father    Cancer Paternal Aunt        Breast Cancer   Breast cancer Paternal Aunt    Cancer Paternal Aunt        Breast Cancer   Breast cancer Paternal Aunt    Cancer Paternal Aunt        Breast Cancer   Breast cancer Paternal Aunt    Stroke Maternal Uncle    Hypertension Other        HTN on Father's side of family   Social History   Socioeconomic History   Marital status: Single    Spouse name: Not on file   Number of children: Not on file   Years of education: Not on file   Highest education level: Not on file  Occupational History   Not on file  Tobacco Use   Smoking status: Former    Types: Cigarettes    Quit date: 05/21/1998    Years since quitting: 23.8   Smokeless tobacco: Never  Vaping Use   Vaping Use: Never used  Substance and Sexual Activity   Alcohol use: No    Alcohol/week: 0.0 standard drinks of alcohol   Drug use: No   Sexual activity: Never     Birth control/protection: Post-menopausal  Other Topics Concern   Not on file  Social History Narrative   Not on file   Social Determinants of Health   Financial Resource Strain: Low Risk  (03/06/2022)   Overall Financial Resource Strain (CARDIA)    Difficulty of Paying Living Expenses: Not hard at all  Food Insecurity: No Food Insecurity (03/06/2022)   Hunger Vital Sign    Worried About Running Out of Food in  the Last Year: Never true    [place] in the Last Year: Never true  Transportation Needs: No Transportation Needs (03/06/2022)   PRAPARE - Hydrologist (Medical): No    Lack of Transportation (Non-Medical): No  Physical Activity: Insufficiently Active (03/06/2022)   Exercise Vital Sign    Days of Exercise per Week: 2 days    Minutes of Exercise per Session: 20 min  Stress: No Stress Concern Present (03/06/2022)   Cumings    Feeling of Stress : Not at all  Social Connections: Moderately Isolated (03/06/2022)   Social Connection and Isolation Panel [NHANES]    Frequency of Communication with Friends and Family: More than three times a week    Frequency of Social Gatherings with Friends and Family: Three times a week    Attends Religious Services: More than 4 times per year    Active Member of Clubs or Organizations: No    Attends Archivist Meetings: Never    Marital Status: Widowed    Tobacco Counseling Counseling given: Not Answered   Clinical Intake:  Pre-visit preparation completed: Yes  Pain : 0-10 Pain Score: 3  Pain Type: Acute pain Pain Location: Generalized (Pain keeps pt up at night) Pain Orientation: Right Pain Descriptors / Indicators: Sharp, Dull, Aching Pain Onset: Other (comment) (8-12 months ago) Pain Frequency: Constant     Nutritional Risks: None Diabetes: No  How often do you need to have someone help you when you read  instructions, pamphlets, or other written materials from your doctor or pharmacy?: 1 - Never  Diabetic? no  Interpreter Needed?: No  Information entered by :: C.Jamecia Lerman LPN   Activities of Daily Living    03/06/2022    9:59 AM  In your present state of health, do you have any difficulty performing the following activities:  Hearing? 0  Vision? 0  Difficulty concentrating or making decisions? 0  Walking or climbing stairs? 1  Comment Hip and knee pain  Dressing or bathing? 0  Doing errands, shopping? 0  Preparing Food and eating ? N  Using the Toilet? N  In the past six months, have you accidently leaked urine? Y  Comment Followed by Dr.McDermont ([facility])  Do you have problems with loss of bowel control? N  Managing your Medications? N  Managing your Finances? N  Housekeeping or managing your Housekeeping? N    Patient Care Team: Tower, Wynelle Fanny, MD as PCP - General Marygrace Drought, MD as Consulting Physician (Ophthalmology) Aloha Gell, MD as Consulting Physician (Obstetrics and Gynecology) Eula Listen, DDS as Referring Physician (Dentistry)  Indicate any recent Medical Services you may have received from other than [facility] providers in the past year (date may be approximate).     Assessment:   This is a routine wellness examination for [place].  Hearing/Vision screen Hearing Screening - Comments:: No aids Vision Screening - Comments:: Wears Glasses - Dr.Tanner  Dietary issues and exercise activities discussed: Current Exercise Habits: Home exercise routine, Type of exercise: walking, Time (Minutes): 20, Frequency (Times/Week): 2, Weekly Exercise (Minutes/Week): 40, Intensity: Mild, Exercise limited by: orthopedic condition(s) (Hip and knee pain)   Goals Addressed             This Visit's Progress    Patient Stated       Would like to start walking more.       Depression Screen  03/06/2022    9:54 AM 03/05/2021    9:55 AM 06/29/2019    9:02  AM 06/22/2018   10:06 AM 06/18/2017    8:38 AM 12/26/2015   10:18 AM 12/01/2014   10:24 AM  PHQ 2/9 Scores  PHQ - 2 Score 0 0 0 0 0 0 0  PHQ- 9 Score    0 0      Fall Risk    03/06/2022    9:59 AM 03/05/2021    9:54 AM 06/29/2019    9:01 AM 06/22/2018   10:06 AM 06/18/2017    8:38 AM  Fall Risk   Falls in the past year? 0 0 0 0 No  Number falls in past yr: 0 0     Injury with Fall? 0 0     Risk for fall due to : No Fall Risks No Fall Risks     Follow up Falls prevention discussed;Falls evaluation completed Falls prevention discussed       FALL RISK PREVENTION PERTAINING TO THE HOME:  Any stairs in or around the home? Yes  If so, are there any without handrails? Yes  Home free of loose throw rugs in walkways, pet beds, electrical cords, etc?  Has rugs at entryway. Adequate lighting in your home to reduce risk of falls? Yes   ASSISTIVE DEVICES UTILIZED TO PREVENT FALLS:  Life alert? Yes  Use of a cane, walker or w/c? No  Grab bars in the bathroom? Yes  Shower chair or bench in shower? Yes  Elevated toilet seat or a handicapped toilet? Yes   Cognitive Function:    06/22/2018   10:32 AM 06/18/2017    8:39 AM 12/26/2015   10:38 AM  MMSE - Mini Mental State Exam  Orientation to time 5 5 5  $ Orientation to Place 5 5 5  $ Registration 3 3 3  $ Attention/ Calculation 0 0 0  Recall 3 3 3  $ Language- name 2 objects 0 0 0  Language- repeat 1 1 1  $ Language- follow 3 step command 0 3 3  Language- read & follow direction 0 0 0  Write a sentence 0 0 0  Copy design 0 0 0  Total score 17 20 20        $ 03/06/2022   10:09 AM  6CIT Screen  What Year? 0 points  What month? 0 points  What time? 0 points  Count back from 20 0 points  Months in reverse 0 points  Repeat phrase 2 points  Total Score 2 points    Immunizations Immunization History  Administered Date(s) Administered   Influenza,inj,Quad PF,6+ Mos 11/18/2012   PFIZER(Purple Top)SARS-COV-2 Vaccination 03/18/2019, 04/12/2019,  01/17/2020   Td 06/29/2012, 01/23/2022    TDAP status: Up to date  Flu Vaccine status: Declined, Education has been provided regarding the importance of this vaccine but patient still declined. Advised may receive this vaccine at local pharmacy or Health Dept. Aware to provide a copy of the vaccination record if obtained from local pharmacy or Health Dept. Verbalized acceptance and understanding.  Pneumococcal vaccine status: Declined,  Education has been provided regarding the importance of this vaccine but patient still declined. Advised may receive this vaccine at local pharmacy or Health Dept. Aware to provide a copy of the vaccination record if obtained from local pharmacy or Health Dept. Verbalized acceptance and understanding.   Covid-19 vaccine status: Declined, Education has been provided regarding the importance of this vaccine but patient still declined. Advised may receive  this vaccine at local pharmacy or Health Dept.or vaccine clinic. Aware to provide a copy of the vaccination record if obtained from local pharmacy or Health Dept. Verbalized acceptance and understanding.  Qualifies for Shingles Vaccine? Yes   Zostavax completed No   Shingrix Completed?: No.    Education has been provided regarding the importance of this vaccine. Patient has been advised to call insurance company to determine out of pocket expense if they have not yet received this vaccine. Advised may also receive vaccine at local pharmacy or Health Dept. Verbalized acceptance and understanding.  Screening Tests Health Maintenance  Topic Date Due   Zoster Vaccines- Shingrix (1 of 2) Never done   Pneumonia Vaccine 38+ Years old (1 of 1 - PCV) Never done   COVID-19 Vaccine (4 - 2023-24 season) 09/20/2021   INFLUENZA VACCINE  04/20/2022 (Originally 08/20/2021)   MAMMOGRAM  01/30/2023   Medicare Annual Wellness (AWV)  03/07/2023   DTaP/Tdap/Td (3 - Tdap) 01/24/2032   DEXA SCAN  Completed   Hepatitis C Screening   Completed   HPV VACCINES  Aged Out   COLONOSCOPY (Pts 45-77yr Insurance coverage will need to be confirmed)  Discontinued    Health Maintenance  Health Maintenance Due  Topic Date Due   Zoster Vaccines- Shingrix (1 of 2) Never done   Pneumonia Vaccine 78 Years old (1 of 1 - PCV) Never done   COVID-19 Vaccine (4 - 2023-24 season) 09/20/2021    Colorectal cancer screening: No longer required.   Mammogram status: Completed 01/29/2022. Repeat every year  Bone Density status: Completed 02/02/2015. Results reflect: Bone density results: OSTEOPENIA. Repeat every 2 years. Pt Declined.  Lung Cancer Screening: (Low Dose CT Chest recommended if Age 363-80years, 30 pack-year currently smoking OR have quit w/in 15years.) does not qualify.   Lung Cancer Screening Referral: no  Additional Screening:  Hepatitis C Screening: does not qualify; Completed 01/05/2016  Vision Screening: Recommended annual ophthalmology exams for early detection of glaucoma and other disorders of the eye. Is the patient up to date with their annual eye exam?  Yes  Who is the provider or what is the name of the office in which the patient attends annual eye exams? Dr.Tanner If pt is not established with a provider, would they like to be referred to a provider to establish care? No .   Dental Screening: Recommended annual dental exams for proper oral hygiene  Community Resource Referral / Chronic Care Management: CRR required this visit?  No   CCM required this visit?  No      Plan:     I have personally reviewed and noted the following in the patient's chart:   Medical and social history Use of alcohol, tobacco or illicit drugs  Current medications and supplements including opioid prescriptions. Patient is not currently taking opioid prescriptions. Functional ability and status Nutritional status Physical activity Advanced directives List of other physicians Hospitalizations, surgeries, and ER visits  in previous 12 months Vitals Screenings to include cognitive, depression, and falls Referrals and appointments  In addition, I have reviewed and discussed with patient certain preventive protocols, quality metrics, and best practice recommendations. A written personalized care plan for preventive services as well as general preventive health recommendations were provided to patient.     CLebron Conners LPN   [id number]  Nurse Notes: Pt declined all immunizations, dexa scan, and referral to community services.

## 2022-03-19 ENCOUNTER — Ambulatory Visit (INDEPENDENT_AMBULATORY_CARE_PROVIDER_SITE_OTHER)
Admission: RE | Admit: 2022-03-19 | Discharge: 2022-03-19 | Disposition: A | Discharge status: Home / Self Care | Payer: Medicare Other | Source: Ambulatory Visit | Attending: Family Medicine | Admitting: Family Medicine

## 2022-03-19 ENCOUNTER — Encounter: Payer: Self-pay | Admitting: Family Medicine

## 2022-03-19 ENCOUNTER — Ambulatory Visit (INDEPENDENT_AMBULATORY_CARE_PROVIDER_SITE_OTHER): Payer: Medicare Other | Admitting: Family Medicine

## 2022-03-19 VITALS — BP 133/68 | HR 81 | Temp 97.6°F | Ht 63.0 in | Wt 139.0 lb

## 2022-03-19 DIAGNOSIS — K121 Other forms of stomatitis: Secondary | ICD-10-CM | POA: Diagnosis not present

## 2022-03-19 DIAGNOSIS — M25551 Pain in right hip: Secondary | ICD-10-CM

## 2022-03-19 DIAGNOSIS — M160 Bilateral primary osteoarthritis of hip: Secondary | ICD-10-CM | POA: Diagnosis not present

## 2022-03-19 DIAGNOSIS — M25512 Pain in left shoulder: Secondary | ICD-10-CM

## 2022-03-19 DIAGNOSIS — G479 Sleep disorder, unspecified: Secondary | ICD-10-CM | POA: Insufficient documentation

## 2022-03-19 MED ORDER — MELOXICAM 15 MG PO TABS: 7.5000 mg | ORAL_TABLET | Freq: Every day | ORAL | 0 refills | Status: DC | PRN

## 2022-03-19 NOTE — Progress Notes (Signed)
Subjective:    Patient ID: Gina Williams, female    DOB: 01-09-1946, 77 y.o.   MRN: [id number]  HPI Pt presents with chronic pain/ not sleeping    Wt Readings from Last 3 Encounters:  03/19/22 139 lb (63 kg)  03/06/22 138 lb (62.6 kg)  01/23/22 138 lb 6 oz (62.8 kg)   24.62 kg/m Not sleeping due to pain     Pain in right hip - this is new  Hurt in outer hip Hurts to lie on  Very bothersome  Does not hurt to sit Occ bothers her to walk  Has never been imaged   Past h/o knee problems    No regular exercise this week Usually walks - up to 1/2 mile   No trauma  No falls     XR DG HIP UNILAT WITH PELVIS 2-3 VIEWS RIGHT  Result Date: 03/19/2022 CLINICAL DATA:  Right hip pain. EXAM: DG HIP (WITH OR WITHOUT PELVIS) 2-3V RIGHT COMPARISON:  None Available. FINDINGS: Both hips are normally located. Mild degenerative changes bilaterally. No fracture, bone lesion or AVN. The pubic symphysis and SI joints are intact. No pelvic fractures or bone lesions. IMPRESSION: 1. Mild bilateral hip joint degenerative changes. 2. No acute bony findings. Electronically Signed   By: Marijo Sanes M.D.   On: 03/19/2022 12:29        Pain in left shoulder - is radicular pain from neck/ makes her fingers numb  Dr Ernestina Patches is planning an injection in neck  Unsure when    Poss stomatitis ? roof of mouth    Otc Aleve helps but it bothers her stomach Does also take nexium  Takes 2 tylenol at bedtime   Occ laxative for constipation   Used melatonin for sleep-does not help much   If she gets up to urinate cannot go back to sleep  Has not taken benadryl in the past    BP Readings from Last 3 Encounters:  03/19/22 (!) 144/74  01/23/22 (!) 142/76  11/29/21 116/64   Lab Results  Component Value Date   CREATININE 0.88 03/19/2021   BUN 20 03/19/2021   NA 140 03/19/2021   K 4.1 03/19/2021   CL 106 03/19/2021   CO2 23 03/19/2021   Lab Results  Component Value Date   ALT  12 03/19/2021   AST 14 03/19/2021   ALKPHOS 64 03/19/2021   BILITOT 0.4 03/19/2021    Mouth ulcer  Under denture plate  Improving   Only one   Patient Active Problem List   Diagnosis Date Noted   Right hip pain 03/19/2022   Sleep disorder 03/19/2022   Mouth ulcer 03/19/2022   Wound, open, leg, left, initial encounter 01/23/2022   Fatigue 06/21/2019   Parent refuses immunizations 06/29/2018   Elevated glucose level 06/20/2018   Cystocele with prolapse 09/22/2017   Left shoulder pain 06/25/2017   S/P hysterectomy 10/23/2016   Atherosclerosis of aorta ([place]) 09/23/2016   Routine general medical examination at a health care facility 12/01/2014   Estrogen deficiency 12/01/2014   Osteopenia 11/28/2013   Colon cancer screening 11/03/2012   Hyperlipidemia 11/03/2012   Encounter for Medicare annual wellness exam 10/26/2012   GERD 01/23/2010   History of breast cancer 01/23/2010   Past Medical History:  Diagnosis Date   Arthritis    hands   Breast cancer ([place]) 09/16/2007   Mastectomy   Dyspnea    last 5 months, lack of conditioning   GERD (gastroesophageal reflux disease)  Hypoglycemic reaction    fainted in her youth , hadnt eaten anything for hours and was assisting with a 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 BMD   Personal history of chemotherapy    PONV (postoperative nausea and vomiting)    SEVERE, patient is very concerned about this issue occurred with mastectomy sugery ; no issues whn she had hysterectomy    Vertigo 2007   Past Surgical History:  Procedure Laterality Date   ANTERIOR AND POSTERIOR REPAIR N/A 09/22/2017   Procedure: CYSTOSCOPY ANTERIOR (CYSTOCELE);  Surgeon: Bjorn Loser, MD;  Location: [facility];  Service: Urology;  Laterality: N/A;   BASAL CELL CARCINOMA EXCISION N/A 12/03/2020   CATARACT EXTRACTION, BILATERAL     COLONOSCOPY     CYSTOSCOPY N/A 10/23/2016   Procedure:  CYSTOSCOPY;  Surgeon: Aloha Gell, MD;  Location: [facility];  Service: Gynecology;  Laterality: N/A;   GANGLION CYST EXCISION Left 1970s   Multiple surgeries   HAND SURGERY Left 10/2005   Nerve damage   HAND SURGERY Left 2011   Torn ligament surgery   MASTECTOMY  08/2007   Breast Cancer   Nuclear Stress Test     Negative  EF 77%   pap  08/05/2014   [facility]   pessary  12/19/2015   [facility] Dr. Pamala Hurry   ROBOTIC ASSISTED TOTAL HYSTERECTOMY WITH BILATERAL SALPINGO OOPHERECTOMY Bilateral 10/23/2016   Procedure: ROBOTIC ASSISTED TOTAL HYSTERECTOMY WITH BILATERAL SALPINGO OOPHORECTOMY/Uterosacral Ligament Suspension;  Surgeon: Aloha Gell, MD;  Location: [facility];  Service: Gynecology;  Laterality: Bilateral;   SHOULDER SURGERY Bilateral Late 1980s   Frozen shoulders bilaterally, no surgery, had physical therapy   Stress myoview  12/2003   Low risk   TONSILLECTOMY  Late 1970s   TUBAL LIGATION     VAGINAL PROLAPSE REPAIR N/A 09/22/2017   Procedure: VAGINAL VAULT SUSPENSION AND GRAFT;  Surgeon: Bjorn Loser, MD;  Location: [facility];  Service: Urology;  Laterality: N/A;   YAG LASER APPLICATION Bilateral [id number]   Social History   Tobacco Use   Smoking status: Former    Types: Cigarettes    Quit date: 05/21/1998    Years since quitting: 23.8   Smokeless tobacco: Never  Vaping Use   Vaping Use: Never used  Substance Use Topics   Alcohol use: No    Alcohol/week: 0.0 standard drinks of alcohol   Drug use: No   Family History  Problem Relation Age of Onset   Asthma Mother    Aortic aneurysm Mother    Stroke Father    Alzheimer's disease Father    Cancer Paternal Aunt        Breast Cancer   Breast cancer Paternal Aunt    Cancer Paternal Aunt        Breast Cancer   Breast cancer Paternal Aunt    Cancer Paternal Aunt        Breast Cancer   Breast cancer Paternal Aunt    Stroke Maternal Uncle    Hypertension Other        HTN on Father's side of family    Allergies  Allergen Reactions   Amoxicillin-Pot Clavulanate Nausea Only and Other (See Comments)    REACTION: nausea (tolerates amoxil) Has patient had a PCN reaction causing immediate rash, facial/tongue/throat swelling, SOB or lightheadedness with hypotension: No Has patient had a PCN reaction causing severe rash involving mucus  membranes or skin necrosis: Unknown Has patient had a PCN reaction that required hospitalization: No Has patient had a PCN reaction occurring within the last 10 years: No If all of the above answers are "NO", then may proceed with Cephalosporin use.    Omeprazole Other (See Comments)    ineffective    Other Nausea And Vomiting and Other (See Comments)    Anesthesia-vomiting/dry heaves (up to ~12hrs after)   Current Outpatient Medications on File Prior to Visit  Medication Sig Dispense Refill   acetaminophen (TYLENOL) 500 MG tablet Take 500 mg by mouth every 6 (six) hours as needed for moderate pain or headache.      Cholecalciferol (VITAMIN D3 PO) Take 2,000 Units by mouth daily.      diphenhydrAMINE (BENADRYL) 25 mg capsule Take 25 mg by mouth at bedtime.     esomeprazole (NEXIUM) 40 MG capsule Take 1 capsule (40 mg total) by mouth daily. 90 capsule 3   fluticasone (FLONASE) 50 MCG/ACT nasal spray USE 2 SPRAY(S) IN EACH NOSTRIL ONCE DAILY AS NEEDED FOR  ALLERGIES  OR  RHINITIS 48 g 1   melatonin 5 MG TABS Take 5 mg by mouth at bedtime as needed.     mupirocin ointment (BACTROBAN) 2 % Apply 1 Application topically 2 (two) times daily. To affected area on leg 15 g 0   Omega-3 Fatty Acids (FISH OIL PO) Take 1,900 mg by mouth daily.     Polyethyl Glycol-Propyl Glycol (SYSTANE) 0.4-0.3 % SOLN Apply to eye.     No current facility-administered medications on file prior to visit.     Review of Systems  Constitutional:  Positive for fatigue and fever. Negative for activity change, appetite change and unexpected weight change.  HENT:  Positive for mouth sores.  Negative for congestion, ear pain, rhinorrhea, sinus pressure and sore throat.   Eyes:  Negative for pain, redness and visual disturbance.  Respiratory:  Negative for cough, shortness of breath and wheezing.   Cardiovascular:  Negative for chest pain and palpitations.  Gastrointestinal:  Negative for abdominal pain, blood in stool, constipation and diarrhea.  Endocrine: Negative for polydipsia and polyuria.  Genitourinary:  Negative for dysuria, frequency and urgency.  Musculoskeletal:  Positive for neck pain. Negative for arthralgias, back pain and myalgias.  Skin:  Negative for pallor and rash.  Allergic/Immunologic: Negative for environmental allergies.  Neurological:  Negative for dizziness, syncope and headaches.  Hematological:  Negative for adenopathy. Does not bruise/bleed easily.  Psychiatric/Behavioral:  Negative for decreased concentration and dysphoric mood. The patient is not nervous/anxious.        Objective:   Physical Exam Constitutional:      General: She is not in acute distress.    Appearance: Normal appearance. She is normal weight. She is not ill-appearing or diaphoretic.  HENT:     Head: Normocephalic and atraumatic.     Nose: Nose normal.     Mouth/Throat:     Mouth: Mucous membranes are moist.     Pharynx: No oropharyngeal exudate or posterior oropharyngeal erythema.     Comments: 2-3 mm white based ulcer on mid hard palate under denture Slt surrounding erythema and no swelling No other lesions  Cardiovascular:     Rate and Rhythm: Normal rate and regular rhythm.     Heart sounds: Normal heart sounds.  Pulmonary:     Effort: Pulmonary effort is normal. No respiratory distress.     Breath sounds: Normal breath sounds. No wheezing or rales.  Musculoskeletal:     Cervical back: Neck supple. No rigidity.     Right hip: Tenderness and bony tenderness present. No deformity, lacerations or crepitus. Normal range of motion. Normal strength.     Comments: Limited  rom of L shoulder   R hip Some mild tenderness around greater trochanter  No swelling or skin change No groin tenderness or swelling   Pain with full ext rotation  Some pain with IT stretch and full hip flexion     Lymphadenopathy:     Cervical: No cervical adenopathy.  Skin:    General: Skin is warm and dry.     Coloration: Skin is not pale.     Findings: No bruising, erythema or rash.  Neurological:     Mental Status: She is alert.     Sensory: No sensory deficit.     Motor: No weakness.     Coordination: Coordination normal.     Gait: Gait normal.     Deep Tendon Reflexes: Reflexes normal.  Psychiatric:        Mood and Affect: Mood normal.           Assessment & Plan:   Problem List Items Addressed This Visit       Digestive   Mouth ulcer    Pt has a solitary ulcer under denture plate top of hard palate  Suspect traumatic Already getting better  Adv use of salt water swish Break from denture if needed Ambesol prn  Update if not starting to improve in a week or if worsening          Other   Left shoulder pain    Per pt is radicular  Affects sleep Planning inj in her neck from Dr Ernestina Patches soon        Right hip pain - Primary    Based on exam, suspect trochanteric bursitis  Fair rom/ reassuring exam Xr to r/o OA today - some bilateral hip mild deg changes   Plan to try meloxican to see if tolerated better than aleve  Ice prn  Stretches   Will likely need f/u with ortho (she prefers this to sport med) depending on result  If bursitis-injection may help in future       Relevant Orders   DG HIP UNILAT WITH PELVIS 2-3 VIEWS RIGHT (Completed)   Sleep disorder    Suspect primarily due to pain in hip and shoulder  Working on those problems  Trial of meloxicam and planning CS injection   If sleep problems continue then- may consider trazodone  Has used melatonin and benadryl in past-not very helpful   Enc good sleep hygiene

## 2022-03-19 NOTE — Patient Instructions (Addendum)
Salt water swish and ambesol for the mouth ulcer  Let us know if it does not get better   Xray of hip today  We will call with result  You may have bursitis   Try meloxicam instead of aleve , if it bothers stomach stop it and let us know  If that is not tolerated you can use diclofenac gel over the counter  Tylenol is ok also   Try ice on the painful area 10 minutes at a time also

## 2022-03-19 NOTE — Assessment & Plan Note (Signed)
Pt has a solitary ulcer under denture plate top of hard palate  Suspect traumatic Already getting better  Adv use of salt water swish Break from denture if needed Ambesol prn  Update if not starting to improve in a week or if worsening

## 2022-03-19 NOTE — Assessment & Plan Note (Addendum)
Based on exam, suspect trochanteric bursitis  Fair rom/ reassuring exam Xr to r/o OA today - some bilateral hip mild deg changes   Plan to try meloxican to see if tolerated better than aleve  Ice prn  Stretches   Will likely need f/u with ortho (she prefers this to sport med) depending on result  If bursitis-injection may help in future

## 2022-03-19 NOTE — Assessment & Plan Note (Signed)
Suspect primarily due to pain in hip and shoulder  Working on those problems  Trial of meloxicam and planning CS injection   If sleep problems continue then- may consider trazodone  Has used melatonin and benadryl in past-not very helpful   Enc good sleep hygiene

## 2022-03-19 NOTE — Assessment & Plan Note (Signed)
Per pt is radicular  Affects sleep Planning inj in her neck from Dr Ernestina Patches soon

## 2022-03-21 ENCOUNTER — Telehealth: Payer: Self-pay | Admitting: *Deleted

## 2022-03-21 ENCOUNTER — Telehealth: Payer: Self-pay | Admitting: Physical Medicine and Rehabilitation

## 2022-03-21 ENCOUNTER — Other Ambulatory Visit (HOSPITAL_BASED_OUTPATIENT_CLINIC_OR_DEPARTMENT_OTHER): Payer: Self-pay

## 2022-03-21 DIAGNOSIS — E7800 Pure hypercholesterolemia, unspecified: Secondary | ICD-10-CM

## 2022-03-21 DIAGNOSIS — R5382 Chronic fatigue, unspecified: Secondary | ICD-10-CM

## 2022-03-21 DIAGNOSIS — R7309 Other abnormal glucose: Secondary | ICD-10-CM

## 2022-03-21 NOTE — Telephone Encounter (Signed)
-----   Message from Velna Hatchet, RT sent at 03/11/2022 10:51 AM EST ----- Regarding: Wed 3/6 lab Patient is scheduled for cpx, please order future labs.  Thanks, Anda Kraft

## 2022-03-21 NOTE — Telephone Encounter (Signed)
Patient requesting an appointment with Dr. Ernestina Patches

## 2022-03-21 NOTE — Telephone Encounter (Signed)
Spoke with patient and scheduled OV for 03/25/22

## 2022-03-25 ENCOUNTER — Ambulatory Visit (INDEPENDENT_AMBULATORY_CARE_PROVIDER_SITE_OTHER): Payer: Medicare Other | Admitting: Physical Medicine and Rehabilitation

## 2022-03-25 ENCOUNTER — Telehealth: Payer: Self-pay | Admitting: Physical Medicine and Rehabilitation

## 2022-03-25 ENCOUNTER — Encounter: Payer: Self-pay | Admitting: Physical Medicine and Rehabilitation

## 2022-03-25 DIAGNOSIS — M79604 Pain in right leg: Secondary | ICD-10-CM | POA: Diagnosis not present

## 2022-03-25 DIAGNOSIS — M25551 Pain in right hip: Secondary | ICD-10-CM | POA: Diagnosis not present

## 2022-03-25 DIAGNOSIS — R202 Paresthesia of skin: Secondary | ICD-10-CM

## 2022-03-25 DIAGNOSIS — M7061 Trochanteric bursitis, right hip: Secondary | ICD-10-CM

## 2022-03-25 NOTE — Telephone Encounter (Signed)
Patient states she received an injection today and advised that she forgot to as if she could take her medication. Please advise

## 2022-03-25 NOTE — Progress Notes (Signed)
Functional Pain Scale - descriptive words and definitions  Intense (8)    Cannot complete any ADLs without much assistance/cannot concentrate/conversation is difficult/unable to sleep and unable to use distraction. Severe range order  Average Pain 9  Having left shoulder irritation and left fingers are numb. Fingers 1-4.  No pain.  Right hip pain for 1 year.  Last 4 months she hasn't ben able to sleep. Pain is 1/2 during the day and 9/10 at night.

## 2022-03-25 NOTE — Progress Notes (Signed)
Aryiana Degler [place] - 77 y.o. female MRN [id number]  Date of birth: Jan 18, 1946  Office Visit Note: Visit Date: 03/25/2022 PCP: Abner Greenspan, MD Referred by: Tower, Wynelle Fanny, MD  Subjective: Chief Complaint  Patient presents with   Neck - Pain   Left Shoulder - Pain   HPI: Lylla Melnikov is a 77 y.o. female who comes in today for evaluation of chronic, worsening and severe pain to right lateral hip, intermittent radiation to right groin and down anterior leg. Also reports intermittent issues with bilateral lower back pain. She was last seen in our office in 2019. Pain ongoing for several months, worsens with sleeping, especially on right side. She describes pain as dull aching sensation, currently rates as 8 out of 10. Some relief of pain with home exercise regimen, rest and use of medications. Currently taking Mobic. Recent bilateral hip x rays exhibit mild degenerative changes. She was recently seen by her primary care provider Dr. Loura Pardon, per her notes symptoms indicative of right greater trochanter bursitis. No history of lumbar MRI imaging. Patient denies focal weakness. No recent trauma or falls.   She also reports chronic left sided neck pain radiating to arm and paraesthesias to left hand/fingers. Symptoms ongoing for several years. Previous left C3-C4 facet joint injection in our office on 09/14/2017. Significant relief of pain with this procedure. States this is not a severe issue for her at this time, right lateral hip pain is most concerning.    Review of Systems  Musculoskeletal:  Positive for back pain and myalgias.  Neurological:  Positive for tingling. Negative for sensory change, focal weakness and weakness.  All other systems reviewed and are negative.  Otherwise per HPI.  Assessment & Plan: Visit Diagnoses:    ICD-10-CM   1. Greater trochanteric bursitis, right  M70.61 Large Joint Inj: R greater trochanter    2. Pain in right hip  M25.551 Large Joint Inj:  R greater trochanter    3. Pain in right leg  M79.604 Large Joint Inj: R greater trochanter    4. Paresthesia of skin  R20.2 Large Joint Inj: R greater trochanter       Plan: Findings:  1. Chronic, worsening and severe right sided lateral hip pain, intermittent radiation to right groin and down anterior leg.  Patient continues to have severe pain despite good conservative therapies such as home exercise regimen, rest and use of medications.  Patient's clinical presentation and exam are consistent with greater trochanteric bursitis.  Tenderness noted upon palpation of right greater trochanter on exam today.  I did perform right greater trochanter injection in the office today, she tolerated without difficulty.  She does report significant relief of pain during anesthetic phase. If sustained relief of pain we can repeat this injection infrequently as needed. If her pain persists we would consider obtaining lumbar MRI imaging as her symptoms could be indicative of lumbar radiculopathy. Patient encouraged to remain active as tolerated. She will let us know how she is feeling. No red flag symptoms noted upon exam today.   2. Chronic left sided neck pain radiating to arm and paraesthesias to left hand/fingers. If symptoms persist or worsen we did discuss repeating cervical facet injection. This does not seem to be her biggest concern at this time, would like to focus on right hip pain. I did instruct her we can always revisit neck issues when needed.     Meds & Orders: No orders of the defined types were placed  in this encounter.   Orders Placed This Encounter  Procedures   Large Joint Inj: R greater trochanter    Follow-up: Return if symptoms worsen or fail to improve.   Procedures: Large Joint Inj: R greater trochanter on 03/25/2022 9:49 AM Indications: pain and diagnostic evaluation Details: 25 G 1.5 in needle, lateral approach Medications: 5 mL lidocaine 1 %; 40 mg triamcinolone acetonide 40  MG/ML Outcome: tolerated well, no immediate complications Consent was given by the patient. Immediately prior to procedure a time out was called to verify the correct patient, procedure, equipment, support staff and site/side marked as required. Patient was prepped and draped in the usual sterile fashion.          Clinical History: No specialty comments available.   She reports that she quit smoking about 23 years ago. Her smoking use included cigarettes. She has never used smokeless tobacco. No results for input(s): "HGBA1C", "LABURIC" in the last 8760 hours.  Objective:  VS:  HT:    WT:   BMI:     BP:   HR: bpm  TEMP: ( )  RESP:  Physical Exam Vitals and nursing note reviewed.  HENT:     Head: Normocephalic and atraumatic.     Right Ear: External ear normal.     Left Ear: External ear normal.     Nose: Nose normal.     Mouth/Throat:     Mouth: Mucous membranes are moist.  Eyes:     Extraocular Movements: Extraocular movements intact.  Cardiovascular:     Rate and Rhythm: Normal rate.     Pulses: Normal pulses.  Pulmonary:     Effort: Pulmonary effort is normal.  Abdominal:     General: Abdomen is flat. There is no distension.  Musculoskeletal:        General: Tenderness present.     Cervical back: Normal range of motion.     Comments: Patient rises from seated position to standing without difficulty. Good lumbar range of motion. No pain noted with facet loading. 5/5 strength noted with bilateral hip flexion, knee flexion/extension, ankle dorsiflexion/plantarflexion and EHL. No clonus noted bilaterally. Pain upon palpation of right greater trochanter. No pain with internal/external rotation of bilateral hips. Sensation intact bilaterally. Negative slump test bilaterally. Ambulates without aid, gait steady.     Skin:    General: Skin is warm and dry.     Capillary Refill: Capillary refill takes less than 2 seconds.  Neurological:     General: No focal deficit present.      Mental Status: She is alert and oriented to person, place, and time.  Psychiatric:        Mood and Affect: Mood normal.        Behavior: Behavior normal.     Ortho Exam  Imaging: No results found.  Past Medical/Family/Surgical/Social History: Medications & Allergies reviewed per EMR, new medications updated. Patient Active Problem List   Diagnosis Date Noted   Right hip pain 03/19/2022   Sleep disorder 03/19/2022   Mouth ulcer 03/19/2022   Wound, open, leg, left, initial encounter 01/23/2022   Fatigue 06/21/2019   Parent refuses immunizations 06/29/2018   Elevated glucose level 06/20/2018   Cystocele with prolapse 09/22/2017   Left shoulder pain 06/25/2017   S/P hysterectomy 10/23/2016   Atherosclerosis of aorta ([place]) 09/23/2016   Routine general medical examination at a health care facility 12/01/2014   Estrogen deficiency 12/01/2014   Osteopenia 11/28/2013   Colon cancer screening 11/03/2012  Hyperlipidemia 11/03/2012   Encounter for Medicare annual wellness exam 10/26/2012   GERD 01/23/2010   History of breast cancer 01/23/2010   Past Medical History:  Diagnosis Date   Arthritis    hands   Breast cancer ([place]) 09/16/2007   Mastectomy   Dyspnea    last 5 months, lack of conditioning   GERD (gastroesophageal reflux disease)    Hypoglycemic reaction    fainted in her youth , hadnt eaten anything for hours and was assisting with a 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 BMD   Personal history of chemotherapy    PONV (postoperative nausea and vomiting)    SEVERE, patient is very concerned about this issue occurred with mastectomy sugery ; no issues whn she had hysterectomy    Vertigo 2007   Family History  Problem Relation Age of Onset   Asthma Mother    Aortic aneurysm Mother    Stroke Father    Alzheimer's disease Father    Cancer Paternal Aunt        Breast Cancer   Breast cancer  Paternal Aunt    Cancer Paternal Aunt        Breast Cancer   Breast cancer Paternal Aunt    Cancer Paternal Aunt        Breast Cancer   Breast cancer Paternal Aunt    Stroke Maternal Uncle    Hypertension Other        HTN on Father's side of family   Past Surgical History:  Procedure Laterality Date   ANTERIOR AND POSTERIOR REPAIR N/A 09/22/2017   Procedure: CYSTOSCOPY ANTERIOR (CYSTOCELE);  Surgeon: Bjorn Loser, MD;  Location: [facility];  Service: Urology;  Laterality: N/A;   BASAL CELL CARCINOMA EXCISION N/A 12/03/2020   CATARACT EXTRACTION, BILATERAL     COLONOSCOPY     CYSTOSCOPY N/A 10/23/2016   Procedure: CYSTOSCOPY;  Surgeon: Aloha Gell, MD;  Location: [facility];  Service: Gynecology;  Laterality: N/A;   GANGLION CYST EXCISION Left 1970s   Multiple surgeries   HAND SURGERY Left 10/2005   Nerve damage   HAND SURGERY Left 2011   Torn ligament surgery   MASTECTOMY  08/2007   Breast Cancer   Nuclear Stress Test     Negative  EF 77%   pap  08/05/2014   [facility]   pessary  12/19/2015   [facility] Dr. Pamala Hurry   ROBOTIC ASSISTED TOTAL HYSTERECTOMY WITH BILATERAL SALPINGO OOPHERECTOMY Bilateral 10/23/2016   Procedure: ROBOTIC ASSISTED TOTAL HYSTERECTOMY WITH BILATERAL SALPINGO OOPHORECTOMY/Uterosacral Ligament Suspension;  Surgeon: Aloha Gell, MD;  Location: [facility];  Service: Gynecology;  Laterality: Bilateral;   SHOULDER SURGERY Bilateral Late 1980s   Frozen shoulders bilaterally, no surgery, had physical therapy   Stress myoview  12/2003   Low risk   TONSILLECTOMY  Late 1970s   TUBAL LIGATION     VAGINAL PROLAPSE REPAIR N/A 09/22/2017   Procedure: VAGINAL VAULT SUSPENSION AND GRAFT;  Surgeon: Bjorn Loser, MD;  Location: [facility];  Service: Urology;  Laterality: N/A;   YAG LASER APPLICATION Bilateral [id number]   Social History   Occupational History   Not on file  Tobacco Use   Smoking status: Former    Types: Cigarettes    Quit date:  05/21/1998    Years since quitting: 23.8   Smokeless tobacco: Never  Vaping Use   Vaping Use: Never  used  Substance and Sexual Activity   Alcohol use: No    Alcohol/week: 0.0 standard drinks of alcohol   Drug use: No   Sexual activity: Never    Birth control/protection: Post-menopausal

## 2022-03-25 NOTE — Telephone Encounter (Signed)
Spoke with patient and informed her that it is okay to take regular medications after injection

## 2022-03-26 ENCOUNTER — Other Ambulatory Visit (INDEPENDENT_AMBULATORY_CARE_PROVIDER_SITE_OTHER): Payer: Medicare Other

## 2022-03-26 DIAGNOSIS — E7800 Pure hypercholesterolemia, unspecified: Secondary | ICD-10-CM

## 2022-03-26 DIAGNOSIS — R7309 Other abnormal glucose: Secondary | ICD-10-CM | POA: Diagnosis not present

## 2022-03-26 DIAGNOSIS — R5382 Chronic fatigue, unspecified: Secondary | ICD-10-CM

## 2022-03-26 LAB — COMPREHENSIVE METABOLIC PANEL
ALT: 10 U/L (ref 0–35)
AST: 13 U/L (ref 0–37)
Albumin: 3.9 g/dL (ref 3.5–5.2)
Alkaline Phosphatase: 58 U/L (ref 39–117)
BUN: 20 mg/dL (ref 6–23)
CO2: 25 mEq/L (ref 19–32)
Calcium: 9.6 mg/dL (ref 8.4–10.5)
Chloride: 107 mEq/L (ref 96–112)
Creatinine, Ser: 0.85 mg/dL (ref 0.40–1.20)
GFR: 66.11 mL/min (ref >60.00)
Glucose, Bld: 96 mg/dL (ref 70–99)
Potassium: 4.4 mEq/L (ref 3.5–5.1)
Sodium: 141 mEq/L (ref 135–145)
Total Bilirubin: 0.4 mg/dL (ref 0.2–1.2)
Total Protein: 6.8 g/dL (ref 6.0–8.3)

## 2022-03-26 LAB — CBC WITH DIFFERENTIAL/PLATELET
Basophils Absolute: 0.1 10*3/uL (ref 0.0–0.1)
Basophils Relative: 0.8 % (ref 0.0–3.0)
Eosinophils Absolute: 0 10*3/uL (ref 0.0–0.7)
Eosinophils Relative: 0.1 % (ref 0.0–5.0)
HCT: 41.5 % (ref 36.0–46.0)
Hemoglobin: 13.8 g/dL (ref 12.0–15.0)
Lymphocytes Relative: 15.5 % (ref 12.0–46.0)
Lymphs Abs: 1.4 10*3/uL (ref 0.7–4.0)
MCHC: 33.3 g/dL (ref 30.0–36.0)
MCV: 89.1 fl (ref 78.0–100.0)
Monocytes Absolute: 0.5 10*3/uL (ref 0.1–1.0)
Monocytes Relative: 5.8 % (ref 3.0–12.0)
Neutro Abs: 7.1 10*3/uL (ref 1.4–7.7)
Neutrophils Relative %: 77.8 % — ABNORMAL HIGH (ref 43.0–77.0)
Platelets: 331 10*3/uL (ref 150.0–400.0)
RBC: 4.66 Mil/uL (ref 3.87–5.11)
RDW: 13 % (ref 11.5–15.5)
WBC: 9.2 10*3/uL (ref 4.0–10.5)

## 2022-03-26 LAB — TSH: TSH: 0.68 u[IU]/mL (ref 0.35–5.50)

## 2022-03-26 LAB — LIPID PANEL
Cholesterol: 189 mg/dL (ref 0–200)
HDL: 56.1 mg/dL (ref >39.00)
LDL Cholesterol: 119 mg/dL — ABNORMAL HIGH (ref 0–99)
NonHDL: 132.55
Total CHOL/HDL Ratio: 3
Triglycerides: 67 mg/dL (ref 0.0–149.0)
VLDL: 13.4 mg/dL (ref 0.0–40.0)

## 2022-03-26 LAB — HEMOGLOBIN A1C: Hgb A1c MFr Bld: 6 % (ref 4.6–6.5)

## 2022-03-28 ENCOUNTER — Telehealth: Payer: Self-pay | Admitting: Physical Medicine and Rehabilitation

## 2022-03-28 NOTE — Telephone Encounter (Signed)
Patient called advised the injection is working and she thank you all so much. Patient said it's been so long since she got a good night sleep. The number to contact patient is [phone]

## 2022-03-31 MED ORDER — TRIAMCINOLONE ACETONIDE 40 MG/ML IJ SUSP
40.0000 mg | INTRAMUSCULAR | Status: AC | PRN
  Administered 2022-03-25: 40 mg via INTRA_ARTICULAR

## 2022-03-31 MED ORDER — LIDOCAINE HCL 1 % IJ SOLN
5.0000 mL | INTRAMUSCULAR | Status: AC | PRN
  Administered 2022-03-25: 5 mL

## 2022-04-01 ENCOUNTER — Telehealth: Payer: Self-pay | Admitting: Physical Medicine and Rehabilitation

## 2022-04-01 ENCOUNTER — Other Ambulatory Visit: Payer: Self-pay | Admitting: Physical Medicine and Rehabilitation

## 2022-04-01 DIAGNOSIS — M5416 Radiculopathy, lumbar region: Secondary | ICD-10-CM

## 2022-04-01 NOTE — Telephone Encounter (Signed)
Spoke with patient and she stated the injection has helped her hip, but she has started to have cramping in her leg to the calf. She stated her leg is sore and she wants to know what to do about it. Please advise

## 2022-04-01 NOTE — Telephone Encounter (Signed)
Spoke with patient and informed her of the information below. Verbalized understanding 

## 2022-04-01 NOTE — Telephone Encounter (Signed)
Please call patient she is needing to speak to someone about her injection.  [phone]

## 2022-04-02 ENCOUNTER — Encounter: Payer: Self-pay | Admitting: Family Medicine

## 2022-04-02 ENCOUNTER — Ambulatory Visit (INDEPENDENT_AMBULATORY_CARE_PROVIDER_SITE_OTHER): Payer: Medicare Other | Admitting: Family Medicine

## 2022-04-02 VITALS — BP 130/68 | HR 77 | Temp 97.6°F | Ht 62.25 in | Wt 134.2 lb

## 2022-04-02 DIAGNOSIS — R7309 Other abnormal glucose: Secondary | ICD-10-CM | POA: Diagnosis not present

## 2022-04-02 DIAGNOSIS — I700 Atherosclerosis of aorta: Secondary | ICD-10-CM | POA: Diagnosis not present

## 2022-04-02 DIAGNOSIS — E7800 Pure hypercholesterolemia, unspecified: Secondary | ICD-10-CM | POA: Diagnosis not present

## 2022-04-02 DIAGNOSIS — Z1211 Encounter for screening for malignant neoplasm of colon: Secondary | ICD-10-CM | POA: Diagnosis not present

## 2022-04-02 DIAGNOSIS — K219 Gastro-esophageal reflux disease without esophagitis: Secondary | ICD-10-CM | POA: Diagnosis not present

## 2022-04-02 DIAGNOSIS — M8589 Other specified disorders of bone density and structure, multiple sites: Secondary | ICD-10-CM | POA: Diagnosis not present

## 2022-04-02 DIAGNOSIS — Z79899 Other long term (current) drug therapy: Secondary | ICD-10-CM

## 2022-04-02 DIAGNOSIS — Z853 Personal history of malignant neoplasm of breast: Secondary | ICD-10-CM

## 2022-04-02 MED ORDER — FLUTICASONE PROPIONATE 50 MCG/ACT NA SUSP: NASAL | 3 refills | Status: DC

## 2022-04-02 MED ORDER — ESOMEPRAZOLE MAGNESIUM 40 MG PO CPDR: 40.0000 mg | DELAYED_RELEASE_CAPSULE | Freq: Every day | ORAL | 3 refills | Status: DC

## 2022-04-02 NOTE — Progress Notes (Signed)
Subjective:    Patient ID: Gina Williams, female    DOB: [birth year], 77 y.o.   MRN: [id number]  HPI Pt presents for annual f/u of chronic health problems  Wt Readings from Last 3 Encounters:  04/02/22 134 lb 4 oz (60.9 kg)  03/19/22 139 lb (63 kg)  03/06/22 138 lb (62.6 kg)   24.36 kg/m  Seeing orthopedics for hip and leg and neck issues  Had injection for bursitis and feeling better   May have MRI     Vitals:   04/02/22 0816  BP: 130/68  Pulse: 77  Temp: 97.6 F (36.4 C)  SpO2: 98%   Immunization History  Administered Date(s) Administered   Influenza,inj,Quad PF,6+ Mos 11/18/2012   PFIZER(Purple Top)SARS-COV-2 Vaccination 03/18/2019, 04/12/2019, 01/17/2020   Td 06/29/2012, 01/23/2022   There are no preventive care reminders to display for this patient.  Pt refuses immunizations   Mammogram 01/2022 Personal h/o breast cancer  Self breast exam: no lumps   Dexa 01/2015 osteopenia - declines any more  Falls: none Fractures: none Supplements : vit D3  Exercise : working in the yard / heavy duty stuff (shovel and pitch fork)  Doing some abbreviated push ups Started walking    Colonoscopy 2015 Declines additional colon screening  No stool changes and no blood in stool  Dermatology care Goes regularly  Needs to be better with sunscreen     BP Readings from Last 3 Encounters:  04/02/22 130/68  03/19/22 133/68  01/23/22 (!) 142/76   Pulse Readings from Last 3 Encounters:  04/02/22 77  03/19/22 81  01/23/22 75   H/o atherosclerosis of aorta   GERD Nexium 40 mg daily  This controls it well  Needs refills  Tried to wean to every other day and she had bad symptoms  If she watches her diet / maybe could try again     Elevated glucose Lab Results  Component Value Date   HGBA1C 6.0 03/26/2022  Up from 5.9 Eats candy every day     Hyperlipidemia Lab Results  Component Value Date   CHOL 189 03/26/2022   CHOL 171 03/19/2021   CHOL  191 09/27/2020   Lab Results  Component Value Date   HDL 56.10 03/26/2022   HDL 44.20 03/19/2021   HDL 47.50 09/27/2020   Lab Results  Component Value Date   LDLCALC 119 (H) 03/26/2022   LDLCALC 101 (H) 03/19/2021   LDLCALC 122 (H) 09/27/2020   Lab Results  Component Value Date   TRIG 67.0 03/26/2022   TRIG 128.0 03/19/2021   TRIG 105.0 09/27/2020   Lab Results  Component Value Date   CHOLHDL 3 03/26/2022   CHOLHDL 4 03/19/2021   CHOLHDL 4 09/27/2020   Lab Results  Component Value Date   LDLDIRECT 156.2 10/27/2012   Candy/ some fatty food  No fried food  Beef less than once per month No fatty pork   The 10-year ASCVD risk score (Arnett DK, et al., 2019) is: 20.3%   Values used to calculate the score:     Age: 39 years     Sex: Female     Is Non-Hispanic African American: No     Diabetic: No     Tobacco smoker: No     Systolic Blood Pressure: [id number] mmHg     Is BP treated: No     HDL Cholesterol: 56.1 mg/dL     Total Cholesterol: 189 mg/dL    Lab Results  Component Value  Date   WBC 9.2 03/26/2022   HGB 13.8 03/26/2022   HCT 41.5 03/26/2022   MCV 89.1 03/26/2022   PLT 331.0 03/26/2022   Lab Results  Component Value Date   ALT 10 03/26/2022   AST 13 03/26/2022   ALKPHOS 58 03/26/2022   BILITOT 0.4 03/26/2022   Lab Results  Component Value Date   TSH 0.68 03/26/2022   Lab Results  Component Value Date   CREATININE 0.85 03/26/2022   BUN 20 03/26/2022   NA 141 03/26/2022   K 4.4 03/26/2022   CL 107 03/26/2022   CO2 25 03/26/2022    Patient Active Problem List   Diagnosis Date Noted   Right hip pain 03/19/2022   Sleep disorder 03/19/2022   Mouth ulcer 03/19/2022   Fatigue 06/21/2019   Parent refuses immunizations 06/29/2018   Elevated glucose level 06/20/2018   Cystocele with prolapse 09/22/2017   S/P hysterectomy 10/23/2016   Atherosclerosis of aorta ([place]) 09/23/2016   Routine general medical examination at a health care facility  12/01/2014   Estrogen deficiency 12/01/2014   Osteopenia 11/28/2013   Colon cancer screening 11/03/2012   Hyperlipidemia 11/03/2012   Encounter for Medicare annual wellness exam 10/26/2012   GERD 01/23/2010   History of breast cancer 01/23/2010   Past Medical History:  Diagnosis Date   Arthritis    hands   Breast cancer ([place]) 09/16/2007   Mastectomy   Dyspnea    last 5 months, lack of conditioning   GERD (gastroesophageal reflux disease)    Hypoglycemic reaction    fainted in her youth , hadnt eaten anything for hours and was assisting with a 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 BMD   Personal history of chemotherapy    PONV (postoperative nausea and vomiting)    SEVERE, patient is very concerned about this issue occurred with mastectomy sugery ; no issues whn she had hysterectomy    Vertigo 2007   Past Surgical History:  Procedure Laterality Date   ANTERIOR AND POSTERIOR REPAIR N/A 09/22/2017   Procedure: CYSTOSCOPY ANTERIOR (CYSTOCELE);  Surgeon: Bjorn Loser, MD;  Location: [facility];  Service: Urology;  Laterality: N/A;   BASAL CELL CARCINOMA EXCISION N/A 12/03/2020   CATARACT EXTRACTION, BILATERAL     COLONOSCOPY     CYSTOSCOPY N/A 10/23/2016   Procedure: CYSTOSCOPY;  Surgeon: Aloha Gell, MD;  Location: [facility];  Service: Gynecology;  Laterality: N/A;   GANGLION CYST EXCISION Left 1970s   Multiple surgeries   HAND SURGERY Left 10/2005   Nerve damage   HAND SURGERY Left 2011   Torn ligament surgery   MASTECTOMY  08/2007   Breast Cancer   Nuclear Stress Test     Negative  EF 77%   pap  08/05/2014   [facility]   pessary  12/19/2015   [facility] Dr. Pamala Hurry   ROBOTIC ASSISTED TOTAL HYSTERECTOMY WITH BILATERAL SALPINGO OOPHERECTOMY Bilateral 10/23/2016   Procedure: ROBOTIC ASSISTED TOTAL HYSTERECTOMY WITH BILATERAL SALPINGO OOPHORECTOMY/Uterosacral Ligament Suspension;  Surgeon:  Aloha Gell, MD;  Location: [facility];  Service: Gynecology;  Laterality: Bilateral;   SHOULDER SURGERY Bilateral Late 1980s   Frozen shoulders bilaterally, no surgery, had physical therapy   Stress myoview  12/2003   Low risk   TONSILLECTOMY  Late 1970s   TUBAL LIGATION     VAGINAL PROLAPSE REPAIR N/A 09/22/2017   Procedure: VAGINAL  VAULT SUSPENSION AND GRAFT;  Surgeon: Bjorn Loser, MD;  Location: [facility];  Service: Urology;  Laterality: N/A;   YAG LASER APPLICATION Bilateral [id number]   Social History   Tobacco Use   Smoking status: Former    Types: Cigarettes    Quit date: 05/21/1998    Years since quitting: 23.8   Smokeless tobacco: Never  Vaping Use   Vaping Use: Never used  Substance Use Topics   Alcohol use: No    Alcohol/week: 0.0 standard drinks of alcohol   Drug use: No   Family History  Problem Relation Age of Onset   Asthma Mother    Aortic aneurysm Mother    Stroke Father    Alzheimer's disease Father    Cancer Paternal Aunt        Breast Cancer   Breast cancer Paternal Aunt    Cancer Paternal Aunt        Breast Cancer   Breast cancer Paternal Aunt    Cancer Paternal Aunt        Breast Cancer   Breast cancer Paternal Aunt    Stroke Maternal Uncle    Hypertension Other        HTN on Father's side of family   Allergies  Allergen Reactions   Amoxicillin-Pot Clavulanate Nausea Only and Other (See Comments)    REACTION: nausea (tolerates amoxil) Has patient had a PCN reaction causing immediate rash, facial/tongue/throat swelling, SOB or lightheadedness with hypotension: No Has patient had a PCN reaction causing severe rash involving mucus membranes or skin necrosis: Unknown Has patient had a PCN reaction that required hospitalization: No Has patient had a PCN reaction occurring within the last 10 years: No If all of the above answers are "NO", then may proceed with Cephalosporin use.    Omeprazole Other (See Comments)    ineffective    Other  Nausea And Vomiting and Other (See Comments)    Anesthesia-vomiting/dry heaves (up to ~12hrs after)   Current Outpatient Medications on File Prior to Visit  Medication Sig Dispense Refill   acetaminophen (TYLENOL) 500 MG tablet Take 500 mg by mouth every 6 (six) hours as needed for moderate pain or headache.      Cholecalciferol (VITAMIN D3 PO) Take 2,000 Units by mouth daily.      diphenhydrAMINE (BENADRYL) 25 mg capsule Take 25 mg by mouth at bedtime.     melatonin 5 MG TABS Take 5 mg by mouth at bedtime as needed.     Omega-3 Fatty Acids (FISH OIL PO) Take 1,900 mg by mouth daily.     Polyethyl Glycol-Propyl Glycol (SYSTANE) 0.4-0.3 % SOLN Apply to eye.     No current facility-administered medications on file prior to visit.      Review of Systems  Constitutional:  Negative for activity change, appetite change, fatigue, fever and unexpected weight change.  HENT:  Negative for congestion, ear pain, rhinorrhea, sinus pressure and sore throat.   Eyes:  Negative for pain, redness and visual disturbance.  Respiratory:  Negative for cough, shortness of breath and wheezing.   Cardiovascular:  Negative for chest pain and palpitations.  Gastrointestinal:  Negative for abdominal pain, blood in stool, constipation and diarrhea.  Endocrine: Negative for polydipsia and polyuria.  Genitourinary:  Negative for dysuria, frequency and urgency.  Musculoskeletal:  Positive for arthralgias. Negative for back pain and myalgias.       Leg and hip pain are improved  Had some R leg spasm after hard work outdoors Improved  now Seeing ortho  Skin:  Negative for pallor and rash.  Allergic/Immunologic: Negative for environmental allergies.  Neurological:  Negative for dizziness, syncope and headaches.  Hematological:  Negative for adenopathy. Does not bruise/bleed easily.  Psychiatric/Behavioral:  Negative for decreased concentration and dysphoric mood. The patient is not nervous/anxious.         Objective:   Physical Exam Constitutional:      General: She is not in acute distress.    Appearance: Normal appearance. She is well-developed and normal weight. She is not ill-appearing or diaphoretic.  HENT:     Head: Normocephalic and atraumatic.     Right Ear: Tympanic membrane, ear canal and external ear normal.     Left Ear: Tympanic membrane, ear canal and external ear normal.     Nose: Nose normal. No congestion.     Mouth/Throat:     Mouth: Mucous membranes are moist.     Pharynx: Oropharynx is clear. No posterior oropharyngeal erythema.  Eyes:     General: No scleral icterus.    Extraocular Movements: Extraocular movements intact.     Conjunctiva/sclera: Conjunctivae normal.     Pupils: Pupils are equal, round, and reactive to light.  Neck:     Thyroid: No thyromegaly.     Vascular: No carotid bruit or JVD.  Cardiovascular:     Rate and Rhythm: Normal rate and regular rhythm.     Pulses: Normal pulses.     Heart sounds: Normal heart sounds.     No gallop.  Pulmonary:     Effort: Pulmonary effort is normal. No respiratory distress.     Breath sounds: Normal breath sounds. No wheezing.     Comments: Good air exch Chest:     Chest wall: No tenderness.  Abdominal:     General: Bowel sounds are normal. There is no distension or abdominal bruit.     Palpations: Abdomen is soft. There is no mass.     Tenderness: There is no abdominal tenderness.     Hernia: No hernia is present.  Genitourinary:    Comments: Breast exam, left:  No mass, nodules, thickening, tenderness, bulging, retraction, inflamation, nipple discharge or skin changes noted.  No axillary or clavicular LA.      R mastectomy site is clear   Musculoskeletal:        General: No tenderness. Normal range of motion.     Cervical back: Normal range of motion and neck supple. No rigidity. No muscular tenderness.     Right lower leg: No edema.     Left lower leg: No edema.     Comments: No kyphosis    Lymphadenopathy:     Cervical: No cervical adenopathy.  Skin:    General: Skin is warm and dry.     Coloration: Skin is not pale.     Findings: No erythema or rash.     Comments: Solar lentigines diffusely   Neurological:     Mental Status: She is alert. Mental status is at baseline.     Cranial Nerves: No cranial nerve deficit.     Motor: No abnormal muscle tone.     Coordination: Coordination normal.     Gait: Gait normal.     Deep Tendon Reflexes: Reflexes are normal and symmetric. Reflexes normal.  Psychiatric:        Mood and Affect: Mood normal.        Cognition and Memory: Cognition and memory normal.  Assessment & Plan:   Problem List Items Addressed This Visit       Cardiovascular and Mediastinum   Atherosclerosis of aorta ([facility])    LDL cholesterol went up - going to work on diet  Consider statin if not imp  Bp is stable  No clinical changes          Digestive   GERD    Enc to watch diet  Will try to wean nexium 40 mg to every other day Disc expected rebound symptoms Handout given  Consider B12 level next time       Relevant Medications   esomeprazole (NEXIUM) 40 MG capsule     Musculoskeletal and Integument   Osteopenia    dexa 2017 Declines another  Declines tx No falls ro fractures  Taking vit D Encouraged wt bearing exercise         Other   Colon cancer screening    Declines screening of any kind      Current use of proton pump inhibitor    Will check B12 next time       Elevated glucose level    Lab Results  Component Value Date   HGBA1C 6.0 03/26/2022  Eating candy daily  Plans to quit disc imp of low glycemic diet and wt loss to prevent DM2       History of breast cancer    Past R mastectomy  Utd mammogram  No complaints       Hyperlipidemia - Primary    Disc goals for lipids and reasons to control them Rev last labs with pt Rev low sat fat diet in detail LDL is up to 119  ASCVD risk score  20.3%  Disc dietary change Planning to stop high fat candy  Continue to monitor

## 2022-04-02 NOTE — Patient Instructions (Addendum)
Avoid foods /beverages that flare your acid reflux  Try taking nexium every other day  Tums or pepcid is ok    To prevent diabetes Try to get most of your carbohydrates from produce (with the exception of white potatoes)  Eat less bread/pasta/rice/snack foods/cereals/sweets and other items from the middle of the grocery store (processed carbs)  Think about cutting back the candy    For cholesterol  Avoid red meat/ fried foods/ egg yolks/ fatty breakfast meats/ butter, cheese and high fat dairy/ and shellfish    Take care of yourself  Add any muscle building/strength exercise you can   Get more protein in your diet  Meat, dairy , dried beans and some legumes, eggs, nuts, nut butters, soy products

## 2022-04-02 NOTE — Assessment & Plan Note (Signed)
Declines screening of any kind

## 2022-04-02 NOTE — Assessment & Plan Note (Signed)
LDL cholesterol went up - going to work on diet  Consider statin if not imp  Bp is stable  No clinical changes

## 2022-04-02 NOTE — Assessment & Plan Note (Signed)
dexa 2017 Declines another  Declines tx No falls ro fractures  Taking vit D Encouraged wt bearing exercise

## 2022-04-02 NOTE — Assessment & Plan Note (Signed)
Enc to watch diet  Will try to wean nexium 40 mg to every other day Disc expected rebound symptoms Handout given  Consider B12 level next time

## 2022-04-02 NOTE — Assessment & Plan Note (Signed)
Will check B12 next time

## 2022-04-02 NOTE — Assessment & Plan Note (Signed)
Lab Results  Component Value Date   HGBA1C 6.0 03/26/2022   Eating candy daily  Plans to quit disc imp of low glycemic diet and wt loss to prevent DM2

## 2022-04-02 NOTE — Assessment & Plan Note (Signed)
Past R mastectomy  Utd mammogram  No complaints

## 2022-04-02 NOTE — Assessment & Plan Note (Signed)
Disc goals for lipids and reasons to control them Rev last labs with pt Rev low sat fat diet in detail LDL is up to 119  ASCVD risk score 20.3%  Disc dietary change Planning to stop high fat candy  Continue to monitor

## 2022-04-22 ENCOUNTER — Ambulatory Visit
Admission: RE | Admit: 2022-04-22 | Discharge: 2022-04-22 | Disposition: A | Discharge status: Home / Self Care | Payer: Medicare Other | Source: Ambulatory Visit | Attending: Physical Medicine and Rehabilitation | Admitting: Physical Medicine and Rehabilitation

## 2022-04-22 DIAGNOSIS — M5416 Radiculopathy, lumbar region: Secondary | ICD-10-CM

## 2022-04-22 DIAGNOSIS — M5137 Other intervertebral disc degeneration, lumbosacral region: Secondary | ICD-10-CM | POA: Diagnosis not present

## 2022-04-22 DIAGNOSIS — M545 Low back pain, unspecified: Secondary | ICD-10-CM | POA: Diagnosis not present

## 2022-04-24 ENCOUNTER — Telehealth: Payer: Self-pay | Admitting: Physical Medicine and Rehabilitation

## 2022-04-24 NOTE — Telephone Encounter (Signed)
Spoke with patient this morning, recent lumbar MRI imaging shows multi level degenerative changes, prominent at L4-L5 where there is moderate facet arthropathy. No nerve root encroachment, no high grade spinal canal stenosis noted. She is feeling much better post right greater trochanter injection performed in our office on 03/25/2022. I instructed patient to let us know if her pain returns, could look at repeating right GT injection. She agrees with plan, no questions noted at this time.

## 2022-05-09 ENCOUNTER — Telehealth: Payer: Self-pay | Admitting: Family Medicine

## 2022-05-09 NOTE — Telephone Encounter (Signed)
Patient was advised to take medication esomeprazole (NEXIUM) 40 MG capsule every other day.She called in today stating that does not work for her.She said that the pain in her upper stomach has gotten worse.She said that she quit eating the things she know will cause stomach pain as well. She wanted Dr Milinda Antis to know that she is back to taking it every day.

## 2022-05-09 NOTE — Telephone Encounter (Signed)
Pt notified as instructed and pt voiced understanding. Pt appreciative of call.

## 2022-05-09 NOTE — Telephone Encounter (Signed)
Thanks for letting me know  Go back to daily  Send in refills if needed   Update if not starting to improve in a week or if worsening

## 2022-06-03 DIAGNOSIS — Z961 Presence of intraocular lens: Secondary | ICD-10-CM | POA: Diagnosis not present

## 2022-06-03 DIAGNOSIS — H524 Presbyopia: Secondary | ICD-10-CM | POA: Diagnosis not present

## 2022-06-03 DIAGNOSIS — H52203 Unspecified astigmatism, bilateral: Secondary | ICD-10-CM | POA: Diagnosis not present

## 2022-06-03 DIAGNOSIS — H353132 Nonexudative age-related macular degeneration, bilateral, intermediate dry stage: Secondary | ICD-10-CM | POA: Diagnosis not present

## 2022-06-24 ENCOUNTER — Encounter: Payer: Self-pay | Admitting: Family Medicine

## 2022-06-24 ENCOUNTER — Ambulatory Visit (INDEPENDENT_AMBULATORY_CARE_PROVIDER_SITE_OTHER): Payer: Medicare Other | Admitting: Family Medicine

## 2022-06-24 ENCOUNTER — Ambulatory Visit (INDEPENDENT_AMBULATORY_CARE_PROVIDER_SITE_OTHER)
Admission: RE | Admit: 2022-06-24 | Discharge: 2022-06-24 | Disposition: A | Discharge status: Home / Self Care | Payer: Medicare Other | Source: Ambulatory Visit | Attending: Family Medicine | Admitting: Family Medicine

## 2022-06-24 VITALS — BP 124/82 | HR 72 | Temp 97.4°F | Ht 62.25 in | Wt 129.0 lb

## 2022-06-24 DIAGNOSIS — R1030 Lower abdominal pain, unspecified: Secondary | ICD-10-CM | POA: Diagnosis not present

## 2022-06-24 DIAGNOSIS — E538 Deficiency of other specified B group vitamins: Secondary | ICD-10-CM | POA: Insufficient documentation

## 2022-06-24 DIAGNOSIS — K5900 Constipation, unspecified: Secondary | ICD-10-CM | POA: Insufficient documentation

## 2022-06-24 DIAGNOSIS — Z79899 Other long term (current) drug therapy: Secondary | ICD-10-CM

## 2022-06-24 LAB — HEPATIC FUNCTION PANEL
ALT: 10 U/L (ref 0–35)
AST: 14 U/L (ref 0–37)
Albumin: 4.2 g/dL (ref 3.5–5.2)
Alkaline Phosphatase: 58 U/L (ref 39–117)
Bilirubin, Direct: 0.1 mg/dL (ref 0.0–0.3)
Total Bilirubin: 0.5 mg/dL (ref 0.2–1.2)
Total Protein: 6.8 g/dL (ref 6.0–8.3)

## 2022-06-24 LAB — BASIC METABOLIC PANEL
BUN: 15 mg/dL (ref 6–23)
CO2: 26 mEq/L (ref 19–32)
Calcium: 9.5 mg/dL (ref 8.4–10.5)
Chloride: 105 mEq/L (ref 96–112)
Creatinine, Ser: 0.79 mg/dL (ref 0.40–1.20)
GFR: 72.06 mL/min (ref >60.00)
Glucose, Bld: 82 mg/dL (ref 70–99)
Potassium: 4.3 mEq/L (ref 3.5–5.1)
Sodium: 140 mEq/L (ref 135–145)

## 2022-06-24 LAB — CBC WITH DIFFERENTIAL/PLATELET
Basophils Absolute: 0.1 10*3/uL (ref 0.0–0.1)
Basophils Relative: 0.7 % (ref 0.0–3.0)
Eosinophils Absolute: 0.1 10*3/uL (ref 0.0–0.7)
Eosinophils Relative: 1.3 % (ref 0.0–5.0)
HCT: 43 % (ref 36.0–46.0)
Hemoglobin: 13.9 g/dL (ref 12.0–15.0)
Lymphocytes Relative: 24.4 % (ref 12.0–46.0)
Lymphs Abs: 2.3 10*3/uL (ref 0.7–4.0)
MCHC: 32.4 g/dL (ref 30.0–36.0)
MCV: 90.9 fl (ref 78.0–100.0)
Monocytes Absolute: 0.7 10*3/uL (ref 0.1–1.0)
Monocytes Relative: 7.7 % (ref 3.0–12.0)
Neutro Abs: 6.3 10*3/uL (ref 1.4–7.7)
Neutrophils Relative %: 65.9 % (ref 43.0–77.0)
Platelets: 330 10*3/uL (ref 150.0–400.0)
RBC: 4.73 Mil/uL (ref 3.87–5.11)
RDW: 13.4 % (ref 11.5–15.5)
WBC: 9.5 10*3/uL (ref 4.0–10.5)

## 2022-06-24 LAB — TSH: TSH: 0.8 u[IU]/mL (ref 0.35–5.50)

## 2022-06-24 LAB — LIPASE: Lipase: 20 U/L (ref 11.0–59.0)

## 2022-06-24 LAB — MAGNESIUM: Magnesium: 2 mg/dL (ref 1.5–2.5)

## 2022-06-24 LAB — VITAMIN B12: Vitamin B-12: 165 pg/mL — ABNORMAL LOW (ref 211–911)

## 2022-06-24 NOTE — Assessment & Plan Note (Signed)
Improved with miralax Occurred after steroid shot  ? If flora was disrupted  Also had dec appetite   Will start align over the counter  Continue diet with fiber  Lots of fluids Miralax  Reassuring xr today  Consider early colonoscopy or imaging if needed

## 2022-06-24 NOTE — Assessment & Plan Note (Signed)
B12 level and mag level ordered ? If adding to constipation

## 2022-06-24 NOTE — Assessment & Plan Note (Signed)
With constipation  Lab today  Abd xr reassuring  Trial of probiotic  Consider CT depending on lab results and resp to probiotic  Colonoscopy is due 02/2023- may need early if no imp

## 2022-06-24 NOTE — Progress Notes (Signed)
Subjective:    Patient ID: Gina Williams, female    DOB: 02/14/1945, 77 y.o.   MRN: [id number]  HPI Pt presents with c/o constipation and appetite change   Wt Readings from Last 3 Encounters:  06/24/22 129 lb (58.5 kg)  04/02/22 134 lb 4 oz (60.9 kg)  03/19/22 139 lb (63 kg)   23.41 kg/m  Vitals:   06/24/22 1040  BP: 124/82  Pulse: 72  Temp: (!) 97.4 F (36.3 C)  SpO2: 96%     Had steroid inj in march    Then no appetite-mid march  Bp went up for a few days   Episodes of constipation  (mid April) - severe at times  Laxative did not help  Would have bm every 3 d at most  Miralax daily and prunes did help   Pain in lower abd -constant pressure in low belly and bowel   Stool is mushy- with mirilax When firmer - is narrow and pencil like stool   Today is a better day   Colonoscopy 02/25/13 at [facility], was normal    Takes nexium for GERD   Had bladder tack in past - now prolapse is worse again  Saw urology a year ago - observing for now  Hysterectomy -total   Uses a magnesium topical  Does not tolerate orally   No blood in stool    Abd xr DG Abd 2 Views  Result Date: 06/24/2022 CLINICAL DATA:  Lower abdominal pain with new constipation EXAM: ABDOMEN - 2 VIEW COMPARISON:  None Available. FINDINGS: The bowel gas pattern is normal. There is no evidence of free air. No radio-opaque calculi or other significant radiographic abnormality is seen. No excessive stool burden. Visualized lung bases clear. IMPRESSION: Negative. Electronically Signed   By: Charlett Nose M.D.   On: 06/24/2022 12:29     Patient Active Problem List   Diagnosis Date Noted   Lower abdominal pain 06/24/2022   Constipation 06/24/2022   Current use of proton pump inhibitor 04/02/2022   Right hip pain 03/19/2022   Sleep disorder 03/19/2022   Mouth ulcer 03/19/2022   Fatigue 06/21/2019   Parent refuses immunizations 06/29/2018   Elevated glucose level 06/20/2018   Cystocele with  prolapse 09/22/2017   S/P hysterectomy 10/23/2016   Atherosclerosis of aorta (HCC) 09/23/2016   Routine general medical examination at a health care facility 12/01/2014   Estrogen deficiency 12/01/2014   Osteopenia 11/28/2013   Colon cancer screening 11/03/2012   Hyperlipidemia 11/03/2012   Encounter for Medicare annual wellness exam 10/26/2012   GERD 01/23/2010   History of breast cancer 01/23/2010   Past Medical History:  Diagnosis Date   Arthritis    hands   Breast cancer (HCC) 09/16/2007   Mastectomy   Dyspnea    last 5 months, lack of conditioning   GERD (gastroesophageal reflux disease)    Hypoglycemic reaction    fainted in her youth , hadnt eaten anything for hours and was assisting with a 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 BMD   Personal history of chemotherapy    PONV (postoperative nausea and vomiting)    SEVERE, patient is very concerned about this issue occurred with mastectomy sugery ; no issues whn she had hysterectomy    Vertigo 2007   Past Surgical History:  Procedure Laterality Date   ANTERIOR AND POSTERIOR REPAIR N/A  09/22/2017   Procedure: CYSTOSCOPY ANTERIOR (CYSTOCELE);  Surgeon: Alfredo Martinez, MD;  Location: [facility];  Service: Urology;  Laterality: N/A;   BASAL CELL CARCINOMA EXCISION N/A 12/03/2020   CATARACT EXTRACTION, BILATERAL     COLONOSCOPY     CYSTOSCOPY N/A 10/23/2016   Procedure: CYSTOSCOPY;  Surgeon: Noland Fordyce, MD;  Location: [facility];  Service: Gynecology;  Laterality: N/A;   GANGLION CYST EXCISION Left 1970s   Multiple surgeries   HAND SURGERY Left 10/2005   Nerve damage   HAND SURGERY Left 2011   Torn ligament surgery   MASTECTOMY  08/2007   Breast Cancer   Nuclear Stress Test     Negative  EF 77%   pap  08/05/2014   [facility]   pessary  12/19/2015   [facility] Dr. Ernestina Penna   ROBOTIC ASSISTED TOTAL HYSTERECTOMY WITH BILATERAL SALPINGO  OOPHERECTOMY Bilateral 10/23/2016   Procedure: ROBOTIC ASSISTED TOTAL HYSTERECTOMY WITH BILATERAL SALPINGO OOPHORECTOMY/Uterosacral Ligament Suspension;  Surgeon: Noland Fordyce, MD;  Location: [facility];  Service: Gynecology;  Laterality: Bilateral;   SHOULDER SURGERY Bilateral Late 1980s   Frozen shoulders bilaterally, no surgery, had physical therapy   Stress myoview  12/2003   Low risk   TONSILLECTOMY  Late 1970s   TUBAL LIGATION     VAGINAL PROLAPSE REPAIR N/A 09/22/2017   Procedure: VAGINAL VAULT SUSPENSION AND GRAFT;  Surgeon: Alfredo Martinez, MD;  Location: [facility];  Service: Urology;  Laterality: N/A;   YAG LASER APPLICATION Bilateral 04/24/2020   Social History   Tobacco Use   Smoking status: Former    Types: Cigarettes    Quit date: 05/21/1998    Years since quitting: 24.1   Smokeless tobacco: Never  Vaping Use   Vaping Use: Never used  Substance Use Topics   Alcohol use: No    Alcohol/week: 0.0 standard drinks of alcohol   Drug use: No   Family History  Problem Relation Age of Onset   Asthma Mother    Aortic aneurysm Mother    Stroke Father    Alzheimer's disease Father    Cancer Paternal Aunt        Breast Cancer   Breast cancer Paternal Aunt    Cancer Paternal Aunt        Breast Cancer   Breast cancer Paternal Aunt    Cancer Paternal Aunt        Breast Cancer   Breast cancer Paternal Aunt    Stroke Maternal Uncle    Hypertension Other        HTN on Father's side of family   Allergies  Allergen Reactions   Amoxicillin-Pot Clavulanate Nausea Only and Other (See Comments)    REACTION: nausea (tolerates amoxil) Has patient had a PCN reaction causing immediate rash, facial/tongue/throat swelling, SOB or lightheadedness with hypotension: No Has patient had a PCN reaction causing severe rash involving mucus membranes or skin necrosis: Unknown Has patient had a PCN reaction that required hospitalization: No Has patient had a PCN reaction occurring within the  last 10 years: No If all of the above answers are "NO", then may proceed with Cephalosporin use.    Omeprazole Other (See Comments)    ineffective    Other Nausea And Vomiting and Other (See Comments)    Anesthesia-vomiting/dry heaves (up to ~12hrs after)   Current Outpatient Medications on File Prior to Visit  Medication Sig Dispense Refill   acetaminophen (TYLENOL) 500 MG tablet Take 500 mg by mouth every 6 (six) hours as  needed for moderate pain or headache.      Cholecalciferol (VITAMIN D3 PO) Take 2,000 Units by mouth daily.      diphenhydrAMINE (BENADRYL) 25 mg capsule Take 25 mg by mouth at bedtime.     esomeprazole (NEXIUM) 40 MG capsule Take 1 capsule (40 mg total) by mouth daily. 90 capsule 3   fluticasone (FLONASE) 50 MCG/ACT nasal spray USE 2 SPRAY(S) IN EACH NOSTRIL ONCE DAILY AS NEEDED FOR  ALLERGIES  OR  RHINITIS 48 g 3   melatonin 5 MG TABS Take 5 mg by mouth at bedtime as needed.     Omega-3 Fatty Acids (FISH OIL PO) Take 1,900 mg by mouth daily.     Polyethyl Glycol-Propyl Glycol (SYSTANE) 0.4-0.3 % SOLN Apply to eye.     polyethylene glycol (MIRALAX / GLYCOLAX) 17 g packet Take 17 g by mouth daily.     No current facility-administered medications on file prior to visit.    Review of Systems  Constitutional:  Negative for activity change, appetite change, fatigue, fever and unexpected weight change.  HENT:  Negative for congestion, ear pain, rhinorrhea, sinus pressure and sore throat.   Eyes:  Negative for pain, redness and visual disturbance.  Respiratory:  Negative for cough, shortness of breath and wheezing.   Cardiovascular:  Negative for chest pain and palpitations.  Gastrointestinal:  Positive for abdominal distention, abdominal pain and constipation. Negative for blood in stool, diarrhea, nausea, rectal pain and vomiting.  Endocrine: Negative for polydipsia and polyuria.  Genitourinary:  Negative for dysuria, frequency and urgency.  Musculoskeletal:  Negative  for arthralgias, back pain and myalgias.  Skin:  Negative for pallor and rash.  Allergic/Immunologic: Negative for environmental allergies.  Neurological:  Negative for dizziness, syncope and headaches.  Hematological:  Negative for adenopathy. Does not bruise/bleed easily.  Psychiatric/Behavioral:  Negative for decreased concentration and dysphoric mood. The patient is not nervous/anxious.        Objective:   Physical Exam Constitutional:      General: She is not in acute distress.    Appearance: Normal appearance. She is well-developed and normal weight. She is not ill-appearing or diaphoretic.  HENT:     Head: Normocephalic and atraumatic.  Eyes:     General: No scleral icterus.    Conjunctiva/sclera: Conjunctivae normal.     Pupils: Pupils are equal, round, and reactive to light.  Cardiovascular:     Rate and Rhythm: Normal rate and regular rhythm.     Heart sounds: Normal heart sounds.  Pulmonary:     Effort: Pulmonary effort is normal. No respiratory distress.     Breath sounds: Normal breath sounds. No wheezing or rales.  Abdominal:     General: Abdomen is flat. Bowel sounds are normal. There is no distension.     Palpations: Abdomen is soft. There is no hepatomegaly, splenomegaly, mass or pulsatile mass.     Tenderness: There is no abdominal tenderness. There is no guarding or rebound. Negative signs include Murphy's sign and McBurney's sign.     Hernia: No hernia is present.  Musculoskeletal:     Cervical back: Normal range of motion and neck supple.  Lymphadenopathy:     Cervical: No cervical adenopathy.  Skin:    General: Skin is warm and dry.     Coloration: Skin is not pale.     Findings: No erythema.  Neurological:     Mental Status: She is alert.  Assessment & Plan:   Problem List Items Addressed This Visit       Other   Lower abdominal pain - Primary    With constipation  Lab today  Abd xr reassuring  Trial of probiotic  Consider CT  depending on lab results and resp to probiotic  Colonoscopy is due 02/2023- may need early if no imp      Relevant Orders   DG Abd 2 Views (Completed)   CBC with Differential/Platelet   Basic metabolic panel   Hepatic function panel   Lipase   Current use of proton pump inhibitor    B12 level and mag level ordered ? If adding to constipation       Relevant Orders   Magnesium   Vitamin B12   Constipation    Improved with miralax Occurred after steroid shot  ? If flora was disrupted  Also had dec appetite   Will start align over the counter  Continue diet with fiber  Lots of fluids Miralax  Reassuring xr today  Consider early colonoscopy or imaging if needed       Relevant Orders   DG Abd 2 Views (Completed)   CBC with Differential/Platelet   Basic metabolic panel   Hepatic function panel   Magnesium   TSH

## 2022-06-24 NOTE — Patient Instructions (Addendum)
Continue miralax Get Align or store brand of probiotic over the counter and take it as directed  Lab today  Abdominal xray today   Alert [place] if symptoms change or worsen   Plan to follow

## 2022-06-25 ENCOUNTER — Encounter: Payer: Self-pay | Admitting: *Deleted

## 2022-07-02 ENCOUNTER — Ambulatory Visit (INDEPENDENT_AMBULATORY_CARE_PROVIDER_SITE_OTHER): Payer: Medicare Other

## 2022-07-02 DIAGNOSIS — E538 Deficiency of other specified B group vitamins: Secondary | ICD-10-CM | POA: Diagnosis not present

## 2022-07-02 MED ORDER — CYANOCOBALAMIN 1000 MCG/ML IJ SOLN
1000.0000 ug | Freq: Once | INTRAMUSCULAR | Status: AC
Start: 2022-07-02 — End: 2022-07-02
  Administered 2022-07-02: 1000 ug via INTRAMUSCULAR

## 2022-07-02 NOTE — Progress Notes (Signed)
Per orders of Dr. Roxy Manns, injection of vitamin b 12  given by Lewanda Rife in left deltoid.this was first B12 injection and pt waited 15 mins after injection with no problems.Patient tolerated injection well. Patient will make appointment for 1 week.

## 2022-07-09 ENCOUNTER — Ambulatory Visit (INDEPENDENT_AMBULATORY_CARE_PROVIDER_SITE_OTHER): Payer: Medicare Other

## 2022-07-09 DIAGNOSIS — E538 Deficiency of other specified B group vitamins: Secondary | ICD-10-CM | POA: Diagnosis not present

## 2022-07-09 MED ORDER — CYANOCOBALAMIN 1000 MCG/ML IJ SOLN
1000.0000 ug | Freq: Once | INTRAMUSCULAR | Status: AC
Start: 2022-07-09 — End: 2022-07-09
  Administered 2022-07-09: 1000 ug via INTRAMUSCULAR

## 2022-07-09 NOTE — Progress Notes (Signed)
Per orders of Dr. Marne Tower, injection of B-12 given by Dominiqua Cooner in left deltoid. Patient tolerated injection well.     

## 2022-07-11 ENCOUNTER — Telehealth: Payer: Self-pay | Admitting: Family Medicine

## 2022-07-11 NOTE — Telephone Encounter (Signed)
Called patient set up for time in afternoon to make patient more comfortable with getting injections. Advised that we can not guarantee that it will be person scheduled to do them today. Patient understands and will reach out if any questions.

## 2022-07-11 NOTE — Telephone Encounter (Signed)
Patient called in and had some concerns regarding her B12 shots and who did it. Patient is requesting some accommodation for who is doing her injection when she come in. She stated  that she can switch to afternoons but I informed her there may not be a guarantee who will do it. She stated that she can be reached at either one of her numbers. Thank you!

## 2022-07-16 ENCOUNTER — Ambulatory Visit (INDEPENDENT_AMBULATORY_CARE_PROVIDER_SITE_OTHER): Payer: Medicare Other

## 2022-07-16 ENCOUNTER — Ambulatory Visit: Payer: Medicare Other

## 2022-07-16 DIAGNOSIS — E538 Deficiency of other specified B group vitamins: Secondary | ICD-10-CM | POA: Diagnosis not present

## 2022-07-16 MED ORDER — CYANOCOBALAMIN 1000 MCG/ML IJ SOLN
1000.0000 ug | Freq: Once | INTRAMUSCULAR | Status: AC
Start: 2022-07-16 — End: 2022-07-16
  Administered 2022-07-16: 1000 ug via INTRAMUSCULAR

## 2022-07-16 NOTE — Progress Notes (Signed)
Per orders of Dr. Milinda Antis, injection of #3 of 4 weekly B12 1000 mcg/ml given by Eual Fines, CMA in Left Deltoid due to prior mastectomy of right breast. Patient tolerated injection well.

## 2022-07-22 NOTE — Progress Notes (Unsigned)
Per orders of Dr. Roxy Manns, injection of B-12 given by Donnamarie Poag in left deltoid.Due to prior mastectomy of right breast  Patient tolerated injection well. Patient will continue over the counter b-12 daily as advised by Dr. Milinda Antis. Patient has lab appointment on 08/27/22 to recheck levels.

## 2022-07-23 ENCOUNTER — Ambulatory Visit: Payer: Medicare Other

## 2022-07-23 ENCOUNTER — Ambulatory Visit (INDEPENDENT_AMBULATORY_CARE_PROVIDER_SITE_OTHER): Payer: Medicare Other

## 2022-07-23 DIAGNOSIS — E538 Deficiency of other specified B group vitamins: Secondary | ICD-10-CM

## 2022-07-23 MED ORDER — CYANOCOBALAMIN 1000 MCG/ML IJ SOLN
1000.0000 ug | Freq: Once | INTRAMUSCULAR | Status: AC
Start: 2022-07-23 — End: 2022-07-23
  Administered 2022-07-23: 1000 ug via INTRAMUSCULAR

## 2022-08-26 ENCOUNTER — Telehealth: Payer: Self-pay | Admitting: Family Medicine

## 2022-08-26 DIAGNOSIS — E538 Deficiency of other specified B group vitamins: Secondary | ICD-10-CM

## 2022-08-26 NOTE — Telephone Encounter (Signed)
-----   Message from Lovena Neighbours sent at 08/12/2022  1:49 PM EDT ----- Regarding: Labs for 8.7.24 Pt is on lab schedule for 8.7.24 for a recheck, please put orders in future. Thank you, Denny Peon

## 2022-08-27 ENCOUNTER — Other Ambulatory Visit (INDEPENDENT_AMBULATORY_CARE_PROVIDER_SITE_OTHER): Payer: Medicare Other

## 2022-08-27 DIAGNOSIS — E538 Deficiency of other specified B group vitamins: Secondary | ICD-10-CM

## 2022-08-27 LAB — VITAMIN B12: Vitamin B-12: 696 pg/mL (ref 211–911)

## 2022-12-08 ENCOUNTER — Encounter: Payer: Self-pay | Admitting: Family

## 2022-12-08 ENCOUNTER — Telehealth: Payer: Self-pay | Admitting: Family Medicine

## 2022-12-08 ENCOUNTER — Ambulatory Visit (INDEPENDENT_AMBULATORY_CARE_PROVIDER_SITE_OTHER): Payer: Medicare Other | Admitting: Family

## 2022-12-08 VITALS — BP 128/62 | HR 70 | Temp 97.8°F | Ht 62.5 in | Wt 133.8 lb

## 2022-12-08 DIAGNOSIS — J069 Acute upper respiratory infection, unspecified: Secondary | ICD-10-CM | POA: Diagnosis not present

## 2022-12-08 DIAGNOSIS — B9689 Other specified bacterial agents as the cause of diseases classified elsewhere: Secondary | ICD-10-CM | POA: Diagnosis not present

## 2022-12-08 DIAGNOSIS — H60311 Diffuse otitis externa, right ear: Secondary | ICD-10-CM

## 2022-12-08 LAB — POCT RAPID STREP A (OFFICE): Rapid Strep A Screen: NEGATIVE

## 2022-12-08 MED ORDER — AMOXICILLIN 500 MG PO CAPS: 500.0000 mg | ORAL_CAPSULE | Freq: Three times a day (TID) | ORAL | 0 refills | Status: DC

## 2022-12-08 MED ORDER — OFLOXACIN 0.3 % OT SOLN: 5.0000 [drp] | Freq: Every day | OTIC | 0 refills | Status: AC

## 2022-12-08 MED ORDER — AMOXICILLIN 875 MG PO TABS: 875.0000 mg | ORAL_TABLET | Freq: Two times a day (BID) | ORAL | 0 refills | Status: AC

## 2022-12-08 MED ORDER — NEOMYCIN-POLYMYXIN-HC 1 % OT SOLN: 3.0000 [drp] | Freq: Four times a day (QID) | OTIC | 0 refills | Status: DC

## 2022-12-08 NOTE — Assessment & Plan Note (Signed)
Strep negative Cortisporin sent to pharmacy  Amox 875 mg po bid x 10 days Strongly suspect OE but due to cervical lymphadenopathy and palpable tenderness along jawline will RX antibiotic as well.

## 2022-12-08 NOTE — Telephone Encounter (Signed)
New RX has been sent to pharmacy.

## 2022-12-08 NOTE — Telephone Encounter (Signed)
Patient called in stating that the ear drops NEOMYCIN-POLYMYXIN-HYDROCORTISONE (CORTISPORIN) 1 % SOLN OTIC solution  are too expensive for her to get.She was advised to call back if they were and Tabitha would send a different kind in for her.

## 2022-12-08 NOTE — Progress Notes (Signed)
Established Patient Office Visit  Subjective:   Patient ID: Gina Williams, female    DOB: Oct 29, 1945  Age: 77 y.o. MRN: [id number]  CC:  Chief Complaint  Patient presents with   Ear Pain    Right ear pain x3 days.    HPI: Gina Williams is a 77 y.o. female presenting on 12/08/2022 for Ear Pain (Right ear pain x3 days.)  Three days ago started to feel 'funky' with poor sleep, she has right ear pain. At times will touch it and very tender and will send pains up to her eye. No change in headache and or blurry vision, very slight headache (states nothing she has not had before). Also does feel some soreness on right jaw. Some slight lymph node tenderness right neck. Slight nasal congestion right nares. No fever, no cough, does have slight sore throat.   Not sharp electrical pains.  Ache with chewing.      ROS: Negative unless specifically indicated above in HPI.   Relevant past medical history reviewed and updated as indicated.   Allergies and medications reviewed and updated.   Current Outpatient Medications:    acetaminophen (TYLENOL) 500 MG tablet, Take 500 mg by mouth every 6 (six) hours as needed for moderate pain or headache. , Disp: , Rfl:    amoxicillin (AMOXIL) 875 MG tablet, Take 1 tablet (875 mg total) by mouth 2 (two) times daily for 10 days., Disp: 20 tablet, Rfl: 0   Cholecalciferol (VITAMIN D3 PO), Take 2,000 Units by mouth daily. , Disp: , Rfl:    cyanocobalamin (VITAMIN B12) 1000 MCG tablet, Take 1,000 mcg by mouth daily., Disp: , Rfl:    diphenhydrAMINE (BENADRYL) 25 mg capsule, Take 25 mg by mouth at bedtime., Disp: , Rfl:    esomeprazole (NEXIUM) 40 MG capsule, Take 1 capsule (40 mg total) by mouth daily., Disp: 90 capsule, Rfl: 3   fluticasone (FLONASE) 50 MCG/ACT nasal spray, USE 2 SPRAY(S) IN EACH NOSTRIL ONCE DAILY AS NEEDED FOR  ALLERGIES  OR  RHINITIS, Disp: 48 g, Rfl: 3   melatonin 5 MG TABS, Take 5 mg by mouth at bedtime as needed., Disp:  , Rfl:    NEOMYCIN-POLYMYXIN-HYDROCORTISONE (CORTISPORIN) 1 % SOLN OTIC solution, Place 3 drops into the right ear 4 (four) times daily for 10 days., Disp: 6 mL, Rfl: 0   Omega-3 Fatty Acids (FISH OIL PO), Take 1,900 mg by mouth daily., Disp: , Rfl:    Polyethyl Glycol-Propyl Glycol (SYSTANE) 0.4-0.3 % SOLN, Apply to eye., Disp: , Rfl:    polyethylene glycol (MIRALAX / GLYCOLAX) 17 g packet, Take 17 g by mouth daily., Disp: , Rfl:   Allergies  Allergen Reactions   Amoxicillin-Pot Clavulanate Nausea Only and Other (See Comments)    REACTION: nausea (tolerates amoxil) Has patient had a PCN reaction causing immediate rash, facial/tongue/throat swelling, SOB or lightheadedness with hypotension: No Has patient had a PCN reaction causing severe rash involving mucus membranes or skin necrosis: Unknown Has patient had a PCN reaction that required hospitalization: No Has patient had a PCN reaction occurring within the last 10 years: No If all of the above answers are "NO", then may proceed with Cephalosporin use.    Omeprazole Other (See Comments)    ineffective    Other Nausea And Vomiting and Other (See Comments)    Anesthesia-vomiting/dry heaves (up to ~12hrs after)    Objective:   BP 128/62 (BP Location: Left Arm, Patient Position: Sitting, Cuff Size: Normal)  Pulse 70   Temp 97.8 F (36.6 C) (Temporal)   Ht 5' 2.5" (1.588 m)   Wt 133 lb 12.8 oz (60.7 kg)   SpO2 96%   BMI 24.08 kg/m    Physical Exam Constitutional:      General: She is not in acute distress.    Appearance: Normal appearance. She is normal weight. She is not ill-appearing, toxic-appearing or diaphoretic.  HENT:     Head: Normocephalic.     Jaw: No tenderness or swelling.     Right Ear: Hearing, tympanic membrane and ear canal normal.     Left Ear: Hearing, tympanic membrane and ear canal normal.     Ears:     Comments: Slight auricle and pinna tenderness on manipulation    Nose: Nose normal.     Right  Turbinates: Swollen and pale.     Mouth/Throat:     Lips: No lesions.     Mouth: Mucous membranes are moist.     Tongue: No lesions.     Pharynx: Postnasal drip present. No oropharyngeal exudate or posterior oropharyngeal erythema.  Eyes:     Extraocular Movements: Extraocular movements intact.     Pupils: Pupils are equal, round, and reactive to light.  Cardiovascular:     Rate and Rhythm: Normal rate and regular rhythm.     Pulses: Normal pulses.     Heart sounds: Normal heart sounds.  Pulmonary:     Effort: Pulmonary effort is normal.     Breath sounds: Normal breath sounds.  Musculoskeletal:     Cervical back: Normal range of motion.  Lymphadenopathy:     Cervical: Cervical adenopathy present.     Right cervical: Superficial cervical adenopathy present.  Neurological:     General: No focal deficit present.     Mental Status: She is alert and oriented to person, place, and time. Mental status is at baseline.  Psychiatric:        Mood and Affect: Mood normal.        Behavior: Behavior normal.        Thought Content: Thought content normal.        Judgment: Judgment normal.     Assessment & Plan:  Acute diffuse otitis externa of right ear Assessment & Plan: Strep negative Cortisporin sent to pharmacy  Amox 875 mg po bid x 10 days Strongly suspect OE but due to cervical lymphadenopathy and palpable tenderness along jawline will RX antibiotic as well.    Orders: -     Neomycin-Polymyxin-HC; Place 3 drops into the right ear 4 (four) times daily for 10 days.  Dispense: 6 mL; Refill: 0 -     Amoxicillin; Take 1 tablet (875 mg total) by mouth 2 (two) times daily for 10 days.  Dispense: 20 tablet; Refill: 0 -     POCT rapid strep A  Bacterial upper respiratory infection -     Amoxicillin; Take 1 tablet (875 mg total) by mouth 2 (two) times daily for 10 days.  Dispense: 20 tablet; Refill: 0     Follow up plan: Return for f/u PCP if no improvement in symptoms.  Mort Sawyers, FNP

## 2022-12-08 NOTE — Telephone Encounter (Signed)
Spoke with pt and she is aware of Tabitha's response. Nothing further was needed.

## 2022-12-23 ENCOUNTER — Other Ambulatory Visit: Payer: Self-pay | Admitting: Family Medicine

## 2022-12-23 DIAGNOSIS — Z1231 Encounter for screening mammogram for malignant neoplasm of breast: Secondary | ICD-10-CM

## 2023-02-06 ENCOUNTER — Ambulatory Visit
Admission: RE | Admit: 2023-02-06 | Discharge: 2023-02-06 | Disposition: A | Discharge status: Home / Self Care | Payer: Medicare Other | Source: Ambulatory Visit | Attending: Family Medicine | Admitting: Family Medicine

## 2023-02-06 ENCOUNTER — Other Ambulatory Visit: Payer: Self-pay | Admitting: Family Medicine

## 2023-02-06 DIAGNOSIS — Z1231 Encounter for screening mammogram for malignant neoplasm of breast: Secondary | ICD-10-CM

## 2023-03-05 IMAGING — MG DIGITAL SCREENING UNILAT LEFT W/ TOMO W/ CAD
6 series · 6 of 18 positions shown · non-contrast
Comparison: Previous exam(s).

CLINICAL DATA: Screening.

EXAM:
DIGITAL SCREENING UNILATERAL LEFT MAMMOGRAM WITH CAD AND
TOMOSYNTHESIS
TECHNIQUE: Left screening digital craniocaudal and mediolateral oblique
mammograms were obtained. Left screening digital breast
tomosynthesis was performed. The images were evaluated with
computer-aided detection.

[L MLO synth-2D]
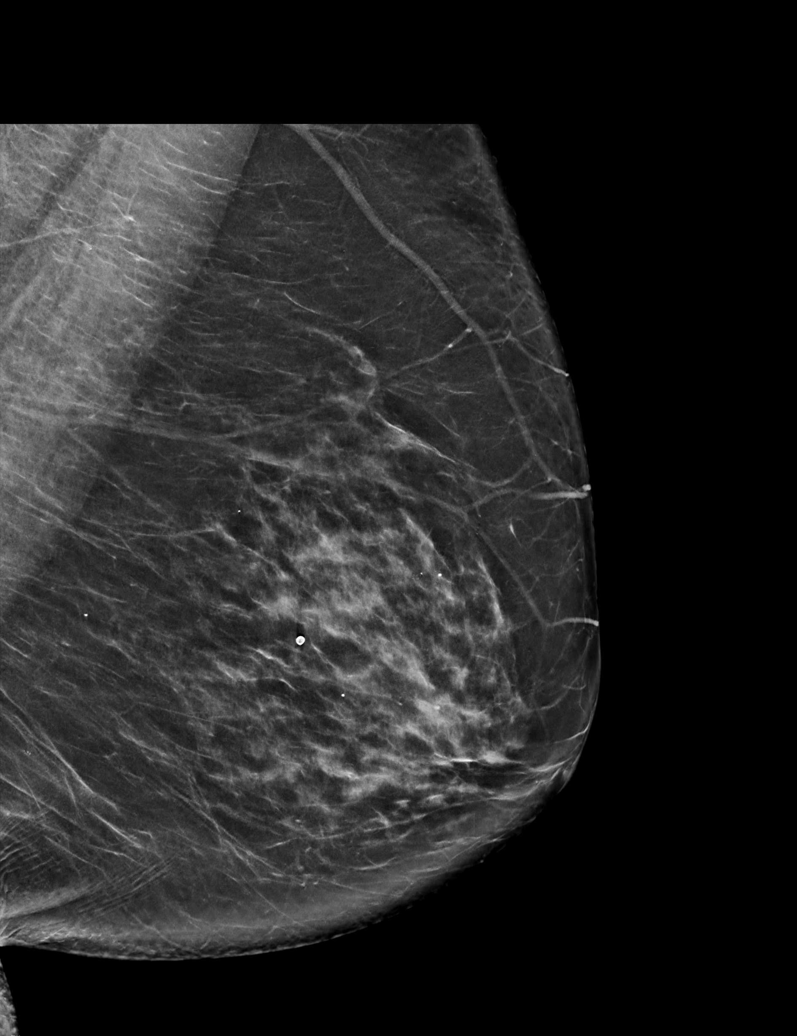

[L CC synth-2D]
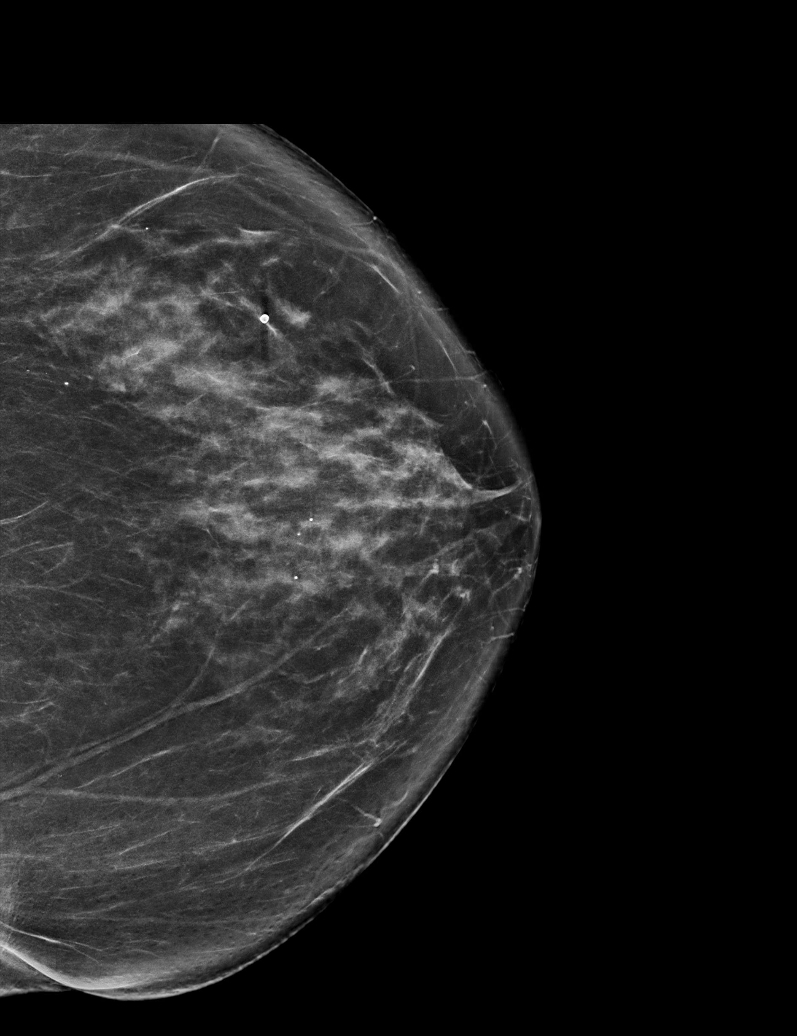

[L XCCL synth-2D]
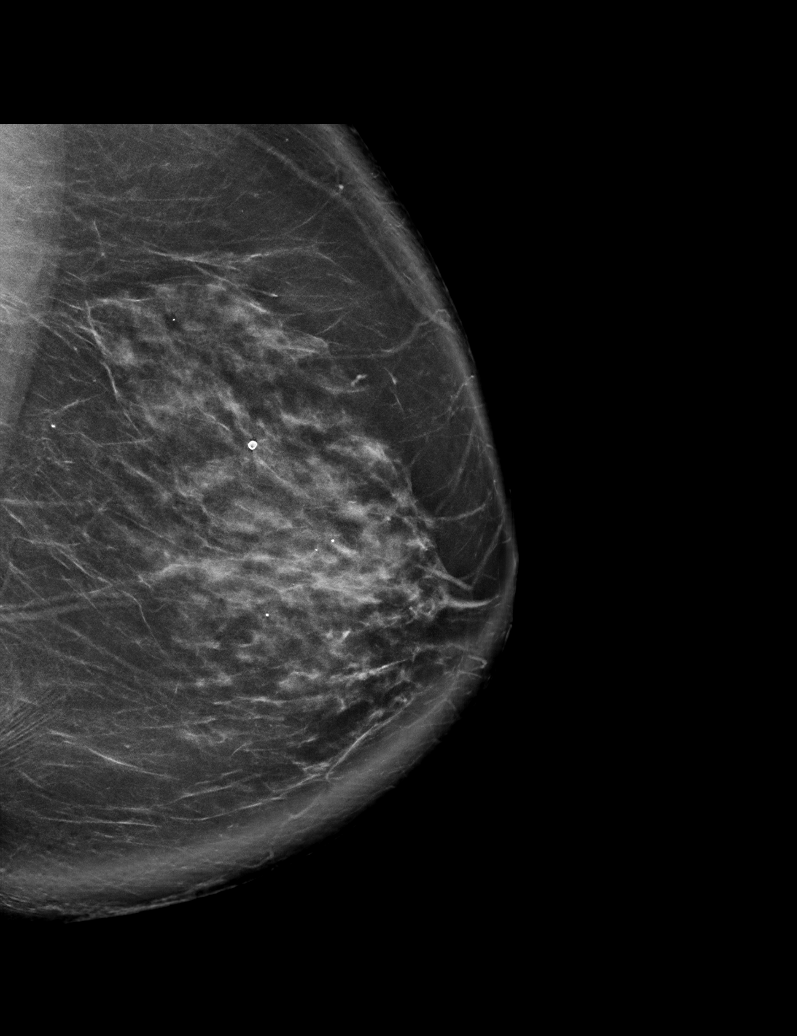

[L MLO tomo · tomo slice 42/83.0]
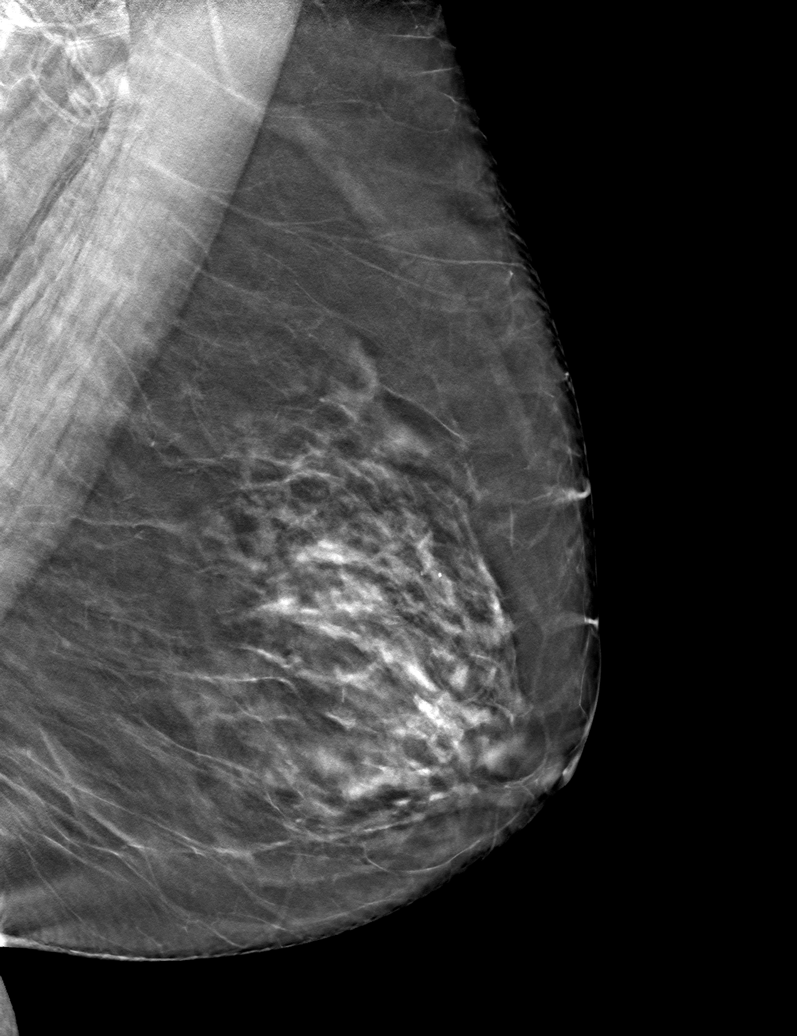

[L XCCL tomo · tomo slice 49/96.0]
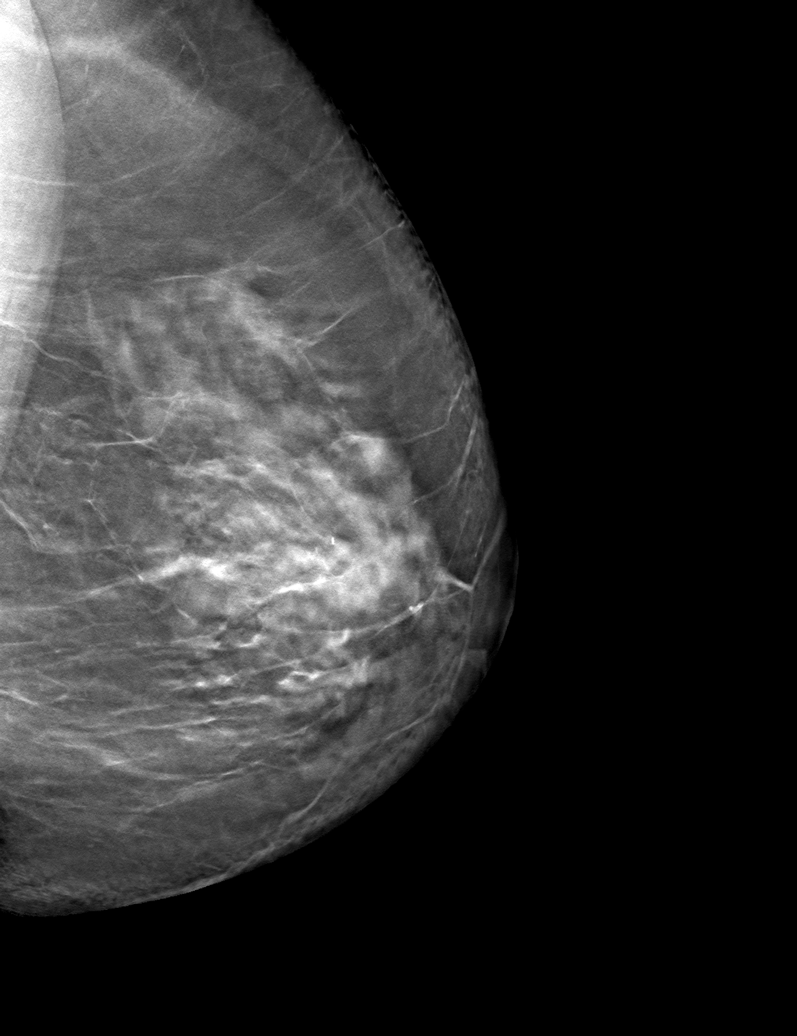

[L CC tomo · tomo slice 39/77.0]
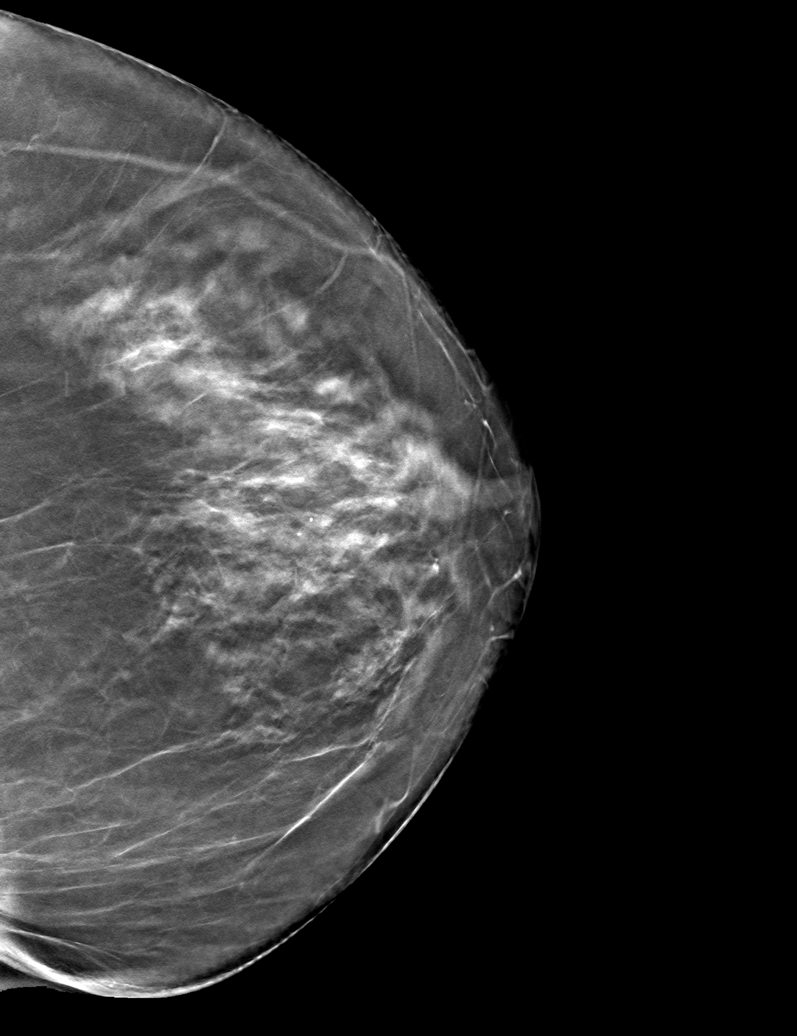

[6 of 18 positions shown; findings below may reference images not displayed]

ACR Breast Density Category c: The breast tissue is heterogeneously
dense, which may obscure small masses.
FINDINGS: The patient has had a right mastectomy. There are no findings
suspicious for malignancy.
IMPRESSION: No mammographic evidence of malignancy. A result letter of this
screening mammogram will be mailed directly to the patient.

RECOMMENDATION:
Screening mammogram in one year.  (Code:HB-T-2WB)

BI-RADS CATEGORY  1: Negative.

## 2023-03-11 ENCOUNTER — Ambulatory Visit (INDEPENDENT_AMBULATORY_CARE_PROVIDER_SITE_OTHER): Payer: Medicare Other

## 2023-03-11 VITALS — BP 113/69 | Temp 99.3°F | Ht 62.5 in | Wt 129.0 lb

## 2023-03-11 DIAGNOSIS — Z0000 Encounter for general adult medical examination without abnormal findings: Secondary | ICD-10-CM | POA: Diagnosis not present

## 2023-03-11 DIAGNOSIS — H9190 Unspecified hearing loss, unspecified ear: Secondary | ICD-10-CM

## 2023-03-11 NOTE — Progress Notes (Signed)
Subjective:   Gina Williams is a 78 y.o. female who presents for Medicare Annual (Subsequent) preventive examination.  Visit Complete: Virtual I connected with  Joette Catching on 03/11/23 by a audio enabled telemedicine application and verified that I am speaking with the correct person using two identifiers.  Patient Location: Home  Provider Location: Home Office  I discussed the limitations of evaluation and management by telemedicine. The patient expressed understanding and agreed to proceed.  Vital Signs: Because this visit was a virtual/telehealth visit, some criteria may be missing or patient reported. Any vitals not documented were not able to be obtained and vitals that have been documented are patient reported.  Patient Medicare AWV questionnaire was completed by the patient on 03/11/23; I have confirmed that all information answered by patient is correct and no changes since this date.  Cardiac Risk Factors include: advanced age (>19men, >6 women);dyslipidemia;sedentary lifestyle     Objective:    Today's Vitals   03/11/23 0901  BP: 113/69  Temp: 99.3 F (37.4 C)  SpO2: 95%  Weight: 129 lb (58.5 kg)  Height: 5' 2.5" (1.588 m)   Body mass index is 23.22 kg/m.     03/11/2023    9:24 AM 03/06/2022    9:58 AM 03/05/2021    9:52 AM 06/22/2018   10:06 AM 09/22/2017    1:39 PM 09/22/2017   12:11 PM 09/22/2017    6:25 AM  Advanced Directives  Does Patient Have a Medical Advance Directive? Yes Yes Yes No Yes Yes Yes  Type of Estate agent of [place];Living will  Healthcare Power of [place];Living will  Living will  Living will  Does patient want to make changes to medical advance directive?   Yes (MAU/Ambulatory/Procedural Areas - Information given)  No - Patient declined    Copy of Healthcare Power of Attorney in Chart? Yes - validated most recent copy scanned in chart (See row information)        Would patient like information on creating  a medical advance directive?    No - Patient declined       Current Medications (verified) Outpatient Encounter Medications as of 03/11/2023  Medication Sig   acetaminophen (TYLENOL) 500 MG tablet Take 500 mg by mouth every 6 (six) hours as needed for moderate pain or headache.    Cholecalciferol (VITAMIN D3 PO) Take 2,000 Units by mouth daily.    cyanocobalamin (VITAMIN B12) 1000 MCG tablet Take 1,000 mcg by mouth daily.   diphenhydrAMINE (BENADRYL) 25 mg capsule Take 25 mg by mouth at bedtime.   esomeprazole (NEXIUM) 40 MG capsule Take 1 capsule (40 mg total) by mouth daily.   fluticasone (FLONASE) 50 MCG/ACT nasal spray USE 2 SPRAY(S) IN EACH NOSTRIL ONCE DAILY AS NEEDED FOR  ALLERGIES  OR  RHINITIS   melatonin 5 MG TABS Take 5 mg by mouth at bedtime as needed.   Omega-3 Fatty Acids (FISH OIL PO) Take 1,900 mg by mouth daily.   Polyethyl Glycol-Propyl Glycol (SYSTANE) 0.4-0.3 % SOLN Apply to eye.   polyethylene glycol (MIRALAX / GLYCOLAX) 17 g packet Take 17 g by mouth daily.   No facility-administered encounter medications on file as of 03/11/2023.    Allergies (verified) Amoxicillin-pot clavulanate, Omeprazole, and Other   History: Past Medical History:  Diagnosis Date   Arthritis    hands   Breast cancer (HCC) 09/16/2007   Mastectomy   Dyspnea    last 5 months, lack of conditioning  GERD (gastroesophageal reflux disease)    Hypoglycemic reaction    fainted in her youth , hadnt eaten anything for hours and was assisting with a 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 BMD   Personal history of chemotherapy    PONV (postoperative nausea and vomiting)    SEVERE, patient is very concerned about this issue occurred with mastectomy sugery ; no issues whn she had hysterectomy    Vertigo 2007   Past Surgical History:  Procedure Laterality Date   ANTERIOR AND POSTERIOR REPAIR N/A 09/22/2017   Procedure:  CYSTOSCOPY ANTERIOR (CYSTOCELE);  Surgeon: Alfredo Martinez, MD;  Location: [facility];  Service: Urology;  Laterality: N/A;   BASAL CELL CARCINOMA EXCISION N/A 12/03/2020   CATARACT EXTRACTION, BILATERAL     COLONOSCOPY     CYSTOSCOPY N/A 10/23/2016   Procedure: CYSTOSCOPY;  Surgeon: Noland Fordyce, MD;  Location: [facility];  Service: Gynecology;  Laterality: N/A;   GANGLION CYST EXCISION Left 1970s   Multiple surgeries   HAND SURGERY Left 10/2005   Nerve damage   HAND SURGERY Left 2011   Torn ligament surgery   MASTECTOMY  08/2007   Breast Cancer   Nuclear Stress Test     Negative  EF 77%   pap  08/05/2014   [facility]   pessary  12/19/2015   [facility] Dr. Ernestina Penna   ROBOTIC ASSISTED TOTAL HYSTERECTOMY WITH BILATERAL SALPINGO OOPHERECTOMY Bilateral 10/23/2016   Procedure: ROBOTIC ASSISTED TOTAL HYSTERECTOMY WITH BILATERAL SALPINGO OOPHORECTOMY/Uterosacral Ligament Suspension;  Surgeon: Noland Fordyce, MD;  Location: [facility];  Service: Gynecology;  Laterality: Bilateral;   SHOULDER SURGERY Bilateral Late 1980s   Frozen shoulders bilaterally, no surgery, had physical therapy   Stress myoview  12/2003   Low risk   TONSILLECTOMY  Late 1970s   TUBAL LIGATION     VAGINAL PROLAPSE REPAIR N/A 09/22/2017   Procedure: VAGINAL VAULT SUSPENSION AND GRAFT;  Surgeon: Alfredo Martinez, MD;  Location: [facility];  Service: Urology;  Laterality: N/A;   YAG LASER APPLICATION Bilateral 04/24/2020   Family History  Problem Relation Age of Onset   Asthma Mother    Aortic aneurysm Mother    Stroke Father    Alzheimer's disease Father    Stroke Maternal Uncle    Breast cancer Paternal Aunt 16 - 31   Breast cancer Paternal Aunt 68 - 64   Breast cancer Paternal Aunt 58 - 25   Hypertension Other        HTN on Father's side of family   Social History   Socioeconomic History   Marital status: Single    Spouse name: Not on file   Number of children: Not on file   Years of education: Not on  file   Highest education level: Not on file  Occupational History   Not on file  Tobacco Use   Smoking status: Former    Current packs/day: 0.00    Types: Cigarettes    Quit date: 05/21/1998    Years since quitting: 24.8   Smokeless tobacco: Never  Vaping Use   Vaping status: Never Used  Substance and Sexual Activity   Alcohol use: No    Alcohol/week: 0.0 standard drinks of alcohol   Drug use: No   Sexual activity: Never    Birth control/protection: Post-menopausal  Other Topics Concern   Not on file  Social History Narrative  Not on file   Social Drivers of Health   Financial Resource Strain: Low Risk  (03/11/2023)   Overall Financial Resource Strain (CARDIA)    Difficulty of Paying Living Expenses: Not hard at all  Food Insecurity: No Food Insecurity (03/11/2023)   Hunger Vital Sign    Worried About Running Out of Food in the Last Year: Never true    Ran Out of Food in the Last Year: Never true  Transportation Needs: No Transportation Needs (03/11/2023)   PRAPARE - Administrator, Civil Service (Medical): No    Lack of Transportation (Non-Medical): No  Physical Activity: Inactive (03/11/2023)   Exercise Vital Sign    Days of Exercise per Week: 0 days    Minutes of Exercise per Session: 0 min  Stress: Stress Concern Present (03/11/2023)   [employer] of Occupational Health - Occupational Stress Questionnaire    Feeling of Stress : To some extent  Social Connections: Moderately Isolated (03/11/2023)   Social Connection and Isolation Panel [NHANES]    Frequency of Communication with Friends and Family: More than three times a week    Frequency of Social Gatherings with Friends and Family: Once a week    Attends Religious Services: More than 4 times per year    Active Member of [employer] or Organizations: No    Attends Banker Meetings: Never    Marital Status: Widowed    Tobacco Counseling Counseling given: Not Answered   Clinical  Intake:  Pre-visit preparation completed: Yes  Pain : No/denies pain   BMI - recorded: 23.22 Nutritional Status: BMI of 19-24  Normal Nutritional Risks: None Diabetes: No  How often do you need to have someone help you when you read instructions, pamphlets, or other written materials from your doctor or pharmacy?: 1 - Never  Interpreter Needed?: No  Comments: lives alone Information entered by :: B.Quenna Doepke,LPN   Activities of Daily Living    03/11/2023    9:24 AM  In your present state of health, do you have any difficulty performing the following activities:  Hearing? 1  Vision? 0  Difficulty concentrating or making decisions? 0  Walking or climbing stairs? 1  Dressing or bathing? 0  Doing errands, shopping? 0  Preparing Food and eating ? N  Using the Toilet? N  In the past six months, have you accidently leaked urine? Y  Do you have problems with loss of bowel control? N  Managing your Medications? N  Managing your Finances? N  Housekeeping or managing your Housekeeping? N    Patient Care Team: Tower, Audrie Gallus, MD as PCP - General (Family Medicine) Janet Berlin, MD as Consulting Physician (Ophthalmology) Noland Fordyce, MD as Consulting Physician (Obstetrics and Gynecology) Breck Coons, DDS as Referring Physician (Dentistry)  Indicate any recent Medical Services you may have received from other than [facility] providers in the past year (date may be approximate).     Assessment:   This is a routine wellness examination for [place].  Hearing/Vision screen Hearing Screening - Comments:: Pt says her hearing is lessened Audiology referral Vision Screening - Comments:: Pt says her vision is good but has blurred sometimes Dr Janet Berlin    Goals Addressed             This Visit's Progress    COMPLETED: DIET - INCREASE WATER INTAKE       Starting 06/19/2018, I will continue to drink at least 50-60 ounces of water daily.  Patient Stated        03/11/23-Would like to maintain current routine.     Patient Stated       03/11/23-Would like to start walking more.       Depression Screen    03/11/2023    9:15 AM 04/02/2022    9:17 AM 03/19/2022   11:33 AM 03/06/2022    9:54 AM 03/05/2021    9:55 AM 06/29/2019    9:02 AM 06/22/2018   10:06 AM  PHQ 2/9 Scores  PHQ - 2 Score 0 0 0 0 0 0 0  PHQ- 9 Score  3 6    0    Fall Risk    03/11/2023    9:08 AM 04/02/2022    9:17 AM 03/19/2022   11:32 AM 03/06/2022    9:59 AM 03/05/2021    9:54 AM  Fall Risk   Falls in the past year? 0 0 0 0 0  Number falls in past yr: 0 0 0 0 0  Injury with Fall? 0 0 0 0 0  Risk for fall due to : No Fall Risks No Fall Risks No Fall Risks No Fall Risks No Fall Risks  Follow up Education provided;Falls prevention discussed Falls evaluation completed Falls evaluation completed Falls prevention discussed;Falls evaluation completed Falls prevention discussed    MEDICARE RISK AT HOME: Medicare Risk at Home Any stairs in or around the home?: Yes (steps outside) If so, are there any without handrails?: Yes Home free of loose throw rugs in walkways, pet beds, electrical cords, etc?: Yes Adequate lighting in your home to reduce risk of falls?: Yes Life alert?: Yes Use of a cane, walker or w/c?: No Grab bars in the bathroom?: Yes Shower chair or bench in shower?: No Elevated toilet seat or a handicapped toilet?: Yes  TIMED UP AND GO:  Was the test performed?  No    Cognitive Function:    06/22/2018   10:32 AM 06/18/2017    8:39 AM 12/26/2015   10:38 AM  MMSE - Mini Mental State Exam  Orientation to time 5 5 5   Orientation to Place 5 5 5   Registration 3 3 3   Attention/ Calculation 0 0 0  Recall 3 3 3   Language- name 2 objects 0 0 0  Language- repeat 1 1 1   Language- follow 3 step command 0 3 3  Language- read & follow direction 0 0 0  Write a sentence 0 0 0  Copy design 0 0 0  Total score 17 20 20         03/11/2023    9:30 AM 03/06/2022   10:09 AM   6CIT Screen  What Year? 0 points 0 points  What month? 0 points 0 points  What time? 0 points 0 points  Count back from 20 0 points 0 points  Months in reverse 0 points 0 points  Repeat phrase 2 points 2 points  Total Score 2 points 2 points    Immunizations Immunization History  Administered Date(s) Administered   Influenza,inj,Quad PF,6+ Mos 11/18/2012   PFIZER(Purple Top)SARS-COV-2 Vaccination 03/18/2019, 04/12/2019, 01/17/2020   Td 06/29/2012, 01/23/2022    TDAP status: Up to date  Flu Vaccine status: Declined, Education has been provided regarding the importance of this vaccine but patient still declined. Advised may receive this vaccine at local pharmacy or Health Dept. Aware to provide a copy of the vaccination record if obtained from local pharmacy or Health Dept. Verbalized acceptance and understanding.  Pneumococcal  vaccine status: Declined,  Education has been provided regarding the importance of this vaccine but patient still declined. Advised may receive this vaccine at local pharmacy or Health Dept. Aware to provide a copy of the vaccination record if obtained from local pharmacy or Health Dept. Verbalized acceptance and understanding.   Covid-19 vaccine status: Completed vaccines  Qualifies for Shingles Vaccine? Yes   Zostavax completed No   Shingrix Completed?: No.    Education has been provided regarding the importance of this vaccine. Patient has been advised to call insurance company to determine out of pocket expense if they have not yet received this vaccine. Advised may also receive vaccine at local pharmacy or Health Dept. Verbalized acceptance and understanding.  Screening Tests Health Maintenance  Topic Date Due   Zoster Vaccines- Shingrix (1 of 2) Never done   Pneumonia Vaccine 23+ Years old (1 of 1 - PCV) Never done   COVID-19 Vaccine (4 - 2024-25 season) 09/21/2022   INFLUENZA VACCINE  04/20/2023 (Originally 08/21/2022)   MAMMOGRAM  02/06/2024    Medicare Annual Wellness (AWV)  03/10/2024   DTaP/Tdap/Td (3 - Tdap) 01/24/2032   DEXA SCAN  Completed   Hepatitis C Screening  Completed   HPV VACCINES  Aged Out   Colonoscopy  Discontinued    Health Maintenance  Health Maintenance Due  Topic Date Due   Zoster Vaccines- Shingrix (1 of 2) Never done   Pneumonia Vaccine 100+ Years old (1 of 1 - PCV) Never done   COVID-19 Vaccine (4 - 2024-25 season) 09/21/2022    Colorectal cancer screening: No longer required.   Mammogram status: No longer required due to age.  Bone Density status: Completed 02/02/2015. Results reflect: Bone density results: OSTEOPENIA. Repeat every 3-5 years.  Lung Cancer Screening: (Low Dose CT Chest recommended if Age 65-80 years, 20 pack-year currently smoking OR have quit w/in 15years.) does not qualify.   Lung Cancer Screening Referral: no  Additional Screening:  Hepatitis C Screening: does not qualify; Completed 12/26/2015  Vision Screening: Recommended annual ophthalmology exams for early detection of glaucoma and other disorders of the eye. Is the patient up to date with their annual eye exam?  Yes  Who is the provider or what is the name of the office in which the patient attends annual eye exams? Dr Janet Berlin If pt is not established with a provider, would they like to be referred to a provider to establish care? No .   Dental Screening: Recommended annual dental exams for proper oral hygiene  Diabetic Foot Exam:   Community Resource Referral / Chronic Care Management: CRR required this visit?  No   CCM required this visit?  No     Plan:     I have personally reviewed and noted the following in the patient's chart:   Medical and social history Use of alcohol, tobacco or illicit drugs  Current medications and supplements including opioid prescriptions. Patient is not currently taking opioid prescriptions. Functional ability and status Nutritional status Physical activity Advanced  directives List of other physicians Hospitalizations, surgeries, and ER visits in previous 12 months Vitals Screenings to include cognitive, depression, and falls Referrals and appointments  In addition, I have reviewed and discussed with patient certain preventive protocols, quality metrics, and best practice recommendations. A written personalized care plan for preventive services as well as general preventive health recommendations were provided to patient.     Sue Lush, LPN   1/61/0960   After Visit Summary: (Declined) Due  to this being a telephonic visit, with patients personalized plan was offered to patient but patient Declined AVS at this time   Nurse Notes: pt says she has been experiencing SOB with walking, and has no energy and feels tired all the time. She relays she has just gotten over NoroVirus and bowels are not totally back normal. She has appt next month with PCP. She relays she does not need to be seen prior to that. She has no other concerns or questions.

## 2023-03-11 NOTE — Patient Instructions (Addendum)
Gina Williams , Thank you for taking time to come for your Medicare Wellness Visit. I appreciate your ongoing commitment to your health goals. Please review the following plan we discussed and let me know if I can assist you in the future.   Referrals/Orders/Follow-Ups/Clinician Recommendations:   Patient complains of difficulty with hearing. ENT/Audiology referral placed. Patient is in agreement with treatment plan. Aware that the office will call with an appointment.    This is a list of the screening recommended for you and due dates:  Health Maintenance  Topic Date Due   Zoster (Shingles) Vaccine (1 of 2) Never done   Pneumonia Vaccine (1 of 1 - PCV) Never done   COVID-19 Vaccine (4 - 2024-25 season) 09/21/2022   Flu Shot  04/20/2023*   Mammogram  02/06/2024   Medicare Annual Wellness Visit  03/10/2024   DTaP/Tdap/Td vaccine (3 - Tdap) 01/24/2032   DEXA scan (bone density measurement)  Completed   Hepatitis C Screening  Completed   HPV Vaccine  Aged Out   Colon Cancer Screening  Discontinued  *Topic was postponed. The date shown is not the original due date.    Advanced directives: (In Chart) A copy of your advanced directives are scanned into your chart should your provider ever need it.  Next Medicare Annual Wellness Visit scheduled for next year: Yes 03/11/2024 @ 9:30am televisit

## 2023-03-19 DIAGNOSIS — K5900 Constipation, unspecified: Secondary | ICD-10-CM | POA: Diagnosis not present

## 2023-03-25 ENCOUNTER — Telehealth: Payer: Self-pay | Admitting: Family Medicine

## 2023-03-25 DIAGNOSIS — Z79899 Other long term (current) drug therapy: Secondary | ICD-10-CM

## 2023-03-25 DIAGNOSIS — R5382 Chronic fatigue, unspecified: Secondary | ICD-10-CM

## 2023-03-25 DIAGNOSIS — R7309 Other abnormal glucose: Secondary | ICD-10-CM

## 2023-03-25 DIAGNOSIS — E538 Deficiency of other specified B group vitamins: Secondary | ICD-10-CM

## 2023-03-25 DIAGNOSIS — E7800 Pure hypercholesterolemia, unspecified: Secondary | ICD-10-CM

## 2023-03-25 NOTE — Telephone Encounter (Signed)
-----   Message from Vincenza Hews sent at 03/16/2023  9:17 AM EST ----- Regarding: Labs Fri 03/27/23 Hello,   Patient has a lab appointment on Friday 03/27/23. Can we get lab orders please.   Thanks

## 2023-03-27 ENCOUNTER — Other Ambulatory Visit: Payer: Medicare Other

## 2023-03-27 DIAGNOSIS — Z79899 Other long term (current) drug therapy: Secondary | ICD-10-CM | POA: Diagnosis not present

## 2023-03-27 DIAGNOSIS — R5382 Chronic fatigue, unspecified: Secondary | ICD-10-CM

## 2023-03-27 DIAGNOSIS — R7309 Other abnormal glucose: Secondary | ICD-10-CM

## 2023-03-27 DIAGNOSIS — E7800 Pure hypercholesterolemia, unspecified: Secondary | ICD-10-CM

## 2023-03-27 DIAGNOSIS — E538 Deficiency of other specified B group vitamins: Secondary | ICD-10-CM | POA: Diagnosis not present

## 2023-03-27 LAB — CBC WITH DIFFERENTIAL/PLATELET
Basophils Absolute: 0.1 10*3/uL (ref 0.0–0.1)
Basophils Relative: 1 % (ref 0.0–3.0)
Eosinophils Absolute: 0.1 10*3/uL (ref 0.0–0.7)
Eosinophils Relative: 1.6 % (ref 0.0–5.0)
HCT: 43 % (ref 36.0–46.0)
Hemoglobin: 14.2 g/dL (ref 12.0–15.0)
Lymphocytes Relative: 22.9 % (ref 12.0–46.0)
Lymphs Abs: 2 10*3/uL (ref 0.7–4.0)
MCHC: 33 g/dL (ref 30.0–36.0)
MCV: 90.1 fl (ref 78.0–100.0)
Monocytes Absolute: 0.9 10*3/uL (ref 0.1–1.0)
Monocytes Relative: 10.2 % (ref 3.0–12.0)
Neutro Abs: 5.6 10*3/uL (ref 1.4–7.7)
Neutrophils Relative %: 64.3 % (ref 43.0–77.0)
Platelets: 319 10*3/uL (ref 150.0–400.0)
RBC: 4.77 Mil/uL (ref 3.87–5.11)
RDW: 13.1 % (ref 11.5–15.5)
WBC: 8.6 10*3/uL (ref 4.0–10.5)

## 2023-03-27 LAB — TSH: TSH: 1.09 u[IU]/mL (ref 0.35–5.50)

## 2023-03-27 LAB — COMPREHENSIVE METABOLIC PANEL
ALT: 9 U/L (ref 0–35)
AST: 12 U/L (ref 0–37)
Albumin: 4.3 g/dL (ref 3.5–5.2)
Alkaline Phosphatase: 67 U/L (ref 39–117)
BUN: 15 mg/dL (ref 6–23)
CO2: 26 meq/L (ref 19–32)
Calcium: 9.6 mg/dL (ref 8.4–10.5)
Chloride: 107 meq/L (ref 96–112)
Creatinine, Ser: 0.91 mg/dL (ref 0.40–1.20)
GFR: 60.49 mL/min (ref >60.00)
Glucose, Bld: 83 mg/dL (ref 70–99)
Potassium: 4.5 meq/L (ref 3.5–5.1)
Sodium: 143 meq/L (ref 135–145)
Total Bilirubin: 0.5 mg/dL (ref 0.2–1.2)
Total Protein: 6.8 g/dL (ref 6.0–8.3)

## 2023-03-27 LAB — LIPID PANEL
Cholesterol: 181 mg/dL (ref 0–200)
HDL: 45.4 mg/dL (ref >39.00)
LDL Cholesterol: 106 mg/dL — ABNORMAL HIGH (ref 0–99)
NonHDL: 136
Total CHOL/HDL Ratio: 4
Triglycerides: 148 mg/dL (ref 0.0–149.0)
VLDL: 29.6 mg/dL (ref 0.0–40.0)

## 2023-03-27 LAB — HEMOGLOBIN A1C: Hgb A1c MFr Bld: 5.9 % (ref 4.6–6.5)

## 2023-03-27 LAB — VITAMIN B12: Vitamin B-12: 737 pg/mL (ref 211–911)

## 2023-04-03 ENCOUNTER — Encounter: Payer: Self-pay | Admitting: Family Medicine

## 2023-04-03 ENCOUNTER — Ambulatory Visit: Payer: Medicare Other | Admitting: Family Medicine

## 2023-04-03 VITALS — BP 120/70 | HR 71 | Temp 97.8°F | Ht 63.0 in | Wt 132.0 lb

## 2023-04-03 DIAGNOSIS — K219 Gastro-esophageal reflux disease without esophagitis: Secondary | ICD-10-CM | POA: Diagnosis not present

## 2023-04-03 DIAGNOSIS — E7800 Pure hypercholesterolemia, unspecified: Secondary | ICD-10-CM | POA: Diagnosis not present

## 2023-04-03 DIAGNOSIS — R7303 Prediabetes: Secondary | ICD-10-CM

## 2023-04-03 DIAGNOSIS — I700 Atherosclerosis of aorta: Secondary | ICD-10-CM | POA: Diagnosis not present

## 2023-04-03 DIAGNOSIS — E538 Deficiency of other specified B group vitamins: Secondary | ICD-10-CM

## 2023-04-03 DIAGNOSIS — Z853 Personal history of malignant neoplasm of breast: Secondary | ICD-10-CM | POA: Diagnosis not present

## 2023-04-03 DIAGNOSIS — Z2882 Immunization not carried out because of caregiver refusal: Secondary | ICD-10-CM

## 2023-04-03 DIAGNOSIS — R7309 Other abnormal glucose: Secondary | ICD-10-CM

## 2023-04-03 DIAGNOSIS — M8589 Other specified disorders of bone density and structure, multiple sites: Secondary | ICD-10-CM | POA: Diagnosis not present

## 2023-04-03 DIAGNOSIS — R5382 Chronic fatigue, unspecified: Secondary | ICD-10-CM

## 2023-04-03 DIAGNOSIS — J301 Allergic rhinitis due to pollen: Secondary | ICD-10-CM

## 2023-04-03 DIAGNOSIS — Z79899 Other long term (current) drug therapy: Secondary | ICD-10-CM

## 2023-04-03 DIAGNOSIS — J309 Allergic rhinitis, unspecified: Secondary | ICD-10-CM | POA: Insufficient documentation

## 2023-04-03 MED ORDER — ESOMEPRAZOLE MAGNESIUM 40 MG PO CPDR: 40.0000 mg | DELAYED_RELEASE_CAPSULE | Freq: Every day | ORAL | 3 refills | Status: DC

## 2023-04-03 MED ORDER — FLUTICASONE PROPIONATE 50 MCG/ACT NA SUSP: NASAL | 3 refills | Status: AC

## 2023-04-03 NOTE — Assessment & Plan Note (Signed)
 Lab Results  Component Value Date   HGBA1C 5.9 03/27/2023   HGBA1C 6.0 03/26/2022   HGBA1C 5.9 03/19/2021   Stable disc imp of low glycemic diet and wt loss to prevent DM2

## 2023-04-03 NOTE — Assessment & Plan Note (Signed)
 Lab Results  Component Value Date   VITAMINB12 737 03/27/2023    Improved after round of shots (4) last summer  Now 1000 mcg daily over the counter

## 2023-04-03 NOTE — Assessment & Plan Note (Signed)
 Dexa 2017 Declines another one  No falls or fractures Discussed fall prevention, supplements and exercise for bone density

## 2023-04-03 NOTE — Assessment & Plan Note (Signed)
 Due to caregiving much of the time  Encouraged to work on self care when able

## 2023-04-03 NOTE — Assessment & Plan Note (Signed)
 Right mastectomy in past  Exam stable Mammo utd

## 2023-04-03 NOTE — Assessment & Plan Note (Signed)
 Lab Results  Component Value Date   VITAMINB12 737 03/27/2023   Supplementing  GFR stable Declines dexa Taking vit D

## 2023-04-03 NOTE — Assessment & Plan Note (Addendum)
 No symptoms Good blood pressure  Continue to watch lipids LDL is improved

## 2023-04-03 NOTE — Assessment & Plan Note (Signed)
 Disc goals for lipids and reasons to control them Rev last labs with pt Rev low sat fat diet in detail LDL down to 106 Commended on better eating

## 2023-04-03 NOTE — Assessment & Plan Note (Signed)
 Continues flonase  Stable In season can add zyrtec over the counter

## 2023-04-03 NOTE — Patient Instructions (Addendum)
 Stay active  Add some strength training to your routine, this is important for bone and brain health and can reduce your risk of falls and help your body use insulin properly and regulate weight  Light weights, exercise bands , and internet videos are a good way to start  Yoga (chair or regular), machines , floor exercises or a gym with machines are also good options   Continue to watch sugar and carbs in diet  Try to get most of your carbohydrates from produce (with the exception of white potatoes) and whole grains Eat less bread/pasta/rice/snack foods/cereals/sweets and other items from the middle of the grocery store (processed carbs)  I sent in your flonase and nexium   Take care of yourself

## 2023-04-03 NOTE — Assessment & Plan Note (Signed)
 Continues nexium  Watching B12 Encouraged strongly to take vit D GFR is normal   Encouraged to watch dietary triggers

## 2023-04-03 NOTE — Assessment & Plan Note (Signed)
 Continues to decline flu /pna/ rsv/shingrix  Discussed risks

## 2023-04-03 NOTE — Progress Notes (Signed)
 Subjective:    Patient ID: Gina Williams, female    DOB: 1945-05-05, 78 y.o.   MRN: [id number]  HPI Here for annual follow up of chronic medical problems   Wt Readings from Last 3 Encounters:  04/03/23 132 lb (59.9 kg)  03/11/23 129 lb (58.5 kg)  12/08/22 133 lb 12.8 oz (60.7 kg)   23.38 kg/m  Vitals:   04/03/23 0921  BP: 120/70  Pulse: 71  Temp: 97.8 F (36.6 C)  SpO2: 98%    Immunization History  Administered Date(s) Administered   Influenza,inj,Quad PF,6+ Mos 11/18/2012   PFIZER(Purple Top)SARS-COV-2 Vaccination 03/18/2019, 04/12/2019, 01/17/2020   Td 06/29/2012, 01/23/2022    There are no preventive care reminders to display for this patient.  Doing pretty good  Had noro virus once  Caring for brother who is not well -has been in and out of hospital - RSV and various illnesses    Declines imms for now   Mammogram 01/2023 Self breast exam- no lumps or changes   Gyn health Hysterectomy in past    Colon cancer screening  Colonoscopy 01/2013 , did schedule one in April / she wants to keep screening   Bone health  Dexa 2017 Declines another one  Osteopenia  Falls- none  Fractures-none  Supplements -vitamin D  Last vitamin D Lab Results  Component Value Date   VD25OH 31 05/07/2011    Exercise  Not a lot  Some walking  Taking care of people  Cares for animals  Some push ups    Mood    04/03/2023    9:31 AM 03/11/2023    9:15 AM 04/02/2022    9:17 AM 03/19/2022   11:33 AM 03/06/2022    9:54 AM  Depression screen PHQ 2/9  Decreased Interest 0 0 0 0 0  Down, Depressed, Hopeless 0 0 0 0 0  PHQ - 2 Score 0 0 0 0 0  Altered sleeping 2  2 3    Tired, decreased energy 0  1 3   Change in appetite 0  0 0   Feeling bad or failure about yourself  0  0 0   Trouble concentrating 0  0 0   Moving slowly or fidgety/restless 0  0 0   Suicidal thoughts 0  0 0   PHQ-9 Score 2  3 6    Difficult doing work/chores Not difficult at all  Not difficult  at all Not difficult at all    Nexium 40 mg daily for GERD  B12 deficiency dx last summer  Took 4 shots a week apart  Taking 1000 mcg daily now  Level is good   Lab Results  Component Value Date   VITAMINB12 737 03/27/2023    Hyperlipidemia Lab Results  Component Value Date   CHOL 181 03/27/2023   CHOL 189 03/26/2022   CHOL 171 03/19/2021   Lab Results  Component Value Date   HDL 45.40 03/27/2023   HDL 56.10 03/26/2022   HDL 44.20 03/19/2021   Lab Results  Component Value Date   LDLCALC 106 (H) 03/27/2023   LDLCALC 119 (H) 03/26/2022   LDLCALC 101 (H) 03/19/2021   Lab Results  Component Value Date   TRIG 148.0 03/27/2023   TRIG 67.0 03/26/2022   TRIG 128.0 03/19/2021   Lab Results  Component Value Date   CHOLHDL 4 03/27/2023   CHOLHDL 3 03/26/2022   CHOLHDL 4 03/19/2021   Lab Results  Component Value Date   LDLDIRECT 156.2 10/27/2012  LDL is down to 106  Last visit discussed diet change  Less candy   History of aortic atherosclerosis    History of elevated glucose level Lab Results  Component Value Date   HGBA1C 5.9 03/27/2023   HGBA1C 6.0 03/26/2022   HGBA1C 5.9 03/19/2021  No changes overall  Some potatoes  Less candy   Other labs Lab Results  Component Value Date   NA 143 03/27/2023   K 4.5 03/27/2023   CO2 26 03/27/2023   GLUCOSE 83 03/27/2023   BUN 15 03/27/2023   CREATININE 0.91 03/27/2023   CALCIUM 9.6 03/27/2023   GFR 60.49 03/27/2023   GFRNONAA >60 09/23/2017   Lab Results  Component Value Date   ALT 9 03/27/2023   AST 12 03/27/2023   ALKPHOS 67 03/27/2023   BILITOT 0.5 03/27/2023   Lab Results  Component Value Date   WBC 8.6 03/27/2023   HGB 14.2 03/27/2023   HCT 43.0 03/27/2023   MCV 90.1 03/27/2023   PLT 319.0 03/27/2023   Lab Results  Component Value Date   TSH 1.09 03/27/2023        Patient Active Problem List   Diagnosis Date Noted   Allergic rhinitis 04/03/2023   Constipation 06/24/2022    Vitamin B12 deficiency 06/24/2022   Current use of proton pump inhibitor 04/02/2022   Right hip pain 03/19/2022   Sleep disorder 03/19/2022   Fatigue 06/21/2019   Parent refuses immunizations 06/29/2018   Prediabetes 06/20/2018   Cystocele with prolapse 09/22/2017   S/P hysterectomy 10/23/2016   Atherosclerosis of aorta (HCC) 09/23/2016   Estrogen deficiency 12/01/2014   Osteopenia 11/28/2013   Hyperlipidemia 11/03/2012   GERD 01/23/2010   History of breast cancer 01/23/2010   Past Medical History:  Diagnosis Date   Arthritis    hands   Breast cancer (HCC) 09/16/2007   Mastectomy   Dyspnea    last 5 months, lack of conditioning   GERD (gastroesophageal reflux disease)    Hypoglycemic reaction    fainted in her youth , hadnt eaten anything for hours and was assisting with a 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 BMD   Personal history of chemotherapy    PONV (postoperative nausea and vomiting)    SEVERE, patient is very concerned about this issue occurred with mastectomy sugery ; no issues whn she had hysterectomy    Vertigo 2007   Past Surgical History:  Procedure Laterality Date   ANTERIOR AND POSTERIOR REPAIR N/A 09/22/2017   Procedure: CYSTOSCOPY ANTERIOR (CYSTOCELE);  Surgeon: Alfredo Martinez, MD;  Location: [facility];  Service: Urology;  Laterality: N/A;   BASAL CELL CARCINOMA EXCISION N/A 12/03/2020   CATARACT EXTRACTION, BILATERAL     COLONOSCOPY     CYSTOSCOPY N/A 10/23/2016   Procedure: CYSTOSCOPY;  Surgeon: Noland Fordyce, MD;  Location: [facility];  Service: Gynecology;  Laterality: N/A;   GANGLION CYST EXCISION Left 1970s   Multiple surgeries   HAND SURGERY Left 10/2005   Nerve damage   HAND SURGERY Left 2011   Torn ligament surgery   MASTECTOMY  08/2007   Breast Cancer   Nuclear Stress Test     Negative  EF 77%   pap  08/05/2014   [facility]   pessary  12/19/2015   [facility]  Dr. Ernestina Penna   ROBOTIC ASSISTED TOTAL HYSTERECTOMY WITH BILATERAL SALPINGO OOPHERECTOMY Bilateral 10/23/2016  Procedure: ROBOTIC ASSISTED TOTAL HYSTERECTOMY WITH BILATERAL SALPINGO OOPHORECTOMY/Uterosacral Ligament Suspension;  Surgeon: Noland Fordyce, MD;  Location: [facility];  Service: Gynecology;  Laterality: Bilateral;   SHOULDER SURGERY Bilateral Late 1980s   Frozen shoulders bilaterally, no surgery, had physical therapy   Stress myoview  12/2003   Low risk   TONSILLECTOMY  Late 1970s   TUBAL LIGATION     VAGINAL PROLAPSE REPAIR N/A 09/22/2017   Procedure: VAGINAL VAULT SUSPENSION AND GRAFT;  Surgeon: Alfredo Martinez, MD;  Location: [facility];  Service: Urology;  Laterality: N/A;   YAG LASER APPLICATION Bilateral 04/24/2020   Social History   Tobacco Use   Smoking status: Former    Current packs/day: 0.00    Types: Cigarettes    Quit date: 05/21/1998    Years since quitting: 24.8   Smokeless tobacco: Never  Vaping Use   Vaping status: Never Used  Substance Use Topics   Alcohol use: No    Alcohol/week: 0.0 standard drinks of alcohol   Drug use: No   Family History  Problem Relation Age of Onset   Asthma Mother    Aortic aneurysm Mother    Stroke Father    Alzheimer's disease Father    Stroke Maternal Uncle    Breast cancer Paternal Aunt 95 - 6   Breast cancer Paternal Aunt 26 - 22   Breast cancer Paternal Aunt 36 - 68   Hypertension Other        HTN on Father's side of family   Allergies  Allergen Reactions   Amoxicillin-Pot Clavulanate Nausea Only and Other (See Comments)    REACTION: nausea (tolerates amoxil) Has patient had a PCN reaction causing immediate rash, facial/tongue/throat swelling, SOB or lightheadedness with hypotension: No Has patient had a PCN reaction causing severe rash involving mucus membranes or skin necrosis: Unknown Has patient had a PCN reaction that required hospitalization: No Has patient had a PCN reaction occurring within the last 10  years: No If all of the above answers are "NO", then may proceed with Cephalosporin use.    Omeprazole Other (See Comments)    ineffective    Other Nausea And Vomiting and Other (See Comments)    Anesthesia-vomiting/dry heaves (up to ~12hrs after)   Current Outpatient Medications on File Prior to Visit  Medication Sig Dispense Refill   acetaminophen (TYLENOL) 500 MG tablet Take 500 mg by mouth every 6 (six) hours as needed for moderate pain or headache.      Cholecalciferol (VITAMIN D3 PO) Take 2,000 Units by mouth daily.      cyanocobalamin (VITAMIN B12) 1000 MCG tablet Take 1,000 mcg by mouth daily.     diphenhydrAMINE (BENADRYL) 25 mg capsule Take 25 mg by mouth at bedtime.     melatonin 5 MG TABS Take 5 mg by mouth at bedtime as needed.     Omega-3 Fatty Acids (FISH OIL PO) Take 1,900 mg by mouth daily.     Polyethyl Glycol-Propyl Glycol (SYSTANE) 0.4-0.3 % SOLN Apply to eye.     polyethylene glycol (MIRALAX / GLYCOLAX) 17 g packet Take 17 g by mouth daily.     No current facility-administered medications on file prior to visit.    Review of Systems  Constitutional:  Negative for activity change, appetite change, fatigue, fever and unexpected weight change.  HENT:  Negative for congestion, ear pain, rhinorrhea, sinus pressure and sore throat.   Eyes:  Negative for pain, redness and visual disturbance.  Respiratory:  Negative for cough, shortness  of breath and wheezing.   Cardiovascular:  Negative for chest pain and palpitations.  Gastrointestinal:  Negative for abdominal pain, blood in stool, constipation and diarrhea.  Endocrine: Negative for polydipsia and polyuria.  Genitourinary:  Negative for dysuria, frequency and urgency.  Musculoskeletal:  Positive for arthralgias. Negative for back pain and myalgias.  Skin:  Negative for pallor and rash.  Allergic/Immunologic: Negative for environmental allergies.  Neurological:  Negative for dizziness, syncope and headaches.   Hematological:  Negative for adenopathy. Does not bruise/bleed easily.  Psychiatric/Behavioral:  Negative for decreased concentration and dysphoric mood. The patient is not nervous/anxious.        Stressors  Little time for self care       Objective:   Physical Exam Constitutional:      General: She is not in acute distress.    Appearance: Normal appearance. She is well-developed. She is not ill-appearing or diaphoretic.  HENT:     Head: Normocephalic and atraumatic.     Right Ear: Tympanic membrane, ear canal and external ear normal.     Left Ear: Tympanic membrane, ear canal and external ear normal.     Nose: Nose normal. No congestion.     Mouth/Throat:     Mouth: Mucous membranes are moist.     Pharynx: Oropharynx is clear. No posterior oropharyngeal erythema.  Eyes:     General: No scleral icterus.    Extraocular Movements: Extraocular movements intact.     Conjunctiva/sclera: Conjunctivae normal.     Pupils: Pupils are equal, round, and reactive to light.  Neck:     Thyroid: No thyromegaly.     Vascular: No carotid bruit or JVD.  Cardiovascular:     Rate and Rhythm: Normal rate and regular rhythm.     Pulses: Normal pulses.     Heart sounds: Normal heart sounds.     No gallop.  Pulmonary:     Effort: Pulmonary effort is normal. No respiratory distress.     Breath sounds: Normal breath sounds. No wheezing.     Comments: Good air exch Chest:     Chest wall: No tenderness.  Abdominal:     General: Bowel sounds are normal. There is no distension or abdominal bruit.     Palpations: Abdomen is soft. There is no mass.     Tenderness: There is no abdominal tenderness.     Hernia: No hernia is present.  Genitourinary:    Comments: Left Breast exam: No mass, nodules, thickening, tenderness, bulging, retraction, inflamation, nipple discharge or skin changes noted.  No axillary or clavicular LA. Right mastectomy site appears normal w/o skin changes or masses       Musculoskeletal:        General: No tenderness. Normal range of motion.     Cervical back: Normal range of motion and neck supple. No rigidity. No muscular tenderness.     Right lower leg: No edema.     Left lower leg: No edema.     Comments: No kyphosis   Lymphadenopathy:     Cervical: No cervical adenopathy.  Skin:    General: Skin is warm and dry.     Coloration: Skin is not pale.     Findings: No erythema or rash.     Comments: Solar lentigines diffusely Scattered sks   Neurological:     Mental Status: She is alert. Mental status is at baseline.     Cranial Nerves: No cranial nerve deficit.     Motor: No  abnormal muscle tone.     Coordination: Coordination normal.     Gait: Gait normal.     Deep Tendon Reflexes: Reflexes are normal and symmetric. Reflexes normal.  Psychiatric:        Mood and Affect: Mood normal.        Cognition and Memory: Cognition and memory normal.           Assessment & Plan:   Problem List Items Addressed This Visit       Cardiovascular and Mediastinum   Atherosclerosis of aorta (HCC)   No symptoms Good blood pressure  Continue to watch lipids LDL is improved         Respiratory   Allergic rhinitis   Continues flonase  Stable In season can add zyrtec over the counter         Digestive   GERD   Continues nexium  Watching B12 Encouraged strongly to take vit D GFR is normal   Encouraged to watch dietary triggers       Relevant Medications   esomeprazole (NEXIUM) 40 MG capsule     Musculoskeletal and Integument   Osteopenia   Dexa 2017 Declines another one  No falls or fractures Discussed fall prevention, supplements and exercise for bone density          Other   Vitamin B12 deficiency   Lab Results  Component Value Date   VITAMINB12 737 03/27/2023    Improved after round of shots (4) last summer  Now 1000 mcg daily over the counter       Prediabetes   Lab Results  Component Value Date   HGBA1C 5.9  03/27/2023   HGBA1C 6.0 03/26/2022   HGBA1C 5.9 03/19/2021   Stable disc imp of low glycemic diet and wt loss to prevent DM2       Parent refuses immunizations   Continues to decline flu /pna/ rsv/shingrix  Discussed risks       Hyperlipidemia - Primary   Disc goals for lipids and reasons to control them Rev last labs with pt Rev low sat fat diet in detail LDL down to 106 Commended on better eating      History of breast cancer   Right mastectomy in past  Exam stable Mammo utd       Fatigue   Due to caregiving much of the time  Encouraged to work on self care when able       Current use of proton pump inhibitor   Lab Results  Component Value Date   VITAMINB12 737 03/27/2023   Supplementing  GFR stable Declines dexa Taking vit D

## 2023-04-09 ENCOUNTER — Ambulatory Visit: Payer: Medicare Other | Attending: Family Medicine | Admitting: Audiologist

## 2023-04-09 DIAGNOSIS — H903 Sensorineural hearing loss, bilateral: Secondary | ICD-10-CM | POA: Insufficient documentation

## 2023-04-09 NOTE — Procedures (Signed)
  Outpatient Aud[facility] [address] [place], [place]  [address] [phone]  AUDIOLOGICAL  EVALUATION  NAME: Gina Williams     DOB:   01-Jul-1945      MRN: [id number]                                                                                     DATE: 04/09/2023     REFERENT: Judy Pimple, MD STATUS: Outpatient DIAGNOSIS: Sensorineural Hearing Loss    History: Carrie was seen for an audiological evaluation due to a need for baseline hearing testing. Abriana has noticed some slight difficulty hearing.  Ceola denies pain, pressure, or tinnitus. Her mother used hearing aids as an older adult. In 2007 she had a severe episode of vertigo lasting two months. A cause was never determined. She now still gets dizzy going up in elevation. She had chemotherapy over a decade ago as well. Justina no history of hazardous noise exposure.  Medical history shows no additional risk for hearing loss.    Evaluation:  Otoscopy showed a clear view of the tympanic membranes, bilaterally Tympanometry results were consistent with normal middle ear function, bilaterally   Audiometric testing was completed using Conventional Audiometry techniques with insert earphones and supraural headphones. Test results are consistent with normal hearing 250-4kHz sloping to moderate loss at 6-8kHz bilaterally. Speech Recognition Thresholds were obtained at 20 dB HL in the right ear and at 20 dB HL in the left ear. Word Recognition Testing was completed at  40dB SL and Pyper scored 96% bilaterally    Results:  The test results were reviewed with Bonita Quin. Trey has essentially normal hearing. She has some loss at the very highest pitches we test, 6-8kHz. There is no relevant speech information that high. She does not need hearing aids. She can start having her hearing monitored every few years. There is no medical concern for a rapid progression.  Audiogram printed and provided to Farnam.     Recommendations: Audiometric testing recommended to monitor hearing loss for progression every few years, sooner if [employer] a change in hearing   24 minutes spent testing and counseling on results.   If you have any questions please feel free to contact me at [phone].  Ammie Ferrier Stalnaker Au.D.  Audiologist   04/09/2023  2:20 PM  Cc: Tower, Audrie Gallus, MD

## 2023-04-17 ENCOUNTER — Other Ambulatory Visit (HOSPITAL_COMMUNITY): Payer: Self-pay

## 2023-05-04 DIAGNOSIS — Z1211 Encounter for screening for malignant neoplasm of colon: Secondary | ICD-10-CM | POA: Diagnosis not present

## 2023-05-04 DIAGNOSIS — K5730 Diverticulosis of large intestine without perforation or abscess without bleeding: Secondary | ICD-10-CM | POA: Diagnosis not present

## 2023-05-04 DIAGNOSIS — D125 Benign neoplasm of sigmoid colon: Secondary | ICD-10-CM | POA: Diagnosis not present

## 2023-05-04 LAB — HM COLONOSCOPY

## 2023-05-06 DIAGNOSIS — D125 Benign neoplasm of sigmoid colon: Secondary | ICD-10-CM | POA: Diagnosis not present

## 2023-06-04 DIAGNOSIS — H524 Presbyopia: Secondary | ICD-10-CM | POA: Diagnosis not present

## 2023-06-04 DIAGNOSIS — H43813 Vitreous degeneration, bilateral: Secondary | ICD-10-CM | POA: Diagnosis not present

## 2023-06-04 DIAGNOSIS — H52203 Unspecified astigmatism, bilateral: Secondary | ICD-10-CM | POA: Diagnosis not present

## 2023-06-04 DIAGNOSIS — H04123 Dry eye syndrome of bilateral lacrimal glands: Secondary | ICD-10-CM | POA: Diagnosis not present

## 2023-06-04 DIAGNOSIS — H353132 Nonexudative age-related macular degeneration, bilateral, intermediate dry stage: Secondary | ICD-10-CM | POA: Diagnosis not present

## 2023-06-04 DIAGNOSIS — Z961 Presence of intraocular lens: Secondary | ICD-10-CM | POA: Diagnosis not present

## 2023-07-15 ENCOUNTER — Ambulatory Visit: Payer: Self-pay

## 2023-07-15 NOTE — Telephone Encounter (Signed)
 FYI Only or Action Required?: FYI only for provider.  Patient was last seen in primary care on 04/03/2023 by Randeen Laine LABOR, MD. Called Nurse Triage reporting Chest Pain and Arm Pain. Symptoms began about a month ago. Interventions attempted: Nothing. Symptoms are: unchanged.  Triage Disposition: See PCP When Office is Open (Within 3 Days)  Patient/caregiver understands and will follow disposition?:   Copied from CRM [id number]. Topic: Clinical - Red Word Triage >> Jul 15, 2023  4:11 PM Viola FALCON wrote: Red Word that prompted transfer to Nurse Triage: Patient having pain under rib cage and underneath arm pit, requesting an appointment Reason for Disposition  [1] MODERATE pain (e.g., interferes with normal activities) AND [2] present > 3 days  Answer Assessment - Initial Assessment Questions 1. ONSET: When did the pain start?     One month 2. LOCATION: Where is the pain located?     Underneath right shoulder, rib/armpit area 3. PAIN: How bad is the pain? (Scale 1-10; or mild, moderate, severe)   - MILD (1-3): doesn't interfere with normal activities   - MODERATE (4-7): interferes with normal activities (e.g., work or school) or awakens from sleep   - SEVERE (8-10): excruciating pain, unable to do any normal activities, unable to move arm at all due to pain     7/10 at worse when reaching, otherwise does not hurt 4. WORK OR EXERCISE: Has there been any recent work or exercise that involved this part of the body?     Possibly from exercise or reaching into cabinet 5. CAUSE: What do you think is causing the shoulder pain?     Possibly pulled muscle  Protocols used: Shoulder Pain-A-AH

## 2023-07-15 NOTE — Telephone Encounter (Signed)
 Will see patient then Agree with ER and UC precautions

## 2023-07-15 NOTE — Telephone Encounter (Signed)
 Triaged scheduled appt for 07/17/23

## 2023-07-17 ENCOUNTER — Ambulatory Visit (INDEPENDENT_AMBULATORY_CARE_PROVIDER_SITE_OTHER): Admitting: Family Medicine

## 2023-07-17 ENCOUNTER — Ambulatory Visit: Payer: Self-pay | Admitting: Family Medicine

## 2023-07-17 ENCOUNTER — Ambulatory Visit (INDEPENDENT_AMBULATORY_CARE_PROVIDER_SITE_OTHER)
Admission: RE | Admit: 2023-07-17 | Discharge: 2023-07-17 | Disposition: A | Discharge status: Home / Self Care | Source: Ambulatory Visit | Attending: Family Medicine | Admitting: Family Medicine

## 2023-07-17 ENCOUNTER — Encounter: Payer: Self-pay | Admitting: Family Medicine

## 2023-07-17 VITALS — BP 128/70 | HR 72 | Temp 97.7°F | Ht 63.0 in | Wt 133.5 lb

## 2023-07-17 DIAGNOSIS — M79621 Pain in right upper arm: Secondary | ICD-10-CM

## 2023-07-17 NOTE — Patient Instructions (Signed)
 Try a warm compress on the painful area  If very bothersome you can also try a salon pas patch over the counter as needed   If this worsens let us  know   Watch for  Rash  Cough Fever  Any new symptoms   Xray of ribs and chest now  We will reach out with result   For any severe symptom -go to the ER

## 2023-07-17 NOTE — Assessment & Plan Note (Addendum)
 Discomfort in right axilla worsened by abducting arm , with rib tendenress in pt with history of mastectomy on that side  Reassuring exam /no lump / crepitus /step off or rash  Pain is positional  Some tenderness in lateral ribs (mild)   Last mammo in jan of left breast was normal  Rib and cxr ordered : reassuring with no lung or bony abnormalities   Muscle strain and costochondritis is in the differential  Discussed use of heat / taking deep breaths , analgesics prn  Will monitor closely  Watch for cough , rash or other new symptoms  Update if not starting to improve in a week or if worsening   Call back and Er precautions noted in detail today

## 2023-07-17 NOTE — Progress Notes (Unsigned)
 Subjective:    Patient ID: Gina Williams, female    DOB: [birth year], 78 y.o.   MRN: [id number]  HPI  Wt Readings from Last 3 Encounters:  07/17/23 133 lb 8 oz (60.6 kg)  04/03/23 132 lb (59.9 kg)  03/11/23 129 lb (58.5 kg)   23.65 kg/m  Vitals:   07/17/23 0920  BP: 128/70  Pulse: 72  Temp: 97.7 F (36.5 C)  SpO2: 97%    Pt presents for right sided pain under her arm for about a month   Under arm Over rib cage  Wondered if it was a a pulled muscle   Feels a popping sensation when she abducts her arm or reaches laterally (but not above her head)  It is painful when that happens  Moving back and neck does not flare Fine to lie in bed at night   No skin change at all No lumps in area of breast   Mastactomy site does not look or feel different    No new activity  No trauma  Does vacuum a lot     Last mammogram was 01/2023  Normal report Personal history of breast cancer with right mastectomy   Has not had shingrix vaccine    Imaging today  DG Ribs Unilateral W/Chest Right Result Date: 07/17/2023 CLINICAL DATA:  Right axillary pain. EXAM: RIGHT RIBS AND CHEST - 3+ VIEW COMPARISON:  September 23, 2016. FINDINGS: No fracture or other bone lesions are seen involving the ribs. There is no evidence of pneumothorax or pleural effusion. Both lungs are clear. Heart size and mediastinal contours are within normal limits. IMPRESSION: Negative. Electronically Signed   By: Lynwood Landy Raddle M.D.   On: 07/17/2023 10:23      Patient Active Problem List   Diagnosis Date Noted   Axillary pain, right 07/17/2023   Allergic rhinitis 04/03/2023   Constipation 06/24/2022   Vitamin B12 deficiency 06/24/2022   Current use of proton pump inhibitor 04/02/2022   Right hip pain 03/19/2022   Sleep disorder 03/19/2022   Fatigue 06/21/2019   Parent refuses immunizations 06/29/2018   Prediabetes 06/20/2018   Cystocele with prolapse 09/22/2017   S/P hysterectomy 10/23/2016    Atherosclerosis of aorta (HCC) 09/23/2016   Estrogen deficiency 12/01/2014   Osteopenia 11/28/2013   Hyperlipidemia 11/03/2012   GERD 01/23/2010   History of breast cancer 01/23/2010   Past Medical History:  Diagnosis Date   Arthritis    hands   Breast cancer (HCC) 09/16/2007   Mastectomy   Dyspnea    last 5 months, lack of conditioning   GERD (gastroesophageal reflux disease)    Hypoglycemic reaction    fainted in her youth , hadnt eaten anything for hours and was assisting with a 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 BMD   Personal history of chemotherapy    PONV (postoperative nausea and vomiting)    SEVERE, patient is very concerned about this issue occurred with mastectomy sugery ; no issues whn she had hysterectomy    Vertigo 2007   Past Surgical History:  Procedure Laterality Date   ANTERIOR AND POSTERIOR REPAIR N/A 09/22/2017   Procedure: CYSTOSCOPY ANTERIOR (CYSTOCELE);  Surgeon: Gaston Hamilton, MD;  Location: [facility];  Service: Urology;  Laterality: N/A;   BASAL CELL CARCINOMA EXCISION N/A 12/03/2020   CATARACT EXTRACTION, BILATERAL     COLONOSCOPY  CYSTOSCOPY N/A 10/23/2016   Procedure: CYSTOSCOPY;  Surgeon: Kandyce Sor, MD;  Location: [facility];  Service: Gynecology;  Laterality: N/A;   GANGLION CYST EXCISION Left 1970s   Multiple surgeries   HAND SURGERY Left 10/2005   Nerve damage   HAND SURGERY Left 2011   Torn ligament surgery   MASTECTOMY  08/2007   Breast Cancer   Nuclear Stress Test     Negative  EF 77%   pap  08/05/2014   [facility]   pessary  12/19/2015   [facility] Dr. Kandyce   ROBOTIC ASSISTED TOTAL HYSTERECTOMY WITH BILATERAL SALPINGO OOPHERECTOMY Bilateral 10/23/2016   Procedure: ROBOTIC ASSISTED TOTAL HYSTERECTOMY WITH BILATERAL SALPINGO OOPHORECTOMY/Uterosacral Ligament Suspension;  Surgeon: Kandyce Sor, MD;  Location: [facility];  Service: Gynecology;   Laterality: Bilateral;   SHOULDER SURGERY Bilateral Late 1980s   Frozen shoulders bilaterally, no surgery, had physical therapy   Stress myoview  12/2003   Low risk   TONSILLECTOMY  Late 1970s   TUBAL LIGATION     VAGINAL PROLAPSE REPAIR N/A 09/22/2017   Procedure: VAGINAL VAULT SUSPENSION AND GRAFT;  Surgeon: Gaston Hamilton, MD;  Location: [facility];  Service: Urology;  Laterality: N/A;   YAG LASER APPLICATION Bilateral 04/24/2020   Social History   Tobacco Use   Smoking status: Former    Current packs/day: 0.00    Types: Cigarettes    Quit date: 05/21/1998    Years since quitting: 25.1   Smokeless tobacco: Never  Vaping Use   Vaping status: Never Used  Substance Use Topics   Alcohol use: No    Alcohol/week: 0.0 standard drinks of alcohol   Drug use: No   Family History  Problem Relation Age of Onset   Asthma Mother    Aortic aneurysm Mother    Stroke Father    Alzheimer's disease Father    Stroke Maternal Uncle    Breast cancer Paternal Aunt 48 - 62   Breast cancer Paternal Aunt 12 - 32   Breast cancer Paternal Aunt 26 - 59   Hypertension Other        HTN on Father's side of family   Allergies  Allergen Reactions   Amoxicillin -Pot Clavulanate Nausea Only and Other (See Comments)    REACTION: nausea (tolerates amoxil ) Has patient had a PCN reaction causing immediate rash, facial/tongue/throat swelling, SOB or lightheadedness with hypotension: No Has patient had a PCN reaction causing severe rash involving mucus membranes or skin necrosis: Unknown Has patient had a PCN reaction that required hospitalization: No Has patient had a PCN reaction occurring within the last 10 years: No If all of the above answers are NO, then may proceed with Cephalosporin use.    Omeprazole Other (See Comments)    ineffective    Other Nausea And Vomiting and Other (See Comments)    Anesthesia-vomiting/dry heaves (up to ~12hrs after)   Current Outpatient Medications on File Prior to  Visit  Medication Sig Dispense Refill   acetaminophen  (TYLENOL ) 500 MG tablet Take 500 mg by mouth every 6 (six) hours as needed for moderate pain or headache.      Cholecalciferol (VITAMIN D3 PO) Take 2,000 Units by mouth daily.      cyanocobalamin  (VITAMIN B12) 1000 MCG tablet Take 1,000 mcg by mouth daily.     diphenhydrAMINE (BENADRYL) 25 mg capsule Take 25 mg by mouth at bedtime.     esomeprazole  (NEXIUM ) 40 MG capsule Take 1 capsule (40 mg total) by mouth daily. 90 capsule  3   fluticasone  (FLONASE ) 50 MCG/ACT nasal spray USE 2 SPRAY(S) IN EACH NOSTRIL ONCE DAILY AS NEEDED FOR  ALLERGIES  OR  RHINITIS 48 g 3   melatonin 5 MG TABS Take 5 mg by mouth at bedtime as needed.     Omega-3 Fatty Acids (FISH OIL PO) Take 1,900 mg by mouth daily.     Polyethyl Glycol-Propyl Glycol (SYSTANE) 0.4-0.3 % SOLN Apply to eye.     polyethylene glycol (MIRALAX / GLYCOLAX) 17 g packet Take 17 g by mouth daily.     No current facility-administered medications on file prior to visit.    Review of Systems     Objective:   Physical Exam Constitutional:      General: She is not in acute distress.    Appearance: Normal appearance. She is normal weight. She is not ill-appearing or diaphoretic.  Pulmonary:     Comments: Mild tenderness of ribs just below axilla  No crepitus or step off or skin change    Genitourinary:    Comments: Right mastectomy site No lumps or skin changes   Left breast - Breast exam: No mass, nodules, thickening, tenderness, bulging, retraction, inflamation, nipple discharge or skin changes noted.  No axillary or clavicular LA.      Musculoskeletal:     Cervical back: Normal range of motion and neck supple.     Comments: Normal rom of RUE Axillary pain worsens with abduction to 90 deg and flexion   Normal rom shoulder Neg hawking and neer tests No acromion tenderness  Lymphadenopathy:     Cervical: No cervical adenopathy.     Right cervical: No superficial, deep or  posterior cervical adenopathy.    Left cervical: No superficial, deep or posterior cervical adenopathy.     Upper Body:     Right upper body: No supraclavicular, axillary or pectoral adenopathy.     Left upper body: No supraclavicular, axillary or pectoral adenopathy.   Neurological:     Mental Status: She is alert.           Assessment & Plan:   Problem List Items Addressed This Visit       Other   Axillary pain, right - Primary   Discomfort in right axilla worsened by abducting arm , with rib tendenress in pt with history of mastectomy on that side  Reassuring exam /no lump / crepitus /step off or rash  Pain is positional  Some tenderness in lateral ribs (mild)   Last mammo in jan of left breast was normal  Rib and cxr ordered : reassuring with no lung or bony abnormalities   Muscle strain and costochondritis is in the differential  Discussed use of heat / taking deep breaths , analgesics prn  Will monitor closely  Watch for cough , rash or other new symptoms  Update if not starting to improve in a week or if worsening   Call back and Er precautions noted in detail today        Relevant Orders   DG Ribs Unilateral W/Chest Right (Completed)

## 2023-07-31 ENCOUNTER — Other Ambulatory Visit: Payer: Self-pay

## 2023-07-31 ENCOUNTER — Emergency Department
Admission: EM | Admit: 2023-07-31 | Discharge: 2023-08-01 | Disposition: A | Discharge status: Home / Self Care | Attending: Emergency Medicine | Admitting: Emergency Medicine

## 2023-07-31 ENCOUNTER — Emergency Department

## 2023-07-31 DIAGNOSIS — R935 Abnormal findings on diagnostic imaging of other abdominal regions, including retroperitoneum: Secondary | ICD-10-CM | POA: Diagnosis not present

## 2023-07-31 DIAGNOSIS — Z853 Personal history of malignant neoplasm of breast: Secondary | ICD-10-CM | POA: Diagnosis not present

## 2023-07-31 DIAGNOSIS — K449 Diaphragmatic hernia without obstruction or gangrene: Secondary | ICD-10-CM | POA: Insufficient documentation

## 2023-07-31 DIAGNOSIS — R079 Chest pain, unspecified: Secondary | ICD-10-CM | POA: Diagnosis not present

## 2023-07-31 DIAGNOSIS — R0789 Other chest pain: Secondary | ICD-10-CM | POA: Diagnosis not present

## 2023-07-31 DIAGNOSIS — R1013 Epigastric pain: Secondary | ICD-10-CM | POA: Diagnosis not present

## 2023-07-31 LAB — CBC
HCT: 44.2 % (ref 36.0–46.0)
Hemoglobin: 14 g/dL (ref 12.0–15.0)
MCH: 28.2 pg (ref 26.0–34.0)
MCHC: 31.7 g/dL (ref 30.0–36.0)
MCV: 88.9 fL (ref 80.0–100.0)
Platelets: 274 K/uL (ref 150–400)
RBC: 4.97 MIL/uL (ref 3.87–5.11)
RDW: 12.7 % (ref 11.5–15.5)
WBC: 8.4 K/uL (ref 4.0–10.5)
nRBC: 0 % (ref 0.0–0.2)

## 2023-07-31 LAB — BASIC METABOLIC PANEL WITH GFR
Anion gap: 9 (ref 5–15)
BUN: 19 mg/dL (ref 8–23)
CO2: 21 mmol/L — ABNORMAL LOW (ref 22–32)
Calcium: 9.2 mg/dL (ref 8.9–10.3)
Chloride: 110 mmol/L (ref 98–111)
Creatinine, Ser: 0.67 mg/dL (ref 0.44–1.00)
GFR, Estimated: >60 mL/min (ref >60)
Glucose, Bld: 91 mg/dL (ref 70–99)
Potassium: 3.9 mmol/L (ref 3.5–5.1)
Sodium: 140 mmol/L (ref 135–145)

## 2023-07-31 LAB — TROPONIN I (HIGH SENSITIVITY)
Troponin I (High Sensitivity): 5 ng/L (ref <18)
Troponin I (High Sensitivity): 6 ng/L (ref <18)

## 2023-07-31 LAB — LIPASE, BLOOD: Lipase: 33 U/L (ref 11–51)

## 2023-07-31 MED ORDER — IOHEXOL 300 MG/ML  SOLN
100.0000 mL | Freq: Once | INTRAMUSCULAR | Status: AC | PRN
  Administered 2023-07-31: 100 mL via INTRAVENOUS

## 2023-07-31 NOTE — ED Provider Notes (Signed)
 [facility] Provider Note    Event Date/Time   First MD Initiated Contact with Patient 07/31/23 2334     (approximate)   History   Chief Complaint Chest Pain   HPI  Gina Williams is a 78 y.o. female with past medical history of hyperlipidemia, GERD, and breast cancer who presents to the ED complaining of chest pain.  Patient reports that she was visiting her brother upstairs in the hospital when she suddenly had sharp stabbing pain in the center of her chest.  She states that the pain radiated towards her back, was present for about 20 minutes before resolving on its own.  She denies any associated difficulty breathing, did not have any nausea or vomiting.  Since arriving to the ED, her symptoms have resolved and she states she feels back to normal.     Physical Exam   Triage Vital Signs: ED Triage Vitals  Encounter Vitals Group     BP 07/31/23 1718 (!) 122/54     Girls Systolic BP Percentile --      Girls Diastolic BP Percentile --      Boys Systolic BP Percentile --      Boys Diastolic BP Percentile --      Pulse Rate 07/31/23 1718 (!) 57     Resp 07/31/23 1718 16     Temp 07/31/23 1718 97.7 F (36.5 C)     Temp Source 07/31/23 1718 Oral     SpO2 07/31/23 1718 95 %     Weight 07/31/23 1719 130 lb (59 kg)     Height 07/31/23 1719 5' 3 (1.6 m)     Head Circumference --      Peak Flow --      Pain Score 07/31/23 1718 2     Pain Loc --      Pain Education --      Exclude from Growth Chart --     Most recent vital signs: Vitals:   07/31/23 1718 07/31/23 2110  BP: (!) 122/54 (!) 157/68  Pulse: (!) 57 77  Resp: 16 17  Temp: 97.7 F (36.5 C) 98.1 F (36.7 C)  SpO2: 95% 98%    Constitutional: Alert and oriented. Eyes: Conjunctivae are normal. Head: Atraumatic. Nose: No congestion/rhinnorhea. Mouth/Throat: Mucous membranes are moist.  Cardiovascular: Normal rate, regular rhythm. Grossly normal heart sounds.  2+ radial pulses  bilaterally. Respiratory: Normal respiratory effort.  No retractions. Lungs CTAB. Gastrointestinal: Soft and nontender. No distention. Musculoskeletal: No lower extremity tenderness nor edema.  Neurologic:  Normal speech and language. No gross focal neurologic deficits are appreciated.    ED Results / Procedures / Treatments   Labs (all labs ordered are listed, but only abnormal results are displayed) Labs Reviewed  BASIC METABOLIC PANEL WITH GFR - Abnormal; Notable for the following components:      Result Value   CO2 21 (*)    All other components within normal limits  CBC  LIPASE, BLOOD  HEPATIC FUNCTION PANEL  TROPONIN I (HIGH SENSITIVITY)  TROPONIN I (HIGH SENSITIVITY)     EKG  ED ECG REPORT I, Carlin Palin, the attending physician, personally viewed and interpreted this ECG.   Date: 07/31/2023  EKG Time: 17:24  Rate: 79  Rhythm: normal sinus rhythm  Axis: Normal  Intervals:left bundle branch block  ST&T Change: None  RADIOLOGY CXR reviewed and interpreted by me with no infiltrate, edema, or effusion.  PROCEDURES:  Critical Care performed: No  Procedures  MEDICATIONS ORDERED IN ED: Medications  iohexol  (OMNIPAQUE ) 300 MG/ML solution 100 mL (100 mLs Intravenous Contrast Given 07/31/23 1814)     IMPRESSION / MDM / ASSESSMENT AND PLAN / ED COURSE  I reviewed the triage vital signs and the nursing notes.                              78 y.o. female with past medical history of hyperlipidemia, GERD, and breast cancer who presents to the ED complaining of 20 minutes of sharp stabbing pain in her chest rating towards her back that has since resolved.  Patient's presentation is most consistent with acute presentation with potential threat to life or bodily function.  Differential diagnosis includes, but is not limited to, ACS, PE, dissection, pneumonia, pneumothorax, musculoskeletal pain, GERD, anxiety.  Patient nontoxic-appearing and in no acute  distress, vital signs are unremarkable.  EKG shows no evidence of arrhythmia or ischemia and 2 sets of troponin are negative, doubt ACS.  With resolution of her symptoms, I doubt PE or dissection.  Chest x-ray is unremarkable, CT imaging was performed of her abdomen/pelvis which shows small hiatal hernia, otherwise unremarkable.  Labs without significant anemia, leukocytosis, electrolyte abnormality, or AKI.  LFTs and lipase are unremarkable.  Suspect symptoms due to hiatal hernia and GERD, patient already on PPI and counseled to increase dose to twice daily if she continues to have issues.  She was counseled to follow-up with her PCP and to return to the ED for new or worsening symptoms, patient agrees with plan.      FINAL CLINICAL IMPRESSION(S) / ED DIAGNOSES   Final diagnoses:  Nonspecific chest pain  Hiatal hernia     Rx / DC Orders   ED Discharge Orders     None        Note:  This document was prepared using Dragon voice recognition software and may include unintentional dictation errors.   Willo Dunnings, MD 08/01/23 [phone]

## 2023-07-31 NOTE — ED Provider Triage Note (Signed)
 Emergency Medicine Provider Triage Evaluation Note  Gina Williams , a 78 y.o. female  was evaluated in triage.  Pt complains of chest and epigastric pain. No cardiac hx.   Physical Exam  BP (!) 122/54 (BP Location: Left Arm)   Pulse (!) 57   Temp 97.7 F (36.5 C) (Oral)   Resp 16   Ht 5' 3 (1.6 m)   Wt 59 kg   SpO2 95%   BMI 23.03 kg/m  Gen:   Awake, no distress   Resp:  Normal effort  MSK:   Moves extremities without difficulty  Other:    Medical Decision Making  Medically screening exam initiated at 5:36 PM.  Appropriate orders placed.  Gina Williams was informed that the remainder of the evaluation will be completed by another provider, this initial triage assessment does not replace that evaluation, and the importance of remaining in the ED until their evaluation is complete.  CXR, ct abd pelvis w/ contrast, BMP, CBC, lipase, troponin   Sheron Salm, PA-C 07/31/23 1800

## 2023-07-31 NOTE — ED Triage Notes (Addendum)
 Pt c/o epigastric chest pain starting this afternoon.  Pain score 10/10.  Denies associated cardiac symptoms.  Denies cardiac history.  Pt has been taking care of her brother who is admitted upstairs.    Pt reports she has not had her Nexium  for several days until today.

## 2023-08-01 LAB — HEPATIC FUNCTION PANEL
ALT: 12 U/L (ref 0–44)
AST: 17 U/L (ref 15–41)
Albumin: 3.7 g/dL (ref 3.5–5.0)
Alkaline Phosphatase: 56 U/L (ref 38–126)
Bilirubin, Direct: <0.1 mg/dL (ref 0.0–0.2)
Total Bilirubin: 0.7 mg/dL (ref 0.0–1.2)
Total Protein: 6.5 g/dL (ref 6.5–8.1)

## 2023-08-21 ENCOUNTER — Inpatient Hospital Stay: Admitting: Family Medicine

## 2023-08-25 ENCOUNTER — Ambulatory Visit (INDEPENDENT_AMBULATORY_CARE_PROVIDER_SITE_OTHER): Admitting: Family Medicine

## 2023-08-25 ENCOUNTER — Encounter: Payer: Self-pay | Admitting: Family Medicine

## 2023-08-25 VITALS — BP 116/64 | HR 76 | Temp 98.2°F | Ht 63.0 in | Wt 135.1 lb

## 2023-08-25 DIAGNOSIS — K449 Diaphragmatic hernia without obstruction or gangrene: Secondary | ICD-10-CM | POA: Diagnosis not present

## 2023-08-25 DIAGNOSIS — F430 Acute stress reaction: Secondary | ICD-10-CM | POA: Insufficient documentation

## 2023-08-25 DIAGNOSIS — K219 Gastro-esophageal reflux disease without esophagitis: Secondary | ICD-10-CM | POA: Diagnosis not present

## 2023-08-25 DIAGNOSIS — R079 Chest pain, unspecified: Secondary | ICD-10-CM | POA: Insufficient documentation

## 2023-08-25 DIAGNOSIS — I700 Atherosclerosis of aorta: Secondary | ICD-10-CM

## 2023-08-25 DIAGNOSIS — R072 Precordial pain: Secondary | ICD-10-CM

## 2023-08-25 NOTE — Assessment & Plan Note (Signed)
 Noted since 2018 on cxr and also recent abd CT  Categorized as small   Unsure if this is related to recent bout of cp   Taking nexium   Will add pepcid for a month and see if helpful

## 2023-08-25 NOTE — Assessment & Plan Note (Signed)
 Noted on CT abd / also cxr in past  Blood pressure controlled Watching cholesterol  No aneurysm noted in setting of brief cp

## 2023-08-25 NOTE — Patient Instructions (Signed)
 Continue nexium  in the am Add 20 mg of pepcid (famotidine) in the evening before bed -do this for about a month  If you need a prescription let us  know   Avoid food triggers for reflux  Stay hydrated   If you start to have trouble swallowing (feeling like food gets stuck part way down) please make me aware   Stress and acid may  both have played a role in your symptoms  If your chest pain returns - let us  know

## 2023-08-25 NOTE — Assessment & Plan Note (Signed)
 Taking nexium  40 mg daily  Had episode of intense cp 20 minutes Reviewed hospital records, lab results and studies in detail   Has small HH  Watches what she eats  One time -esoph dysphagia with dry biscuit , but only once   Will add H2 blocker / famotidine 20 mg at bedtime for 1 month  Instructed to call if symptoms re occur and would consider GI ref/consideration of EGD

## 2023-08-25 NOTE — Assessment & Plan Note (Signed)
 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

## 2023-08-25 NOTE — Progress Notes (Signed)
 Subjective:    Patient ID: Gina Williams, female    DOB: December 08, 1945, 78 y.o.   MRN: [id number]  HPI  Wt Readings from Last 3 Encounters:  08/25/23 135 lb 2 oz (61.3 kg)  07/31/23 130 lb (59 kg)  07/17/23 133 lb 8 oz (60.6 kg)   23.94 kg/m  Vitals:   08/25/23 1136  BP: 116/64  Pulse: 76  Temp: 98.2 F (36.8 C)  SpO2: 97%   Pt presents for follow up of ER visit on 07/31/23 for  Chest pain   Felt sharp stabbing pain in center of chest when visiting her brother in the hospital (he had sepsis)  All the sudden- severe/ could hardly speak  It rad towards her back  Low in her chest-almost epigastric area  Lasted 20 minutes   No nausea or sweating or shortness of breath  Not exertional (was watching TV)   Had it one time before at home when her mother was alive  Was advised to go to hospital but it passed      Reassuring labs as follows   Admission on 07/31/2023, Discharged on 08/01/2023  Component Date Value Ref Range Status   Sodium 07/31/2023 140  135 - 145 mmol/L Final   Potassium 07/31/2023 3.9  3.5 - 5.1 mmol/L Final   Chloride 07/31/2023 110  98 - 111 mmol/L Final   CO2 07/31/2023 21 (L)  22 - 32 mmol/L Final   Glucose, Bld 07/31/2023 91  70 - 99 mg/dL Final   Glucose reference range applies only to samples taken after fasting for at least 8 hours.   BUN 07/31/2023 19  8 - 23 mg/dL Final   Creatinine, Ser 07/31/2023 0.67  0.44 - 1.00 mg/dL Final   Calcium 92/88/7974 9.2  8.9 - 10.3 mg/dL Final   GFR, Estimated 07/31/2023 >60  >60 mL/min Final   Comment: (NOTE) Calculated using the CKD-EPI Creatinine Equation (2021)    Anion gap 07/31/2023 9  5 - 15 Final   Performed at [facility], [address] [place], [place] [address]   WBC 07/31/2023 8.4  4.0 - 10.5 K/uL Final   RBC 07/31/2023 4.97  3.87 - 5.11 MIL/uL Final   Hemoglobin 07/31/2023 14.0  12.0 - 15.0 g/dL Final   HCT 92/88/7974 44.2  36.0 - 46.0 % Final   MCV 07/31/2023 88.9  80.0 -  100.0 fL Final   MCH 07/31/2023 28.2  26.0 - 34.0 pg Final   MCHC 07/31/2023 31.7  30.0 - 36.0 g/dL Final   RDW 92/88/7974 12.7  11.5 - 15.5 % Final   Platelets 07/31/2023 274  150 - 400 K/uL Final   nRBC 07/31/2023 0.0  0.0 - 0.2 % Final   Performed at [facility], [address] [place], [place] [address]   Troponin I (High Sensitivity) 07/31/2023 5  <18 ng/L Final   Comment: (NOTE) Elevated high sensitivity troponin I (hsTnI) values and significant  changes across serial measurements may suggest ACS but many other  chronic and acute conditions are known to elevate hsTnI results.  Refer to the Links section for chest pain algorithms and additional  guidance. Performed at [facility], [address] [place], [place] [address]    Lipase 07/31/2023 33  11 - 51 U/L Final   Performed at [facility], [address] [place], [place] [address]   Troponin I (High Sensitivity) 07/31/2023 6  <18 ng/L Final   Comment: (NOTE) Elevated high  sensitivity troponin I (hsTnI) values and significant  changes across serial measurements may suggest ACS but many other  chronic and acute conditions are known to elevate hsTnI results.  Refer to the Links section for chest pain algorithms and additional  guidance. Performed at [facility], [address] [place], [place] [address]    Total Protein 07/31/2023 6.5  6.5 - 8.1 g/dL Final   Albumin 92/88/7974 3.7  3.5 - 5.0 g/dL Final   AST 92/88/7974 17  15 - 41 U/L Final   ALT 07/31/2023 12  0 - 44 U/L Final   Alkaline Phosphatase 07/31/2023 56  38 - 126 U/L Final   Total Bilirubin 07/31/2023 0.7  0.0 - 1.2 mg/dL Final   Bilirubin, Direct 07/31/2023 <0.1  0.0 - 0.2 mg/dL Final   Indirect Bilirubin 07/31/2023 NOT CALCULATED  0.3 - 0.9 mg/dL Final   Performed at [facility], [address] [place], [place] [address]    EKG noted NSR with LBBB   Imaging also reassuring  CT ABDOMEN PELVIS W  CONTRAST Result Date: 07/31/2023 CLINICAL DATA:  Epigastric pain EXAM: CT ABDOMEN AND PELVIS WITH CONTRAST TECHNIQUE: Multidetector CT imaging of the abdomen and pelvis was performed using the standard protocol following bolus administration of intravenous contrast. RADIATION DOSE REDUCTION: This exam was performed according to the departmental dose-optimization program which includes automated exposure control, adjustment of the mA and/or kV according to patient size and/or use of iterative reconstruction technique. CONTRAST:  OMNIPAQUE  IOHEXOL  300 MG/ML  SOLN COMPARISON:  CT abdomen and pelvis dated 09/01/2007 FINDINGS: Lower chest: No focal consolidation or pulmonary nodule in the lung bases. No pleural effusion or pneumothorax demonstrated. Partially imaged heart size is normal. Hepatobiliary: Subcentimeter segment 7/8 hypodensity (2:13), too small to characterize. No intra or extrahepatic biliary ductal dilation. Normal gallbladder. Pancreas: No focal lesions or main ductal dilation. Spleen: Normal in size without focal abnormality. Adrenals/Urinary Tract: No adrenal nodules. Asymmetrically smaller left kidney. No suspicious renal mass, calculi or hydronephrosis. Punctate calcification appears adjacent to but not within the distal right ureter (2:65), likely a phlebolith. No focal bladder wall thickening. Stomach/Bowel: Small hiatal hernia. Normal appearance of the stomach. No evidence of bowel wall thickening, distention, or inflammatory changes. Normal appendix. Vascular/Lymphatic: Aortic atherosclerosis. No enlarged abdominal or pelvic lymph nodes. Reproductive: No adnexal masses. Other: No free fluid, fluid collection, or free air. Musculoskeletal: No acute or abnormal lytic or blastic osseous lesions. Multilevel degenerative changes of the partially imaged thoracic and lumbar spine. Right sacral Tarlov cyst. IMPRESSION: 1. No acute abdominopelvic findings. 2. Small hiatal hernia. 3.  Aortic  Atherosclerosis (ICD10-I70.0). Electronically Signed   By: Limin  Xu M.D.   On: 07/31/2023 18:45   DG Chest 2 View Result Date: 07/31/2023 CLINICAL DATA:  Chest pain. EXAM: CHEST - 2 VIEW COMPARISON:  July 17, 2023. FINDINGS: The heart size and mediastinal contours are within normal limits. Both lungs are clear. The visualized skeletal structures are unremarkable. IMPRESSION: No active cardiopulmonary disease. Electronically Signed   By: Lynwood Landy Raddle M.D.   On: 07/31/2023 17:56   Already on ppi for GERD  Nexium  40 mg daily   No heartburn lately  Had missed one pill of nexium  (day before)  Had eaten a salad for lunch and eaten some hospital food   Lab Results  Component Value Date   CHOL 181 03/27/2023   HDL 45.40 03/27/2023   LDLCALC 106 (H) 03/27/2023   LDLDIRECT 156.2 10/27/2012  TRIG 148.0 03/27/2023   CHOLHDL 4 03/27/2023   Notes she was under a lot of stress Improved now that brother was moved to the same nursing home her other brother is in  So much better   Sleep is fair  Does not stay asleep long enough- used to that     Patient Active Problem List   Diagnosis Date Noted   Chest pain 08/25/2023   Stress reaction 08/25/2023   Hiatal hernia 08/25/2023   Axillary pain, right 07/17/2023   Allergic rhinitis 04/03/2023   Constipation 06/24/2022   Vitamin B12 deficiency 06/24/2022   Current use of proton pump inhibitor 04/02/2022   Right hip pain 03/19/2022   Sleep disorder 03/19/2022   Fatigue 06/21/2019   Parent refuses immunizations 06/29/2018   Prediabetes 06/20/2018   Cystocele with prolapse 09/22/2017   S/P hysterectomy 10/23/2016   Atherosclerosis of aorta (HCC) 09/23/2016   Estrogen deficiency 12/01/2014   Osteopenia 11/28/2013   Hyperlipidemia 11/03/2012   GERD 01/23/2010   History of breast cancer 01/23/2010   Past Medical History:  Diagnosis Date   Arthritis    hands   Breast cancer (HCC) 09/16/2007   Mastectomy   Dyspnea    last 5 months,  lack of conditioning   GERD (gastroesophageal reflux disease)    Hypoglycemic reaction    fainted in her youth , hadnt eaten anything for hours and was assisting with a 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 BMD   Personal history of chemotherapy    PONV (postoperative nausea and vomiting)    SEVERE, patient is very concerned about this issue occurred with mastectomy sugery ; no issues whn she had hysterectomy    Vertigo 2007   Past Surgical History:  Procedure Laterality Date   ANTERIOR AND POSTERIOR REPAIR N/A 09/22/2017   Procedure: CYSTOSCOPY ANTERIOR (CYSTOCELE);  Surgeon: Gaston Hamilton, MD;  Location: [facility];  Service: Urology;  Laterality: N/A;   BASAL CELL CARCINOMA EXCISION N/A 12/03/2020   CATARACT EXTRACTION, BILATERAL     COLONOSCOPY     CYSTOSCOPY N/A 10/23/2016   Procedure: CYSTOSCOPY;  Surgeon: Kandyce Sor, MD;  Location: [facility];  Service: Gynecology;  Laterality: N/A;   GANGLION CYST EXCISION Left 1970s   Multiple surgeries   HAND SURGERY Left 10/2005   Nerve damage   HAND SURGERY Left 2011   Torn ligament surgery   MASTECTOMY  08/2007   Breast Cancer   Nuclear Stress Test     Negative  EF 77%   pap  08/05/2014   [facility]   pessary  12/19/2015   [facility] Dr. Kandyce   ROBOTIC ASSISTED TOTAL HYSTERECTOMY WITH BILATERAL SALPINGO OOPHERECTOMY Bilateral 10/23/2016   Procedure: ROBOTIC ASSISTED TOTAL HYSTERECTOMY WITH BILATERAL SALPINGO OOPHORECTOMY/Uterosacral Ligament Suspension;  Surgeon: Kandyce Sor, MD;  Location: [facility];  Service: Gynecology;  Laterality: Bilateral;   SHOULDER SURGERY Bilateral Late 1980s   Frozen shoulders bilaterally, no surgery, had physical therapy   Stress myoview  12/2003   Low risk   TONSILLECTOMY  Late 1970s   TUBAL LIGATION     VAGINAL PROLAPSE REPAIR N/A 09/22/2017   Procedure: VAGINAL VAULT SUSPENSION AND GRAFT;  Surgeon: Gaston Hamilton, MD;  Location: [facility];  Service: Urology;  Laterality: N/A;   YAG LASER APPLICATION Bilateral 04/24/2020   Social History   Tobacco Use   Smoking status: Former  Current packs/day: 0.00    Types: Cigarettes    Quit date: 05/21/1998    Years since quitting: 25.2   Smokeless tobacco: Never  Vaping Use   Vaping status: Never Used  Substance Use Topics   Alcohol use: No    Alcohol/week: 0.0 standard drinks of alcohol   Drug use: No   Family History  Problem Relation Age of Onset   Asthma Mother    Aortic aneurysm Mother    Stroke Father    Alzheimer's disease Father    Stroke Maternal Uncle    Breast cancer Paternal Aunt 31 - 13   Breast cancer Paternal Aunt 36 - 11   Breast cancer Paternal Aunt 73 - 59   Hypertension Other        HTN on Father's side of family   Allergies  Allergen Reactions   Amoxicillin -Pot Clavulanate Nausea Only and Other (See Comments)    REACTION: nausea (tolerates amoxil ) Has patient had a PCN reaction causing immediate rash, facial/tongue/throat swelling, SOB or lightheadedness with hypotension: No Has patient had a PCN reaction causing severe rash involving mucus membranes or skin necrosis: Unknown Has patient had a PCN reaction that required hospitalization: No Has patient had a PCN reaction occurring within the last 10 years: No If all of the above answers are NO, then may proceed with Cephalosporin use.    Omeprazole Other (See Comments)    ineffective    Other Nausea And Vomiting and Other (See Comments)    Anesthesia-vomiting/dry heaves (up to ~12hrs after)   Current Outpatient Medications on File Prior to Visit  Medication Sig Dispense Refill   acetaminophen  (TYLENOL ) 500 MG tablet Take 500 mg by mouth every 6 (six) hours as needed for moderate pain or headache.      Cholecalciferol (VITAMIN D3 PO) Take 2,000 Units by mouth daily.      cyanocobalamin  (VITAMIN B12) 1000 MCG tablet Take 1,000 mcg by mouth daily.      diphenhydrAMINE (BENADRYL) 25 mg capsule Take 25 mg by mouth at bedtime.     esomeprazole  (NEXIUM ) 40 MG capsule Take 1 capsule (40 mg total) by mouth daily. 90 capsule 3   famotidine (PEPCID) 20 MG tablet Take 20 mg by mouth at bedtime.     fluticasone  (FLONASE ) 50 MCG/ACT nasal spray USE 2 SPRAY(S) IN EACH NOSTRIL ONCE DAILY AS NEEDED FOR  ALLERGIES  OR  RHINITIS 48 g 3   melatonin 5 MG TABS Take 5 mg by mouth at bedtime as needed.     Omega-3 Fatty Acids (FISH OIL PO) Take 1,900 mg by mouth daily.     Polyethyl Glycol-Propyl Glycol (SYSTANE) 0.4-0.3 % SOLN Apply to eye.     polyethylene glycol (MIRALAX / GLYCOLAX) 17 g packet Take 17 g by mouth daily.     No current facility-administered medications on file prior to visit.    Review of Systems  Constitutional:  Negative for activity change, appetite change, fatigue, fever and unexpected weight change.  HENT:  Negative for congestion, ear pain, rhinorrhea, sinus pressure and sore throat.        One episode of esophageal slow swallowing-never re occurred   Eyes:  Negative for pain, redness and visual disturbance.  Respiratory:  Negative for cough, shortness of breath, wheezing and stridor.   Cardiovascular:  Negative for chest pain and palpitations.       Chest pain is resolved   Gastrointestinal:  Negative for abdominal pain, blood in stool, constipation and diarrhea.  Endocrine: Negative for polydipsia and polyuria.  Genitourinary:  Negative for dysuria, frequency and urgency.  Musculoskeletal:  Negative for arthralgias, back pain and myalgias.  Skin:  Negative for pallor and rash.  Allergic/Immunologic: Negative for environmental allergies.  Neurological:  Negative for dizziness, syncope and headaches.  Hematological:  Negative for adenopathy. Does not bruise/bleed easily.  Psychiatric/Behavioral:  Negative for decreased concentration and dysphoric mood. The patient is nervous/anxious.        Very high stress has decreased Feeling  better and less anxious        Objective:   Physical Exam Constitutional:      General: She is not in acute distress.    Appearance: Normal appearance. She is well-developed and normal weight. She is not ill-appearing or diaphoretic.  HENT:     Head: Normocephalic and atraumatic.  Eyes:     Conjunctiva/sclera: Conjunctivae normal.     Pupils: Pupils are equal, round, and reactive to light.  Neck:     Thyroid : No thyromegaly.     Vascular: No carotid bruit or JVD.  Cardiovascular:     Rate and Rhythm: Normal rate and regular rhythm.     Heart sounds: Normal heart sounds.     No gallop.  Pulmonary:     Effort: Pulmonary effort is normal. No respiratory distress.     Breath sounds: Normal breath sounds. No wheezing or rales.  Abdominal:     General: Abdomen is flat. Bowel sounds are normal. There is no distension or abdominal bruit.     Palpations: Abdomen is soft. There is no fluid wave, hepatomegaly, splenomegaly, mass or pulsatile mass.     Tenderness: There is abdominal tenderness in the epigastric area. There is no guarding or rebound. Negative signs include Murphy's sign.     Comments: Mild epigastric tenderness   Musculoskeletal:     Cervical back: Normal range of motion and neck supple.     Right lower leg: No edema.     Left lower leg: No edema.  Lymphadenopathy:     Cervical: No cervical adenopathy.  Skin:    General: Skin is warm and dry.     Coloration: Skin is not pale.     Findings: No rash.  Neurological:     Mental Status: She is alert.     Coordination: Coordination normal.     Deep Tendon Reflexes: Reflexes are normal and symmetric. Reflexes normal.  Psychiatric:        Mood and Affect: Mood normal.           Assessment & Plan:   Problem List Items Addressed This Visit       Cardiovascular and Mediastinum   Atherosclerosis of aorta (HCC)   Noted on CT abd / also cxr in past  Blood pressure controlled Watching cholesterol  No aneurysm noted  in setting of brief cp          Respiratory   Hiatal hernia   Noted since 2018 on cxr and also recent abd CT  Categorized as small   Unsure if this is related to recent bout of cp   Taking nexium   Will add pepcid for a month and see if helpful         Digestive   GERD   Taking nexium  40 mg daily  Had episode of intense cp 20 minutes Reviewed hospital records, lab results and studies in detail   Has small HH  Watches what she eats  One time -esoph dysphagia  with dry biscuit , but only once   Will add H2 blocker / famotidine 20 mg at bedtime for 1 month  Instructed to call if symptoms re occur and would consider GI ref/consideration of EGD      Relevant Medications   famotidine (PEPCID) 20 MG tablet     Other   Stress reaction   Caring for brother in hospital with sepsis  Another disabled brother   Thankfully situation is improved (both in same nsg facility now) 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  Feels much better   Encouraged to reach out if worse again or needing help  Mood is good today      Chest pain - Primary   20 minute episode in setting of very high stress  Reviewed hospital records, lab results and studies in detail   Reassuring work up  Noted baseline aortic atherosclerosis but no aneurysmal change  Noted small baseline HH   Suspect possible acid related event (had epigastric pain) / esophageal spasm Stress is better now  Instructed to continue nexium  40 mg daily in am For 1 mo will add famotidine 20 mg at bedtime Instructed to call if return of cp or any swallowing issues   Hyoscyamine or isosorbide may be options if esoph dysmotility or spasm are suspected in future along with GI eval/ EGD  Call back and Er precautions noted in detail today   Low threshold for specialty consult

## 2023-08-25 NOTE — Assessment & Plan Note (Addendum)
 20 minute episode in setting of very high stress  Reviewed hospital records, lab results and studies in detail   Reassuring work up  Noted baseline aortic atherosclerosis but no aneurysmal change  Noted small baseline HH   Suspect possible acid related event (had epigastric pain) / esophageal spasm Stress is better now  Instructed to continue nexium  40 mg daily in am For 1 mo will add famotidine 20 mg at bedtime Instructed to call if return of cp or any swallowing issues   Hyoscyamine or isosorbide may be options if esoph dysmotility or spasm are suspected in future along with GI eval/ EGD  Call back and Er precautions noted in detail today   Low threshold for specialty consult

## 2023-09-29 ENCOUNTER — Telehealth: Payer: Self-pay | Admitting: *Deleted

## 2023-09-29 DIAGNOSIS — K219 Gastro-esophageal reflux disease without esophagitis: Secondary | ICD-10-CM

## 2023-09-29 DIAGNOSIS — R1319 Other dysphagia: Secondary | ICD-10-CM | POA: Insufficient documentation

## 2023-09-29 DIAGNOSIS — R072 Precordial pain: Secondary | ICD-10-CM

## 2023-09-29 NOTE — Telephone Encounter (Signed)
 Copied from CRM #[id number]. Topic: Clinical - Medication Question >> Sep 29, 2023  2:36 PM Chiquita SQUIBB wrote: Reason for CRM: Patient is calling stating that she has been taking the famotidine (PEPCID) 20 MG tablet and she is still having the pain patient tried to stop taking it and it did got worse so she started taking it again, and is going to continue taking it for now, until told otherwise. Patient is asking what she should do.

## 2023-09-29 NOTE — Telephone Encounter (Signed)
 Thanks for letting me know  I want to refer to GI if she is ok with that

## 2023-09-29 NOTE — Telephone Encounter (Signed)
 I put the referral in for GI Please let us  know if you don't hear in 1-2 weeks to set that up (mychart message or call or letter)

## 2023-09-29 NOTE — Telephone Encounter (Signed)
 Pt notified of Dr. Graham comments. Pt does want to be referred back to [facility], Dr. Rosalie did her colonoscopy and she would like to see him. Pt advised PCP will place referral and if she doesn't get a notification within 2 weeks to let us  know

## 2023-10-26 DIAGNOSIS — K2100 Gastro-esophageal reflux disease with esophagitis, without bleeding: Secondary | ICD-10-CM | POA: Diagnosis not present

## 2023-10-26 DIAGNOSIS — R0789 Other chest pain: Secondary | ICD-10-CM | POA: Diagnosis not present

## 2023-12-09 DIAGNOSIS — K2100 Gastro-esophageal reflux disease with esophagitis, without bleeding: Secondary | ICD-10-CM | POA: Diagnosis not present

## 2023-12-28 DIAGNOSIS — Z85828 Personal history of other malignant neoplasm of skin: Secondary | ICD-10-CM | POA: Diagnosis not present

## 2023-12-28 DIAGNOSIS — L738 Other specified follicular disorders: Secondary | ICD-10-CM | POA: Diagnosis not present

## 2023-12-28 DIAGNOSIS — L918 Other hypertrophic disorders of the skin: Secondary | ICD-10-CM | POA: Diagnosis not present

## 2023-12-28 DIAGNOSIS — L720 Epidermal cyst: Secondary | ICD-10-CM | POA: Diagnosis not present

## 2023-12-28 DIAGNOSIS — L578 Other skin changes due to chronic exposure to nonionizing radiation: Secondary | ICD-10-CM | POA: Diagnosis not present

## 2023-12-28 DIAGNOSIS — D1801 Hemangioma of skin and subcutaneous tissue: Secondary | ICD-10-CM | POA: Diagnosis not present

## 2023-12-28 DIAGNOSIS — D485 Neoplasm of uncertain behavior of skin: Secondary | ICD-10-CM | POA: Diagnosis not present

## 2023-12-28 DIAGNOSIS — L570 Actinic keratosis: Secondary | ICD-10-CM | POA: Diagnosis not present

## 2024-01-18 ENCOUNTER — Other Ambulatory Visit: Payer: Self-pay | Admitting: Family Medicine

## 2024-01-18 DIAGNOSIS — Z1231 Encounter for screening mammogram for malignant neoplasm of breast: Secondary | ICD-10-CM

## 2024-02-02 ENCOUNTER — Encounter: Payer: Self-pay | Admitting: Family

## 2024-02-02 ENCOUNTER — Ambulatory Visit: Admitting: Family

## 2024-02-02 VITALS — BP 122/70 | HR 94 | Temp 99.0°F | Ht 62.5 in | Wt 132.4 lb

## 2024-02-02 DIAGNOSIS — J029 Acute pharyngitis, unspecified: Secondary | ICD-10-CM | POA: Diagnosis not present

## 2024-02-02 DIAGNOSIS — H66001 Acute suppurative otitis media without spontaneous rupture of ear drum, right ear: Secondary | ICD-10-CM

## 2024-02-02 DIAGNOSIS — H60311 Diffuse otitis externa, right ear: Secondary | ICD-10-CM | POA: Diagnosis not present

## 2024-02-02 LAB — POCT RAPID STREP A (OFFICE): Rapid Strep A Screen: NEGATIVE

## 2024-02-02 MED ORDER — OFLOXACIN 0.3 % OT SOLN: 5.0000 [drp] | Freq: Every day | OTIC | 0 refills | Status: AC

## 2024-02-02 MED ORDER — AMOXICILLIN-POT CLAVULANATE 875-125 MG PO TABS: 1.0000 | ORAL_TABLET | Freq: Two times a day (BID) | ORAL | 0 refills | Status: DC

## 2024-02-02 NOTE — Progress Notes (Signed)
 "  Established Patient Office Visit  Subjective:      CC:  Chief Complaint  Patient presents with   Acute Visit    Ear pain and sore throat    HPI: Gina Williams is a 79 y.o. female presenting on 02/02/2024 for Acute Visit (Ear pain and sore throat) .  Discussed the use of AI scribe software for clinical note transcription with the patient, who gave verbal consent to proceed.  History of Present Illness Gina Williams is a 79 year old female who presents with right ear pain and facial tenderness.  She began experiencing symptoms four days ago with a shooting pain in her right ear that radiates to her eyeball. The pain is sometimes constant and dull but can become shooting when she swallows or closes her eyes. She has had similar symptoms in the past, notably last year on the left side.  No fever is present, but she has sinus drainage and a sore throat on the right side. No headaches or blurry vision are reported.  She recalls being treated with Augmentin  and ofloxacin  drops for similar symptoms in the past, which she tolerated well despite occasional nausea. She is concerned about the cost of medications, mentioning a previous experience with expensive amoxicillin .  She visits a nursing home daily to feed her brother and is concerned about the potential for contagion, although she has not had a fever or cough.         Social history:  Relevant past medical, surgical, family and social history reviewed and updated as indicated. Interim medical history since our last visit reviewed.  Allergies and medications reviewed and updated.  DATA REVIEWED: CHART IN EPIC     ROS: Negative unless specifically indicated above in HPI.   Current Medications[1]        Objective:        BP 122/70 (BP Location: Left Arm, Patient Position: Sitting, Cuff Size: Normal)   Pulse 94   Temp 99 F (37.2 C) (Temporal)   Ht 5' 2.5 (1.588 m)   Wt 132 lb 6.4 oz (60.1  kg)   SpO2 98%   BMI 23.83 kg/m   Physical Exam HEENT: Swelling in the right ear. NECK: Slight lymph node tenderness on the right side of the neck.  Wt Readings from Last 3 Encounters:  02/02/24 132 lb 6.4 oz (60.1 kg)  08/25/23 135 lb 2 oz (61.3 kg)  07/31/23 130 lb (59 kg)    Physical Exam Vitals reviewed.  Constitutional:      General: She is not in acute distress.    Appearance: Normal appearance. She is normal weight. She is not ill-appearing, toxic-appearing or diaphoretic.  HENT:     Head: Normocephalic.     Right Ear: Tenderness present. Tympanic membrane is bulging.     Left Ear: Tympanic membrane normal.     Ears:     Comments: Right canal edema and tenderness    Nose: Nose normal.     Mouth/Throat:     Mouth: Mucous membranes are dry.     Pharynx: No oropharyngeal exudate or posterior oropharyngeal erythema.  Eyes:     Extraocular Movements: Extraocular movements intact.     Pupils: Pupils are equal, round, and reactive to light.  Cardiovascular:     Rate and Rhythm: Normal rate and regular rhythm.     Pulses: Normal pulses.     Heart sounds: Normal heart sounds.  Pulmonary:     Effort: Pulmonary effort  is normal.     Breath sounds: Normal breath sounds.  Musculoskeletal:     Cervical back: Normal range of motion.  Neurological:     General: No focal deficit present.     Mental Status: She is alert and oriented to person, place, and time. Mental status is at baseline.  Psychiatric:        Mood and Affect: Mood normal.        Behavior: Behavior normal.        Thought Content: Thought content normal.        Judgment: Judgment normal.          Results   Assessment & Plan:   Assessment and Plan Assessment & Plan Acute otitis externa and otitis media of right ear Acute otitis externa and otitis media of the right ear with shooting pain radiating to the eye, tenderness, and swelling. Symptoms began four days ago with significant pain affecting  sleep. No fever reported. Similar episode occurred last year on the left side. Examination reveals swelling in the ear and tenderness on the right side. Likely bacterial etiology given the presentation and history. - Prescribed ofloxacin  ear drops for otitis externa. - Prescribed amoxicillin  for otitis media.  Acute pharyngitis Right-sided sore throat, tenderness in the right jaw, and slight lymph node tenderness. No fever, headache, or blurry vision.        Return for f/u PCP if no improvement in symptoms.     Ginger Patrick, MSN, APRN, FNP-C [facility]        [1]  Current Outpatient Medications:    acetaminophen  (TYLENOL ) 500 MG tablet, Take 500 mg by mouth every 6 (six) hours as needed for moderate pain or headache. , Disp: , Rfl:    amoxicillin -clavulanate (AUGMENTIN ) 875-125 MG tablet, Take 1 tablet by mouth 2 (two) times daily., Disp: 20 tablet, Rfl: 0   Cholecalciferol (VITAMIN D3 PO), Take 2,000 Units by mouth daily. , Disp: , Rfl:    cyanocobalamin  (VITAMIN B12) 1000 MCG tablet, Take 1,000 mcg by mouth daily., Disp: , Rfl:    diphenhydrAMINE (BENADRYL) 25 mg capsule, Take 25 mg by mouth at bedtime., Disp: , Rfl:    esomeprazole  (NEXIUM ) 40 MG capsule, Take 1 capsule (40 mg total) by mouth daily., Disp: 90 capsule, Rfl: 3   famotidine (PEPCID) 20 MG tablet, Take 20 mg by mouth at bedtime., Disp: , Rfl:    fluticasone  (FLONASE ) 50 MCG/ACT nasal spray, USE 2 SPRAY(S) IN EACH NOSTRIL ONCE DAILY AS NEEDED FOR  ALLERGIES  OR  RHINITIS, Disp: 48 g, Rfl: 3   melatonin 5 MG TABS, Take 5 mg by mouth at bedtime as needed., Disp: , Rfl:    ofloxacin  (FLOXIN ) 0.3 % OTIC solution, Place 5 drops into the right ear daily for 7 days., Disp: 5 mL, Rfl: 0   Omega-3 Fatty Acids (FISH OIL PO), Take 1,900 mg by mouth daily., Disp: , Rfl:    Polyethyl Glycol-Propyl Glycol (SYSTANE) 0.4-0.3 % SOLN, Apply to eye., Disp: , Rfl:    polyethylene glycol (MIRALAX /  GLYCOLAX) 17 g packet, Take 17 g by mouth daily., Disp: , Rfl:   "

## 2024-02-09 ENCOUNTER — Ambulatory Visit

## 2024-02-11 ENCOUNTER — Ambulatory Visit

## 2024-02-12 ENCOUNTER — Ambulatory Visit: Payer: Self-pay

## 2024-02-12 ENCOUNTER — Ambulatory Visit: Admitting: Family Medicine

## 2024-02-12 ENCOUNTER — Ambulatory Visit (INDEPENDENT_AMBULATORY_CARE_PROVIDER_SITE_OTHER): Admission: RE | Admit: 2024-02-12 | Source: Ambulatory Visit

## 2024-02-12 ENCOUNTER — Ambulatory Visit: Payer: Self-pay | Admitting: Family Medicine

## 2024-02-12 VITALS — BP 100/60 | HR 82 | Temp 99.0°F | Ht 62.5 in | Wt 134.0 lb

## 2024-02-12 DIAGNOSIS — R509 Fever, unspecified: Secondary | ICD-10-CM

## 2024-02-12 DIAGNOSIS — R051 Acute cough: Secondary | ICD-10-CM

## 2024-02-12 DIAGNOSIS — J101 Influenza due to other identified influenza virus with other respiratory manifestations: Secondary | ICD-10-CM | POA: Diagnosis not present

## 2024-02-12 DIAGNOSIS — K2100 Gastro-esophageal reflux disease with esophagitis, without bleeding: Secondary | ICD-10-CM | POA: Insufficient documentation

## 2024-02-12 LAB — POC INFLUENZA A&B (BINAX/QUICKVUE)
Influenza A, POC: POSITIVE — AB
Influenza B, POC: NEGATIVE

## 2024-02-12 MED ORDER — PREDNISONE 10 MG PO TABS: ORAL_TABLET | ORAL | 0 refills | Status: AC

## 2024-02-12 MED ORDER — BENZONATATE 200 MG PO CAPS: 200.0000 mg | ORAL_CAPSULE | Freq: Two times a day (BID) | ORAL | 0 refills | Status: AC | PRN

## 2024-02-12 MED ORDER — OSELTAMIVIR PHOSPHATE 75 MG PO CAPS: 75.0000 mg | ORAL_CAPSULE | Freq: Two times a day (BID) | ORAL | 0 refills | Status: AC

## 2024-02-12 NOTE — Progress Notes (Signed)
 "   Patient ID: Gina Williams, female    DOB: 08-08-1945, 79 y.o.   MRN: [id number]  This visit was conducted in person.  BP 100/60   Pulse 82   Temp 99 F (37.2 C) (Oral)   Ht 5' 2.5 (1.588 m)   Wt 134 lb (60.8 kg)   SpO2 95%   BMI 24.12 kg/m    CC:  Chief Complaint  Patient presents with   Sinus Drainage    Started Tuesday-Seen by Dugal 02/02/24   Cough    Dry-Rattling in Chest when she lays down   Trouble Sleeping    Subjective:   HPI: Gina Williams is a 79 y.o. female presenting on 02/12/2024 for Sinus Drainage (Started Tuesday-Seen by Dugal 02/02/24), Cough (Dry-Rattling in Chest when she lays down), and Trouble Sleeping   PCP: [facility] office visit on February 02, 2024 with Ginger Patrick, NP Diagnosed with acute otitis externa and otitis media of right ear. Treated with ofloxacin  eardrops as well as Augmentin  twice daily x 10 days Negative strep testing  She reports that she  has had resolution of ear and throat issue.  Felt almost 100 better.   Now in last 3 days had an episode of  emesis  Post nasal drip.  Dry cough, no  productive. Symptoms progressed to dry rattling sound in chest when lying down.  Mild SOB.  Cough keeping her up at night. Occ wheeze.  Some body aches.   Fever 102.0 F 2 days ago.. 98-101 since   Sick contacts:  no no exposure.. has been to 2 funerals COVID testing:    negative x 2     She has tried to treat with  tylenol .     No history of chronic lung disease such as asthma or COPD. Former smoker.       Relevant past medical, surgical, family and social history reviewed and updated as indicated. Interim medical history since our last visit reviewed. Allergies and medications reviewed and updated. Outpatient Medications Prior to Visit  Medication Sig Dispense Refill   acetaminophen  (TYLENOL ) 500 MG tablet Take 500 mg by mouth every 6 (six) hours as needed for moderate pain or headache.      Cholecalciferol  (VITAMIN D3 PO) Take 2,000 Units by mouth daily.      cyanocobalamin  (VITAMIN B12) 1000 MCG tablet Take 1,000 mcg by mouth daily.     diphenhydrAMINE (BENADRYL) 25 mg capsule Take 25 mg by mouth at bedtime.     famotidine (PEPCID) 20 MG tablet Take 20 mg by mouth at bedtime.     fluticasone  (FLONASE ) 50 MCG/ACT nasal spray USE 2 SPRAY(S) IN EACH NOSTRIL ONCE DAILY AS NEEDED FOR  ALLERGIES  OR  RHINITIS 48 g 3   melatonin 5 MG TABS Take 5 mg by mouth at bedtime as needed.     Omega-3 Fatty Acids (FISH OIL PO) Take 1,900 mg by mouth daily.     pantoprazole  (PROTONIX ) 40 MG tablet Take 40 mg by mouth daily.     Polyethyl Glycol-Propyl Glycol (SYSTANE) 0.4-0.3 % SOLN Apply to eye.     polyethylene glycol (MIRALAX / GLYCOLAX) 17 g packet Take 17 g by mouth daily.     amoxicillin -clavulanate (AUGMENTIN ) 875-125 MG tablet Take 1 tablet by mouth 2 (two) times daily. 20 tablet 0   esomeprazole  (NEXIUM ) 40 MG capsule Take 1 capsule (40 mg total) by mouth daily. 90 capsule 3   No facility-administered medications prior to visit.  Per HPI unless specifically indicated in ROS section below Review of Systems  Constitutional:  Positive for fatigue and fever.  HENT:  Positive for congestion.   Eyes:  Negative for pain.  Respiratory:  Positive for cough. Negative for shortness of breath.   Cardiovascular:  Negative for chest pain, palpitations and leg swelling.  Gastrointestinal:  Negative for abdominal pain.  Genitourinary:  Negative for dysuria and vaginal bleeding.  Musculoskeletal:  Negative for back pain.  Neurological:  Negative for syncope, light-headedness and headaches.  Psychiatric/Behavioral:  Negative for dysphoric mood.    Objective:  BP 100/60   Pulse 82   Temp 99 F (37.2 C) (Oral)   Ht 5' 2.5 (1.588 m)   Wt 134 lb (60.8 kg)   SpO2 95%   BMI 24.12 kg/m   Wt Readings from Last 3 Encounters:  02/12/24 134 lb (60.8 kg)  02/02/24 132 lb 6.4 oz (60.1 kg)  08/25/23 135 lb 2 oz  (61.3 kg)      Physical Exam Constitutional:      General: She is not in acute distress.    Appearance: She is well-developed. She is not ill-appearing or toxic-appearing.  HENT:     Head: Normocephalic.     Right Ear: Hearing, tympanic membrane, ear canal and external ear normal. Tympanic membrane is not erythematous, retracted or bulging.     Left Ear: Hearing, tympanic membrane, ear canal and external ear normal. Tympanic membrane is not erythematous, retracted or bulging.     Nose: Mucosal edema and congestion present. No rhinorrhea.     Right Sinus: No maxillary sinus tenderness or frontal sinus tenderness.     Left Sinus: No maxillary sinus tenderness or frontal sinus tenderness.     Mouth/Throat:     Pharynx: Uvula midline.  Eyes:     General: Lids are normal. Lids are everted, no foreign bodies appreciated.     Conjunctiva/sclera: Conjunctivae normal.     Pupils: Pupils are equal, round, and reactive to light.  Neck:     Thyroid : No thyroid  mass or thyromegaly.     Vascular: No carotid bruit.     Trachea: Trachea normal.  Cardiovascular:     Rate and Rhythm: Normal rate and regular rhythm.     Pulses: Normal pulses.     Heart sounds: Normal heart sounds, S1 normal and S2 normal. No murmur heard.    No friction rub. No gallop.  Pulmonary:     Effort: Pulmonary effort is normal. No tachypnea or respiratory distress.     Breath sounds: Rhonchi present. No decreased breath sounds, wheezing or rales.  Musculoskeletal:     Cervical back: Normal range of motion and neck supple.  Skin:    General: Skin is warm and dry.     Findings: No rash.  Neurological:     Mental Status: She is alert.  Psychiatric:        Mood and Affect: Mood is not anxious or depressed.        Speech: Speech normal.        Behavior: Behavior normal. Behavior is cooperative.        Judgment: Judgment normal.       Results for orders placed or performed in visit on 02/12/24  POC Influenza A&B  (Binax test)   Collection Time: 02/12/24 11:37 AM  Result Value Ref Range   Influenza A, POC Positive (A) Negative   Influenza B, POC Negative Negative    Assessment and Plan  Influenza A Assessment & Plan: Discussed symptomatic care.  Hydration, rest. Call if SOB, cough worsening or prolongued fever. Reviewed flu timeline. She has health problems that put her at risk for complications , so we decided to use of tamiflu . Pt agreed. Discussed family prophylaxis, and prevention of spread. She was advised to not return to public until 24 hour after fever resolved on no antipyretics.   Given she has significant rhonchi on exam chest x-ray was performed to rule out additional bacterial superinfection.  Of note chest x-ray returned showing no evidence of infiltrate. She does have significant evidence of reactive lung disease to the flu.  I will treat her with prednisone  taper and benzonatate as needed.  No current red flags or indication for admission.  Reviewed return and ER precautions with patient in detail.  If severe shortness of breath she will go to the emergency room.    Acute cough -     DG Chest 2 View; Future -     POC Influenza A&B(BINAX/QUICKVUE)  Fever, unspecified fever cause -     DG Chest 2 View; Future -     POC Influenza A&B(BINAX/QUICKVUE)  Other orders -     Benzonatate; Take 1 capsule (200 mg total) by mouth 2 (two) times daily as needed for cough.  Dispense: 20 capsule; Refill: 0 -     predniSONE ; 3 tabs by mouth daily x 3 days, then 2 tabs by mouth daily x 2 days then 1 tab by mouth daily x 2 days  Dispense: 15 tablet; Refill: 0 -     Oseltamivir  Phosphate; Take 1 capsule (75 mg total) by mouth 2 (two) times daily.  Dispense: 10 capsule; Refill: 0    Return if symptoms worsen or fail to improve.   Greig Ring, MD  "

## 2024-02-12 NOTE — Assessment & Plan Note (Signed)
 Discussed symptomatic care.  Hydration, rest. Call if SOB, cough worsening or prolongued fever. Reviewed flu timeline. She has health problems that put her at risk for complications , so we decided to use of tamiflu . Pt agreed. Discussed family prophylaxis, and prevention of spread. She was advised to not return to public until 24 hour after fever resolved on no antipyretics.   Given she has significant rhonchi on exam chest x-ray was performed to rule out additional bacterial superinfection.  Of note chest x-ray returned showing no evidence of infiltrate. She does have significant evidence of reactive lung disease to the flu.  I will treat her with prednisone  taper and benzonatate as needed.  No current red flags or indication for admission.  Reviewed return and ER precautions with patient in detail.  If severe shortness of breath she will go to the emergency room.

## 2024-02-12 NOTE — Telephone Encounter (Signed)
 FYI Only or Action Required?: FYI only for provider: appointment scheduled on this morning.  Patient was last seen in primary care on 02/02/2024 by Corwin Antu, FNP.  Called Nurse Triage reporting Cough.  Symptoms began several days ago.  Interventions attempted: Prescription medications: is currently on antibiotics for ear infections.  Symptoms are: gradually worsening.  Triage Disposition: See HCP Within 4 Hours (Or PCP Triage)  Patient/caregiver understands and will follow disposition?: Yes                            Reason for Triage: In last Tuesday for ear infection and sinus infection thinks it has turned into pneumonia now having some coughing, wheezing, sinus drainage. Asking to have something called in. Asked if any shortness of breath patient states she doesn't know how to answer that   Reason for Disposition  Wheezing is present  Answer Assessment - Initial Assessment Questions 1. ONSET: When did the cough begin?      Sunday or Monday 2. SEVERITY: How bad is the cough today?      moderate 3. SPUTUM: Describe the color of your sputum (e.g., none, dry cough; clear, white, yellow, green)     none 4. HEMOPTYSIS: Are you coughing up any blood? If Yes, ask: How much? (e.g., flecks, streaks, tablespoons, etc.)     no 5. DIFFICULTY BREATHING: Are you having difficulty breathing? If Yes, ask: How bad is it? (e.g., mild, moderate, severe)      wheezing 6. FEVER: Do you have a fever? If Yes, ask: What is your temperature, how was it measured, and when did it start?     Has come and gone 7. CARDIAC HISTORY: Do you have any history of heart disease? (e.g., heart attack, congestive heart failure)      no 8. LUNG HISTORY: Do you have any history of lung disease?  (e.g., pulmonary embolus, asthma, emphysema)     no 9. PE RISK FACTORS: Do you have a history of blood clots? (or: recent major surgery, recent prolonged travel,  bedridden)     no 10. OTHER SYMPTOMS: Do you have any other symptoms? (e.g., runny nose, wheezing, chest pain)       Runny nose, wheezing coughing  Protocols used: Cough - Acute Non-Productive-A-AH

## 2024-03-11 ENCOUNTER — Ambulatory Visit: Payer: Medicare Other
